# Patient Record
Sex: Female | Born: 1955
Health system: Southern US, Community
[De-identification: ages and names within clinical notes are randomized; demographics above are authoritative.]

## PROBLEM LIST (undated history)

## (undated) DIAGNOSIS — M1990 Unspecified osteoarthritis, unspecified site: Secondary | ICD-10-CM

## (undated) DIAGNOSIS — Z720 Tobacco use: Secondary | ICD-10-CM

## (undated) DIAGNOSIS — I639 Cerebral infarction, unspecified: Secondary | ICD-10-CM

## (undated) DIAGNOSIS — I10 Essential (primary) hypertension: Secondary | ICD-10-CM

## (undated) DIAGNOSIS — E785 Hyperlipidemia, unspecified: Secondary | ICD-10-CM

## (undated) DIAGNOSIS — F329 Major depressive disorder, single episode, unspecified: Secondary | ICD-10-CM

## (undated) DIAGNOSIS — K625 Hemorrhage of anus and rectum: Secondary | ICD-10-CM

## (undated) DIAGNOSIS — I219 Acute myocardial infarction, unspecified: Secondary | ICD-10-CM

## (undated) DIAGNOSIS — R112 Nausea with vomiting, unspecified: Secondary | ICD-10-CM

## (undated) DIAGNOSIS — F32A Depression, unspecified: Secondary | ICD-10-CM

## (undated) DIAGNOSIS — K5900 Constipation, unspecified: Secondary | ICD-10-CM

## (undated) DIAGNOSIS — I2510 Atherosclerotic heart disease of native coronary artery without angina pectoris: Secondary | ICD-10-CM

## (undated) DIAGNOSIS — F1910 Other psychoactive substance abuse, uncomplicated: Secondary | ICD-10-CM

## (undated) HISTORY — PX: FINGER FRACTURE SURGERY: SHX638

## (undated) HISTORY — DX: Hyperlipidemia, unspecified: E78.5

## (undated) HISTORY — PX: OTHER SURGICAL HISTORY: SHX169

---

## 1978-04-13 HISTORY — PX: TONSILLECTOMY: SUR1361

## 1983-04-14 HISTORY — PX: ABDOMINAL HYSTERECTOMY: SHX81

## 1997-08-06 ENCOUNTER — Emergency Department (HOSPITAL_COMMUNITY): Admission: EM | Admit: 1997-08-06 | Discharge: 1997-08-06 | Payer: Self-pay | Admitting: Emergency Medicine

## 1997-08-15 ENCOUNTER — Emergency Department (HOSPITAL_COMMUNITY): Admission: EM | Admit: 1997-08-15 | Discharge: 1997-08-15 | Payer: Self-pay | Admitting: Emergency Medicine

## 1998-04-13 DIAGNOSIS — F32A Depression, unspecified: Secondary | ICD-10-CM | POA: Insufficient documentation

## 1998-04-13 DIAGNOSIS — F329 Major depressive disorder, single episode, unspecified: Secondary | ICD-10-CM | POA: Insufficient documentation

## 2005-04-13 HISTORY — PX: OTHER SURGICAL HISTORY: SHX169

## 2006-05-23 ENCOUNTER — Emergency Department (HOSPITAL_COMMUNITY): Admission: EM | Admit: 2006-05-23 | Discharge: 2006-05-23 | Payer: Self-pay | Admitting: Emergency Medicine

## 2007-04-14 DIAGNOSIS — I10 Essential (primary) hypertension: Secondary | ICD-10-CM | POA: Insufficient documentation

## 2007-07-13 ENCOUNTER — Emergency Department (HOSPITAL_COMMUNITY): Admission: EM | Admit: 2007-07-13 | Discharge: 2007-07-13 | Payer: Self-pay | Admitting: Emergency Medicine

## 2007-07-18 ENCOUNTER — Encounter: Admission: RE | Admit: 2007-07-18 | Discharge: 2007-08-18 | Payer: Self-pay | Admitting: Orthopedic Surgery

## 2007-08-23 ENCOUNTER — Encounter: Admission: RE | Admit: 2007-08-23 | Discharge: 2007-10-04 | Payer: Self-pay | Admitting: Orthopedic Surgery

## 2008-01-12 ENCOUNTER — Ambulatory Visit: Payer: Self-pay | Admitting: Nurse Practitioner

## 2008-01-12 DIAGNOSIS — R229 Localized swelling, mass and lump, unspecified: Secondary | ICD-10-CM | POA: Insufficient documentation

## 2008-01-12 DIAGNOSIS — G4700 Insomnia, unspecified: Secondary | ICD-10-CM | POA: Insufficient documentation

## 2008-01-12 DIAGNOSIS — L723 Sebaceous cyst: Secondary | ICD-10-CM | POA: Insufficient documentation

## 2008-01-12 DIAGNOSIS — F17200 Nicotine dependence, unspecified, uncomplicated: Secondary | ICD-10-CM | POA: Insufficient documentation

## 2008-01-17 ENCOUNTER — Encounter (INDEPENDENT_AMBULATORY_CARE_PROVIDER_SITE_OTHER): Payer: Self-pay | Admitting: Nurse Practitioner

## 2008-01-17 ENCOUNTER — Encounter: Admission: RE | Admit: 2008-01-17 | Discharge: 2008-01-17 | Payer: Self-pay | Admitting: Family Medicine

## 2008-01-19 LAB — CONVERTED CEMR LAB
Cholesterol: 176 mg/dL
Glucose, Bld: 69 mg/dL

## 2008-02-01 ENCOUNTER — Ambulatory Visit: Payer: Self-pay | Admitting: Nurse Practitioner

## 2008-03-01 ENCOUNTER — Ambulatory Visit: Payer: Self-pay | Admitting: Nurse Practitioner

## 2008-03-01 ENCOUNTER — Encounter (INDEPENDENT_AMBULATORY_CARE_PROVIDER_SITE_OTHER): Payer: Self-pay | Admitting: Nurse Practitioner

## 2008-03-01 ENCOUNTER — Encounter (INDEPENDENT_AMBULATORY_CARE_PROVIDER_SITE_OTHER): Payer: Self-pay | Admitting: Internal Medicine

## 2008-03-01 DIAGNOSIS — T7411XA Adult physical abuse, confirmed, initial encounter: Secondary | ICD-10-CM | POA: Insufficient documentation

## 2008-03-01 DIAGNOSIS — D179 Benign lipomatous neoplasm, unspecified: Secondary | ICD-10-CM | POA: Insufficient documentation

## 2008-03-01 LAB — CONVERTED CEMR LAB
Bilirubin Urine: NEGATIVE
Blood in Urine, dipstick: NEGATIVE
Glucose, Urine, Semiquant: NEGATIVE
KOH Prep: NEGATIVE
Ketones, urine, test strip: NEGATIVE
Nitrite: NEGATIVE
Protein, U semiquant: NEGATIVE
Specific Gravity, Urine: 1.01
Urobilinogen, UA: 0.2
WBC Urine, dipstick: NEGATIVE
pH: 7

## 2008-03-05 ENCOUNTER — Encounter (INDEPENDENT_AMBULATORY_CARE_PROVIDER_SITE_OTHER): Payer: Self-pay | Admitting: Nurse Practitioner

## 2008-03-05 LAB — CONVERTED CEMR LAB
ALT: 23 units/L (ref 0–35)
AST: 28 units/L (ref 0–37)
Albumin: 4 g/dL (ref 3.5–5.2)
Alkaline Phosphatase: 71 units/L (ref 39–117)
BUN: 17 mg/dL (ref 6–23)
Basophils Absolute: 0 10*3/uL (ref 0.0–0.1)
Basophils Relative: 0 % (ref 0–1)
CO2: 26 meq/L (ref 19–32)
Calcium: 10.2 mg/dL (ref 8.4–10.5)
Chlamydia, DNA Probe: NEGATIVE
Chloride: 103 meq/L (ref 96–112)
Cholesterol: 189 mg/dL (ref 0–200)
Creatinine, Ser: 0.78 mg/dL (ref 0.40–1.20)
Eosinophils Absolute: 0.3 10*3/uL (ref 0.0–0.7)
Eosinophils Relative: 6 % — ABNORMAL HIGH (ref 0–5)
GC Probe Amp, Genital: NEGATIVE
Glucose, Bld: 118 mg/dL — ABNORMAL HIGH (ref 70–99)
HCT: 40.4 % (ref 36.0–46.0)
HDL: 60 mg/dL (ref >39)
Hemoglobin: 13.1 g/dL (ref 12.0–15.0)
LDL Cholesterol: 111 mg/dL — ABNORMAL HIGH (ref 0–99)
Lymphocytes Relative: 38 % (ref 12–46)
Lymphs Abs: 2.1 10*3/uL (ref 0.7–4.0)
MCHC: 32.4 g/dL (ref 30.0–36.0)
MCV: 91.8 fL (ref 78.0–100.0)
Microalb, Ur: 0.2 mg/dL (ref 0.00–1.89)
Monocytes Absolute: 0.7 10*3/uL (ref 0.1–1.0)
Monocytes Relative: 13 % — ABNORMAL HIGH (ref 3–12)
Neutro Abs: 2.4 10*3/uL (ref 1.7–7.7)
Neutrophils Relative %: 44 % (ref 43–77)
Platelets: 272 10*3/uL (ref 150–400)
Potassium: 4.6 meq/L (ref 3.5–5.3)
RBC: 4.4 M/uL (ref 3.87–5.11)
RDW: 14.7 % (ref 11.5–15.5)
Sodium: 140 meq/L (ref 135–145)
TSH: 1.944 microintl units/mL (ref 0.350–4.50)
Total Bilirubin: 0.3 mg/dL (ref 0.3–1.2)
Total CHOL/HDL Ratio: 3.2
Total Protein: 7.3 g/dL (ref 6.0–8.3)
Triglycerides: 91 mg/dL (ref <150)
VLDL: 18 mg/dL (ref 0–40)
WBC: 5.5 10*3/uL (ref 4.0–10.5)

## 2008-03-06 ENCOUNTER — Ambulatory Visit (HOSPITAL_COMMUNITY): Admission: RE | Admit: 2008-03-06 | Discharge: 2008-03-06 | Payer: Self-pay | Admitting: Internal Medicine

## 2008-03-07 ENCOUNTER — Encounter (INDEPENDENT_AMBULATORY_CARE_PROVIDER_SITE_OTHER): Payer: Self-pay | Admitting: Nurse Practitioner

## 2008-03-22 ENCOUNTER — Ambulatory Visit: Payer: Self-pay | Admitting: Internal Medicine

## 2008-03-30 ENCOUNTER — Ambulatory Visit: Payer: Self-pay | Admitting: Internal Medicine

## 2008-05-15 ENCOUNTER — Ambulatory Visit: Payer: Self-pay | Admitting: Nurse Practitioner

## 2008-05-17 ENCOUNTER — Ambulatory Visit: Payer: Self-pay | Admitting: Nurse Practitioner

## 2008-06-20 ENCOUNTER — Encounter (INDEPENDENT_AMBULATORY_CARE_PROVIDER_SITE_OTHER): Payer: Self-pay | Admitting: Internal Medicine

## 2008-09-19 ENCOUNTER — Telehealth (INDEPENDENT_AMBULATORY_CARE_PROVIDER_SITE_OTHER): Payer: Self-pay | Admitting: Nurse Practitioner

## 2008-09-21 ENCOUNTER — Telehealth (INDEPENDENT_AMBULATORY_CARE_PROVIDER_SITE_OTHER): Payer: Self-pay | Admitting: Nurse Practitioner

## 2008-12-13 ENCOUNTER — Ambulatory Visit: Payer: Self-pay | Admitting: Nurse Practitioner

## 2008-12-13 DIAGNOSIS — M719 Bursopathy, unspecified: Secondary | ICD-10-CM | POA: Insufficient documentation

## 2008-12-13 DIAGNOSIS — M67919 Unspecified disorder of synovium and tendon, unspecified shoulder: Secondary | ICD-10-CM | POA: Insufficient documentation

## 2009-01-11 ENCOUNTER — Ambulatory Visit: Payer: Self-pay | Admitting: Nurse Practitioner

## 2009-01-11 DIAGNOSIS — J00 Acute nasopharyngitis [common cold]: Secondary | ICD-10-CM | POA: Insufficient documentation

## 2009-01-18 ENCOUNTER — Encounter: Admission: RE | Admit: 2009-01-18 | Discharge: 2009-01-18 | Payer: Self-pay | Admitting: Internal Medicine

## 2009-01-25 ENCOUNTER — Ambulatory Visit: Payer: Self-pay | Admitting: Nurse Practitioner

## 2010-04-13 HISTORY — PX: CORONARY ANGIOPLASTY WITH STENT PLACEMENT: SHX49

## 2010-04-25 ENCOUNTER — Emergency Department (HOSPITAL_COMMUNITY)
Admission: EM | Admit: 2010-04-25 | Discharge: 2010-04-25 | Payer: Self-pay | Source: Home / Self Care | Admitting: Family Medicine

## 2010-05-13 NOTE — Letter (Signed)
Summary: MAILED REQUESTED RECORDS TO PATIENT  MAILED REQUESTED RECORDS TO PATIENT   Imported By: Arta Bruce 06/20/2008 14:31:47  _____________________________________________________________________  External Attachment:    Type:   Image     Comment:   External Document

## 2010-05-13 NOTE — Assessment & Plan Note (Signed)
Summary: Acute Nasopharyngitis   Vital Signs:  Patient profile:   55 year old female Weight:      160.2 pounds BMI:     29.87 BSA:     1.73 O2 Sat:      97 % on Room air Temp:     97.9 degrees F oral Pulse rate:   760 / minute Pulse rhythm:   regular BP sitting:   134 / 86  (left arm) Cuff size:   regular  Vitals Entered By: Levon Hedger (January 11, 2009 3:42 PM)  O2 Flow:  Room air CC: cough x 2 weeks,  wheezing, chest congestion,  alot of coughing Is Patient Diabetic? No Pain Assessment Patient in pain? yes     Location: back  Does patient need assistance? Functional Status Self care Ambulation Normal   CC:  cough x 2 weeks, wheezing, chest congestion, and alot of coughing.  History of Present Illness:  Pt into the office with sob and cough. +cough -fever +scratchy throat initially Symptoms were worse on last week but have improved at this time. Tobacco - continues  Pt presents today with all her medications  Habits & Providers  Alcohol-Tobacco-Diet     Alcohol drinks/day: 0     Tobacco Status: current     Tobacco Counseling: to quit use of tobacco products     Cigarette Packs/Day: 5 cigs per day     Year Started: age 1  Exercise-Depression-Behavior     Does Patient Exercise: yes     Exercise Counseling: to improve exercise regimen     Type of exercise: walking     Exercise (avg: min/session): <30     Times/week: 5  Allergies (verified): 1)  ! Bactrim  Review of Systems General:  Denies fever. ENT:  Complains of sore throat; sratchy throat initially. CV:  Denies chest pain or discomfort and fatigue. Resp:  Complains of cough. GI:  Denies abdominal pain, nausea, and vomiting. MS:  Complains of joint pain; right arm pain - occuring more frequently.  usually starts mid arm and radiates down to the hand.  Physical Exam  General:  alert.   Head:  normocephalic.   Ears:  ear piercing(s) noted.   Mouth:  poor dentation Neck:  supple.     Lungs:  normal breath sounds.   Heart:  normal rate and regular rhythm.   Abdomen:  normal bowel sounds.   Msk:  normal ROM.   Neurologic:  alert & oriented X3.   Psych:  speaks with eyes closed   Impression & Recommendations:  Problem # 1:  ACUTE NASOPHARYNGITIS (ICD-460) lungs sound clear today advised pt to get mucinex  Her updated medication list for this problem includes:    Ibuprofen 800 Mg Tabs (Ibuprofen) ..... One tablet by mouth two times a day as needed for pain  Problem # 2:  TOBACCO ABUSE (ICD-305.1) advised cessation  Problem # 3:  BURSITIS, RIGHT SHOULDER (ICD-726.10)  Pt has repeative job activites that likely trigger symptoms in right arm. F/u in 2 weeks for the flu vaccine  Complete Medication List: 1)  Maxzide-25 37.5-25 Mg Tabs (Triamterene-hctz) .Marland Kitchen.. 1 tablet by mouth daily for blood pressure 2)  Trazodone Hcl 100 Mg Tabs (Trazodone hcl) .Marland Kitchen.. 1 tablet by mouth nightly as needed for sleep 3)  Celexa 40 Mg Tabs (Citalopram hydrobromide) .Marland Kitchen.. 1 tablet by mouth daily for mood 4)  Ibuprofen 800 Mg Tabs (Ibuprofen) .... One tablet by mouth two times a day as  needed for pain  Patient Instructions: 1)  Get Mucinex - One tablet by mouth two times a day until symptoms resolve 2)  Right arm - get a sleeve for your right elbow, this will give you some support. 3)  Follow up as needed

## 2010-05-13 NOTE — Assessment & Plan Note (Signed)
Summary: Right shoulder Bursitis   Vital Signs:  Patient profile:   55 year old female Weight:      155 pounds BMI:     28.90 BSA:     1.71 Temp:     97.8 degrees F oral Pulse rate:   72 / minute Pulse rhythm:   regular Resp:     16 per minute BP sitting:   142 / 89  (left arm) Cuff size:   regular  Vitals Entered By: Levon Hedger (December 13, 2008 3:09 PM) CC: pt has been experiencing pain in her right shoulder since Feb and in the past 2 weeks it is getting worse and it brings her to see, Hypertension Management, Depression Is Patient Diabetic? No Pain Assessment Patient in pain? yes     Location: right arm Intensity: 6  Does patient need assistance? Functional Status Self care Ambulation Normal   CC:  pt has been experiencing pain in her right shoulder since Feb and in the past 2 weeks it is getting worse and it brings her to see, Hypertension Management, and Depression.  History of Present Illness:  Pt into the office with right shoulder/arm pain. Started in Feb 2010. Pain has intensififed.  Avoids sleeping on the right shoulder Pain occurs as the day progresses.  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  Hypertension History:      She denies headache, chest pain, and palpitations.  No medications present with pt today.  Reports she is taking daily.        Positive major cardiovascular risk factors include female age 106 years old or older, hypertension, family history for ischemic heart disease (males less than 18 years old), and current tobacco user.        Further assessment for target organ damage reveals no history of ASHD, cardiac end-organ damage (CHF/LVH), stroke/TIA, peripheral vascular disease, renal insufficiency, or  hypertensive retinopathy.     Habits & Providers  Alcohol-Tobacco-Diet     Alcohol drinks/day: 0     Tobacco Status: current     Tobacco Counseling: to quit use of tobacco products     Cigarette Packs/Day: 5 cigs per day     Year Started: age 84  Exercise-Depression-Behavior     Does Patient Exercise: yes     Exercise Counseling: to improve exercise regimen     Type of exercise: walking     Exercise (avg: min/session): <30     Times/week: 5     Have you felt down or hopeless? no     Have you felt little pleasure in things? no     Drug Use: yes  Allergies (verified): 1)  ! Bactrim  Social History: Packs/Day:  5 cigs per day Does Patient Exercise:  yes  Review of Systems MS:  right shoulder pain.  Physical Exam  General:  short hair Head:  glasses Ears:  ear piercing(s) noted.   Mouth:  poor dentation Lungs:  normal breath sounds.   Heart:  normal rate and regular rhythm.     Shoulder/Elbow Exam  Shoulder Exam:    Right:    Inspection:  Normal       Location:  right AC joint    Stability:  stable    Tenderness:  no    Swelling:  no    Erythema:  no   Impression & Recommendations:  Problem # 1:  BURSITIS, RIGHT SHOULDER (ICD-726.10) handout given prednisone taper  Problem # 2:  HYPERTENSION, BENIGN ESSENTIAL (ICD-401.1) BP is elevated today advised pt to monitor diet - DASH Her updated medication list for this problem includes:    Maxzide-25 37.5-25 Mg Tabs (Triamterene-hctz) .Marland Kitchen... 1 tablet by mouth daily for blood pressure  Problem # 3:  TOBACCO ABUSE (ICD-305.1) advised cessation  Problem # 4:  DEPRESSION (ICD-311) continue f/u at the [facility] Her updated medication list for this problem includes:    Trazodone Hcl 100 Mg Tabs (Trazodone hcl) .Marland Kitchen... 1 tablet by mouth nightly as needed for sleep    Celexa 40 Mg Tabs (Citalopram hydrobromide) .Marland Kitchen... 1 tablet by mouth daily for mood  Complete Medication List: 1)  Maxzide-25 37.5-25 Mg Tabs  (Triamterene-hctz) .Marland Kitchen.. 1 tablet by mouth daily for blood pressure 2)  Trazodone Hcl 100 Mg Tabs (Trazodone hcl) .Marland Kitchen.. 1 tablet by mouth nightly as needed for sleep 3)  Celexa 40 Mg Tabs (Citalopram hydrobromide) .Marland Kitchen.. 1 tablet by mouth daily for mood 4)  Prednisone 10 Mg Tabs (Prednisone) .... 30mg  x 2 days, 20mg  x 2 days, 10mg  x 2 days for inflammation 5)  Ibuprofen 800 Mg Tabs (Ibuprofen) .... One tablet by mouth two times a day as needed for pain  Hypertension Assessment/Plan:      The patient's hypertensive risk group is category B: At least one risk factor (excluding diabetes) with no target organ damage.  Her calculated 10 year risk of coronary heart disease is 11 %.  Today's blood pressure is 142/89.  Her blood pressure goal is < 140/90.   Patient Instructions: 1)  Schedule your complete physical exam AFTER November 19th. 2)  You will need mammogram, rectal, PHQ-9, fasting labs 3)  Do not eat after midnight before this appointment. 4)  If Blood pressure is still elevated on next visit you will need medications changed 5)  Right shoulder - most likely bursitis. 6)  Read handouts 7)  Prednisone taper 8)  Friday (3 tabs) 9)  Saturday (3 tabs) 10)  Sunday (2 tabs) 11)  Monday (2 tabs) 12)  Tuesday (1 tab) 13)  Wednesday (1 tab) 14)  Take ibuprofen 800mg  by mouth two times a day as needed for pain (take with food) 15)  You need to schedule an eligibility appointment. Prescriptions: IBUPROFEN 800 MG TABS (IBUPROFEN) One tablet by mouth two times a day as needed for pain  #50 x 0   Entered and Authorized by:   Lehman Prom FNP   Signed by:   Lehman Prom FNP on 12/13/2008   Method used:   Print then Give to Patient   RxID:   [id number] PREDNISONE 10 MG TABS (PREDNISONE) 30mg  x 2 days, 20mg  x 2 days, 10mg  x 2 days for inflammation  #12 x 0   Entered and Authorized by:   Lehman Prom FNP   Signed by:   Lehman Prom FNP on 12/13/2008   Method used:   Print then Give  to Patient   RxID:   [id number]

## 2010-05-13 NOTE — Assessment & Plan Note (Signed)
Summary: SUTURE REMOVAL//[facility]  Nurse Visit    Prior Medications: MAXZIDE-25 37.5-25 MG TABS (TRIAMTERENE-HCTZ) 1 tablet by mouth daily for blood pressure TRAZODONE HCL 100 MG TABS (TRAZODONE HCL) 1 tablet by mouth nightly as needed for sleep CELEXA 40 MG TABS (CITALOPRAM HYDROBROMIDE) 1 tablet by mouth daily for mood Current Allergies: ! BACTRIM    Orders Added: 1)  Est. Patient Nurse visit [09003]    ]

## 2010-05-13 NOTE — Letter (Signed)
Summary: *HSN Results Follow up  [facility]  [address] [place], [place] [address]   Phone: [phone]  Fax: [phone]      03/05/2008   [name] [place], [place]  [address]   Dear  Ms. Vanessa Proctor,                            ____S.Drinkard,FNP   ____D. Gore,FNP       ____B. McPherson,MD   ____V. Rankins,MD    ____E. Mulberry,MD    __X__N. Daphine Deutscher, FNP  ____D. Reche Dixon, MD    ____K. Philipp Deputy, MD    ____Other     This letter is to inform you that your recent test(s):  ___X____Pap Smear    ___X____Lab Test     _______X-ray    _______ is within acceptable limits  _______ requires a medication change  _______ requires a follow-up lab visit  ___X____ requires a follow-up lab visit   Comments:  Your blood sugar was slightly elevated during your recent office visit.  Schedule a follow up nurse - lab visit to get your blood sugar re-checked. Do not eat after midnight before this appointment. Pap Smear results _________________________________       _________________________________________________________ If you have any questions, please contact our office [phone].                    Sincerely,    Lehman Prom FNP [facility]

## 2010-05-13 NOTE — Letter (Signed)
Summary: REQUESTING RECORDS FROM [facility]  REQUESTING RECORDS FROM [facility]   Imported By: Arta Bruce 02/06/2008 15:41:44  _____________________________________________________________________  External Attachment:    Type:   Image     Comment:   External Document

## 2010-05-13 NOTE — Assessment & Plan Note (Signed)
Summary: Sebaceous Cyst   Vital Signs:  Patient Profile:   55 Years Old Female Height:     61.52 inches (156.25 cm) Weight:      150 pounds BMI:     27.97 BSA:     1.68 Temp:     97.5 degrees F oral Pulse rate:   72 / minute Pulse rhythm:   regular Resp:     16 per minute BP sitting:   140 / 80  (left arm) Cuff size:   regular  Pt. in pain?   yes  Vitals Entered By: Levon Hedger (February 01, 2008 2:13 PM)              Is Patient Diabetic? No  Does patient need assistance? Ambulation Normal     Chief Complaint:  2 week follow-up for cyst.  History of Present Illness:  Pt went to get the mammogram as ordered after last visit. Dx was sebaceous cyst. She was instructed to apply warm compresses to the area.  She did for about 3 times but she was not able to tolerate. Pt has also gone to a community clinic in which her blood pressure, cholesterol, and blood sugar was done.  She also had HIV and RPR tested.  She will get those results back next week.  Hypertension History:      She denies headache, chest pain, and palpitations.  She notes no problems with any antihypertensive medication side effects.        Positive major cardiovascular risk factors include hypertension, family history for ischemic heart disease (males less than 58 years old), and current tobacco user.  Negative major cardiovascular risk factors include female age less than 26 years old.       Prior Medications Reviewed Using: Medication Bottles  Updated Prior Medication List: MAXZIDE-25 37.5-25 MG TABS (TRIAMTERENE-HCTZ) 1 tablet by mouth daily for blood pressure TRAZODONE HCL 100 MG TABS (TRAZODONE HCL) 1 tablet by mouth nightly as needed for sleep CELEXA 40 MG TABS (CITALOPRAM HYDROBROMIDE) 1 tablet by mouth daily for mood  Current Allergies (reviewed today): ! BACTRIM    Risk Factors:  Tobacco use:  current    Year started:  age 24    Cigarettes:  Yes    Counseled to quit/cut down  tobacco use:  yes Drug use:  no Alcohol use:  no  Family History Risk Factors:    Family History of MI in females < 43 years old:  no    Family History of MI in males < 36 years old:  yes  Mammogram History:    Date of Last Mammogram:  01/17/2008  @  [facility]   Review of Systems  Derm      Complains of lesion(s).      right scapula, left axilla   Physical Exam  General:     alert.   Head:     normocephalic.   Eyes:     glasses Lungs:     normal breath sounds.   Heart:     normal rate and regular rhythm.   Neurologic:     alert & oriented X3.   Skin:     1cm x 1.5 circumscribed cyst, indented center - right scapula  left axilla - 1.5x2cm firm, circumscribed cyst    Impression & Recommendations:  Problem # 1:  SEBACEOUS CYST (ICD-706.2) right scapula and left axilla (per mammogram)  will schedule I&D  Problem # 2:  HYPERTENSION, BENIGN ESSENTIAL (ICD-401.1) Will refill meds. Her  updated medication list for this problem includes:    Maxzide-25 37.5-25 Mg Tabs (Triamterene-hctz) .Marland Kitchen... 1 tablet by mouth daily for blood pressure   Complete Medication List: 1)  Maxzide-25 37.5-25 Mg Tabs (Triamterene-hctz) .Marland Kitchen.. 1 tablet by mouth daily for blood pressure 2)  Trazodone Hcl 100 Mg Tabs (Trazodone hcl) .Marland Kitchen.. 1 tablet by mouth nightly as needed for sleep 3)  Celexa 40 Mg Tabs (Citalopram hydrobromide) .Marland Kitchen.. 1 tablet by mouth daily for mood 4)  Keflex 500 Mg Caps (Cephalexin) .Marland Kitchen.. 1 capsule by mouth three times a day for infection  Hypertension Assessment/Plan:      The patient's hypertensive risk group is category B: At least one risk factor (excluding diabetes) with no target organ damage.  Today's blood pressure is 140/80.  Her blood pressure goal is < 140/90.   Patient Instructions: 1)  Schedule I&D of right scapula and left axilla by Dr. Delrae Alfred. 2)  Then you will need to schedule complete physical exam with N.Martin, fnp 3)  Continue medications as  ordered   Prescriptions: KEFLEX 500 MG CAPS (CEPHALEXIN) 1 capsule by mouth three times a day for infection  #30 x 0   Entered and Authorized by:   Lehman Prom FNP   Signed by:   Lehman Prom FNP on 02/01/2008   Method used:   Print then Give to Patient   RxID:   [id number] MAXZIDE-25 37.5-25 MG TABS (TRIAMTERENE-HCTZ) 1 tablet by mouth daily for blood pressure  #30 x 3   Entered and Authorized by:   Lehman Prom FNP   Signed by:   Lehman Prom FNP on 02/01/2008   Method used:   Print then Give to Patient   RxID:   [id number]  ]

## 2010-05-13 NOTE — Letter (Signed)
Summary: Handout Printed  Printed Handout:  - Bursitis 

## 2010-05-13 NOTE — Assessment & Plan Note (Signed)
Summary: Complete Physical Exam   Vital Signs:  Patient Profile:   55 Years Old Female Height:     61.52 inches (156.25 cm) Weight:      152 pounds BMI:     28.34 BSA:     1.69 Temp:     97.1 degrees F oral Pulse rate:   64 / minute Pulse rhythm:   regular Resp:     16 per minute BP sitting:   120 / 80  (left arm) Cuff size:   regular  Pt. in pain?   yes    Location:   back  Vitals Entered By: Levon Hedger (March 01, 2008 8:42 AM)              Is Patient Diabetic? No  Does patient need assistance? Ambulation Normal     Chief Complaint:  Complete physical Exam.  History of Present Illness:  Patient into the office for Complete Physical Exam.  Left upper chest - area of concern that has been present over 1 year.  She notes that area is increasing in size.  No pain.  Area does not bother her but other people notice it.    Optho - Pt wears glasses.  last eye exam was 3 years ago.  She is supposed to wear glasses to drive.  Dental - No recent dental exam. She has several teeth that are broken or shipped.  She has a partial for the bottom but she does not wear them because she has lost weight and it does not fit appropriately.  Previous visit pt noted to have sebaceous cyst which she will return for procedure.  Depression History:      Positive alarm features for depression include insomnia.  However, she denies recurrent thoughts of death or suicide.        Psychosocial stress factors include major life changes.  The patient denies that she feels like life is not worth living, denies that she wishes that she were dead, and denies that she has thought about ending her life.        Comments:  Pt had a recent visit with Dr. Abbe Amsterdam at the [facility].  Her next visit will be in 05/2008. Pt recently lost her apartment and is now living with her daughter.  .  Hypertension History:      She denies headache, chest pain, and palpitations.  She notes no problems with  any antihypertensive medication side effects.        Positive major cardiovascular risk factors include hypertension, family history for ischemic heart disease (males less than 57 years old), and current tobacco user.  Negative major cardiovascular risk factors include female age less than 57 years old.        Further assessment for target organ damage reveals no history of ASHD, cardiac end-organ damage (CHF/LVH), stroke/TIA, peripheral vascular disease, renal insufficiency, or hypertensive retinopathy.        Prior Medications Reviewed Using: Patient Recall  Prior Medication List:  MAXZIDE-25 37.5-25 MG TABS (TRIAMTERENE-HCTZ) 1 tablet by mouth daily for blood pressure TRAZODONE HCL 100 MG TABS (TRAZODONE HCL) 1 tablet by mouth nightly as needed for sleep CELEXA 40 MG TABS (CITALOPRAM HYDROBROMIDE) 1 tablet by mouth daily for mood KEFLEX 500 MG CAPS (CEPHALEXIN) 1 capsule by mouth three times a day for infection   Updated Prior Medication List: MAXZIDE-25 37.5-25 MG TABS (TRIAMTERENE-HCTZ) 1 tablet by mouth daily for blood pressure TRAZODONE HCL 100 MG TABS (TRAZODONE HCL) 1 tablet  by mouth nightly as needed for sleep CELEXA 40 MG TABS (CITALOPRAM HYDROBROMIDE) 1 tablet by mouth daily for mood KEFLEX 500 MG CAPS (CEPHALEXIN) 1 capsule by mouth three times a day for infection  Current Allergies (reviewed today): ! BACTRIM    Risk Factors:  Tobacco use:  current    Year started:  age 2    Cigarettes:  Yes Drug use:  yes    Substance:  marijuana    Comments:  occasional Alcohol use:  no  Family History Risk Factors:    Family History of MI in females < 5 years old:  no    Family History of MI in males < 63 years old:  yes  Mammogram History:    Date of Last Mammogram:  01/17/2008  @  [facility]   Review of Systems  General      Denies fatigue.  Eyes      Complains of blurring.      at times  ENT      Denies earache.  CV      Denies chest pain or discomfort and  fatigue.  Resp      Denies cough.  GI      Denies abdominal pain, nausea, and vomiting.  GU      Denies discharge.  MS      Denies joint pain.  Derm      Complains of lesion(s).      left axilla, right scapula  Neuro      Denies headaches.  Psych      Denies depression.  Endo      Denies excessive urination.  Heme      Denies bleeding.   Physical Exam  General:     alert.   Head:     normocephalic.   Eyes:     vision grossly intact, pupils equal, and pupils round.   Ears:     ear piercing(s) noted.   right ear with cerumen - moderate;  left ear with minimal cerumen Nose:     no nasal discharge.   Mouth:     poor dentation, discoloration and broken teeth upper partial Neck:     supple.   Chest Wall:     no mass.   Breasts:     no masses and no abnormal thickening.   Lungs:     normal breath sounds.   Heart:     normal rate, regular rhythm, no murmur, and no gallop.   Abdomen:     soft, non-tender, and normal bowel sounds.   healed incision from TAH striae Msk:     up to the exam table Pulses:     R radial normal, R dorsalis pedis normal, L radial normal, and L dorsalis pedis normal.   Extremities:     no edema Neurologic:     alert & oriented X3 and gait normal.   Skin:     left clavicle - fatty tissue, nontender nevi to chest  Psych:     speaks at times with her eyes closed    Impression & Recommendations:  Problem # 1:  ROUTINE GYNECOLOGICAL EXAMINATION (ICD-V72.31) labs done vaginal exam done recommend optho and dental exam mammogram up to date.  pt already has self breast placcard tdap given Orders: UA Dipstick w/o Micro (manual) (37628) Pap Smear, Thin Prep ( Collection of) (B1517) Hemoccult Guaiac-1 spec.(in office) (61607) T-General Health Panel (CBCD, CMP, TSH) ([address]) T-Lipid Profile (85462-70350) T-HIV Antibody  (Reflex) (09381-82993) T-Syphilis Test (  RPR) (41324-40102) T-Urine Microalbumin w/creat. ratio  [phone] / [id number]) T- GC Chlamydia (59563)   Problem # 2:  HYPERTENSION, BENIGN ESSENTIAL (ICD-401.1) BP stable. Her updated medication list for this problem includes:    Maxzide-25 37.5-25 Mg Tabs (Triamterene-hctz) .Marland Kitchen... 1 tablet by mouth daily for blood pressure  Orders: T-General Health Panel (CBCD, CMP, TSH) ([address]) T-Lipid Profile (51884-16606)   Problem # 3:  SEBACEOUS CYST (ICD-706.2) pt scheduled for removal by Dr. Delrae Alfred  Problem # 4:  MASS, LEFT AXILLA (ICD-782.2) Dr. Delrae Alfred to assess but he may need surgical referral  Problem # 5:  DEPRESSION (ICD-311) pt to continue f/u at [facility] Her updated medication list for this problem includes:    Trazodone Hcl 100 Mg Tabs (Trazodone hcl) .Marland Kitchen... 1 tablet by mouth nightly as needed for sleep    Celexa 40 Mg Tabs (Citalopram hydrobromide) .Marland Kitchen... 1 tablet by mouth daily for mood   Complete Medication List: 1)  Maxzide-25 37.5-25 Mg Tabs (Triamterene-hctz) .Marland Kitchen.. 1 tablet by mouth daily for blood pressure 2)  Trazodone Hcl 100 Mg Tabs (Trazodone hcl) .Marland Kitchen.. 1 tablet by mouth nightly as needed for sleep 3)  Celexa 40 Mg Tabs (Citalopram hydrobromide) .Marland Kitchen.. 1 tablet by mouth daily for mood  Other Orders: Ultrasound (Ultrasound) Tdap => 16yrs IM (30160) Admin 1st Vaccine (10932)  Hypertension Assessment/Plan:      The patient's hypertensive risk group is category B: At least one risk factor (excluding diabetes) with no target organ damage.  Today's blood pressure is 120/80.  Her blood pressure goal is < 140/90.  Colorectal Screening:  Current Recommendations:    Hemoccult: Given x 3    Colonoscopy recommended: patient defers today but will consider in the future  Colonoscopy Comments:    Pt never had a colonscopy.   No blood noted in stool. BM about every couple of days.  Stools are soft at time  PAP Screening:    Reviewed PAP smear recommendations:  PAP smear done  PAP Smear Results:  PAP Smear Comments:     Partial hysterectomy in 1985.  TAH.  Ovaries remain. she had fibroids.  Mammogram Screening:    Last Mammogram:  01/17/2008    Reviewed Mammogram recommendations:  mammogram not due yet  Mammogram Results:  Mammogram Comments:    pt had mammogram last month.  Osteoporosis Risk Assessment:  Risk Factors for Fracture or Low Bone Density:   Race (White or Asian):     no   Hx of Fractures:       no   FH of Osteoporosis:     no   Hx of falls:       no   Physically inactive:     yes   Smoking status:       current   High alcohol use:     no   High caffeine use:     no   Low calcium/Vit. D intake:   no   Corticosteroid use:     no   Thyroid replacement:     no   Dilantin use:       no   Heparin use:       no   Thyroid disease:     no   Rheumatoid Arthritis:     no   Parathyroid disease:     no  Bone Density (DEXA Scan) Results:  Bone Density Comments:    Pt is postmenopausal - surgical.  Last menses at the age of 63.  Immunization &  Chemoprophylaxis:    Tetanus vaccine: Tdap  (03/01/2008)   Patient Instructions: 1)  You will be notified of any abnormal labs. 2)  Get ultrasound of left upper chest as ordered.  You will be informed of the results. 3)  Follow up in 3 months (February) for blood pressure and regular office visit. 4)  Keep scheduled appointment for removal or right scapula cyst.  She will assess left axilla but you may need surgerical referral for that.   ] Laboratory Results   Urine Tests  Date/Time Received: March 01, 2008 9:04 AM  Date/Time Reported: March 01, 2008 9:04 AM   Routine Urinalysis   Color: lt. yellow Appearance: Clear Glucose: negative   (Normal Range: Negative) Bilirubin: negative   (Normal Range: Negative) Ketone: negative   (Normal Range: Negative) Spec. Gravity: 1.010   (Normal Range: 1.003-1.035) Blood: negative   (Normal Range: Negative) pH: 7.0   (Normal Range: 5.0-8.0) Protein: negative   (Normal Range:  Negative) Urobilinogen: 0.2   (Normal Range: 0-1) Nitrite: negative   (Normal Range: Negative) Leukocyte Esterace: negative   (Normal Range: Negative)    Date/Time Received: March 01, 2008 9:57 AM   [place]/KOH Source: vaginal WBC/hpf: 1-5 Bacteria/hpf: rare Clue cells/hpf: none Yeast/hpf: few Trichomonas/hpf: none  Stool - Occult Blood Hemmoccult #1: negative Date: 03/01/2008    Laboratory Results   Urine Tests    Routine Urinalysis   Color: lt. yellow Appearance: Clear Glucose: negative   (Normal Range: Negative) Bilirubin: negative   (Normal Range: Negative) Ketone: negative   (Normal Range: Negative) Spec. Gravity: 1.010   (Normal Range: 1.003-1.035) Blood: negative   (Normal Range: Negative) pH: 7.0   (Normal Range: 5.0-8.0) Protein: negative   (Normal Range: Negative) Urobilinogen: 0.2   (Normal Range: 0-1) Nitrite: negative   (Normal Range: Negative) Leukocyte Esterace: negative   (Normal Range: Negative)      [place]/KOH KOH Negative    Tetanus/Td Vaccine    Vaccine Type: Tdap    Site: right deltoid    Mfr: Sanofi Pasteur    Dose: 0.5 ml    Route: IM    Given by: Levon Hedger    Exp. Date: 03/15/2010    Lot #: [id number]    VIS given: 03/01/07 version given March 01, 2008.    ndc  [id number]

## 2010-05-13 NOTE — Letter (Signed)
Summary: BEHAVIORAL GUIDELINE  BEHAVIORAL GUIDELINE   Imported By: Arta Bruce 01/12/2008 13:30:07  _____________________________________________________________________  External Attachment:    Type:   Image     Comment:   External Document

## 2010-05-13 NOTE — Letter (Signed)
Summary: *HSN Results Follow up  [facility]  [address] [place], [place] [address]   Phone: [phone]  Fax: [phone]      03/07/2008   MARNIE FAZZINO [address] [place], [place]  [address]   Dear  Ms. Freedom Durocher,                            ____S.Drinkard,FNP   ____D. Gore,FNP       ____B. McPherson,MD   ____V. Rankins,MD    ____E. Mulberry,MD    __X__N. Daphine Deutscher, FNP  ____D. Reche Dixon, MD    ____K. Philipp Deputy, MD    ____Other     This letter is to inform you that your recent test(s):  _______Pap Smear    _______Lab Test     ___X____X-ray    ___X____ is within acceptable limits  _______ requires a medication change  _______ requires a follow-up lab visit  _______ requires a follow-up visit with your provider   Comments: Ultrasound of left clavicle (left upper chest) was normal.       _________________________________________________________ If you have any questions, please contact our office [phone].                    Sincerely,    Lehman Prom FNP [facility]

## 2010-05-13 NOTE — Assessment & Plan Note (Signed)
Summary: NEW - Left Axilla mass   Vital Signs:  Patient Profile:   55 Years Old Female Height:     61.52 inches (156.25 cm) Weight:      146 pounds BMI:     27.22 BSA:     1.66 Temp:     97.1 degrees F oral Pulse rate:   70 / minute Pulse rhythm:   regular Resp:     18 per minute BP sitting:   126 / 78  (left arm) Cuff size:   regular  Pt. in pain?   yes    Location:   back    Intensity:   2    Type:       sharp              Is Patient Diabetic? Yes      Chief Complaint:  pt is here to check her lumps on right shoulder and under left arm.  History of Present Illness:  Pt into the office to establish care. Previously [facility] Hal [facility].  PMH - significant for HTN and depression  PSH - tonsillectomy, hysterecomy  Lump under left axilla - present for almost 1 year. it has grown in size. some occasional discomfort.  she did also have a boil many years ago which was I&D'd with packing. she does shave under her arms every 3 days. Deordarant - she wears lady speed stick  right shoulder with an area of concern - when she wears a bra this area gets irritated.  present also for 1 year.  no drainage  last mammogram - last over 1 year ago. No family history of breast cancer   Hypertension History:      She denies headache, chest pain, and palpitations.  Further comments include: present for several year.  however when she went to PCP in 2/09 she was restarted on meds and bp has improved.        Positive major cardiovascular risk factors include hypertension, family history for ischemic heart disease (males less than 47 years old), and current tobacco user.  Negative major cardiovascular risk factors include female age less than 77 years old.       Updated Prior Medication List: pt didn't bring her medications,but she said taking celexa, maxite, trazadone Current Allergies: ! BACTRIM  Past Surgical History:    Hysterectomy - 1985    Tubal ligation  Tonsillectomy - 1980   Family History:    htn - sister    diabetes - older sister, maternal aunt    pancreatic cancer - older sister (deceased)  Social History:    single    2 daughters    tobacco - 1/2 ppd    Alcohol use-social    Drug use-no   Risk Factors:  Tobacco use:  current    Year started:  age 62    Cigarettes:  Yes    Counseled to quit/cut down tobacco use:  yes Drug use:  no Alcohol use:  no  Family History Risk Factors:    Family History of MI in females < 34 years old:  no    Family History of MI in males < 26 years old:  yes   Review of Systems  Derm      lump under left area right shoulder with area of concern   Physical Exam  General:     alert.   Head:     normocephalic.   Eyes:  pupils round.   Mouth:     poor dentition.  multiple missing teeth Lungs:     normal breath sounds.   Heart:     normal rate and regular rhythm.   Abdomen:     normal bowel sounds.   Neurologic:     alert & oriented X3.   Skin:     1cm x 1.5 circumscribed cyst, indented center - right scapula  left axilla - 1.5x2cm firm, circumscribed mass    Impression & Recommendations:  Problem # 1:  MASS, LEFT AXILLA (ICD-782.2) will order mammogram with ultasound as pt has not had in over 10 years Orders: Mammogram (Diagnostic) (Mammo)   Problem # 2:  SEBACEOUS CYST (ICD-706.2) will monitor and after eval o f#1 will have pt come for removal  Problem # 3:  HYPERTENSION, BENIGN ESSENTIAL (ICD-401.1) stable. continue current meds Her updated medication list for this problem includes:    Maxzide-25 37.5-25 Mg Tabs (Triamterene-hctz) .Marland Kitchen... 1 tablet by mouth daily for blood pressure   Problem # 4:  DEPRESSION (ICD-311) stable. continue current meds Her updated medication list for this problem includes:    Trazodone Hcl 100 Mg Tabs (Trazodone hcl) .Marland Kitchen... 1 tablet by mouth nightly as needed for sleep    Celexa 40 Mg Tabs (Citalopram hydrobromide) .Marland Kitchen... 1  tablet by mouth daily for mood   Complete Medication List: 1)  Maxzide-25 37.5-25 Mg Tabs (Triamterene-hctz) .Marland Kitchen.. 1 tablet by mouth daily for blood pressure 2)  Trazodone Hcl 100 Mg Tabs (Trazodone hcl) .Marland Kitchen.. 1 tablet by mouth nightly as needed for sleep 3)  Celexa 40 Mg Tabs (Citalopram hydrobromide) .Marland Kitchen.. 1 tablet by mouth daily for mood  Hypertension Assessment/Plan:      The patient's hypertensive risk group is category B: At least one risk factor (excluding diabetes) with no target organ damage.  Today's blood pressure is 126/78.  Her blood pressure goal is < 140/90.   Patient Instructions: 1)  Sign release to get records from Dr. Hal Hope at [facility]. 2)  You will be scheduled for mammogram. Please keep this appointment.  3)  Call for follow up 2 weeks after mammgram to review results.  Come fasting - do not eat after midnight for labs.  Rx not given to pt.  She already has Rx on file at [facility]. Prescriptions: CELEXA 40 MG TABS (CITALOPRAM HYDROBROMIDE) 1 tablet by mouth daily for mood  #30 x 1   Entered and Authorized by:   Lehman Prom FNP   Signed by:   Lehman Prom FNP on 01/12/2008   Method used:   Print then Give to Patient   RxID:   [id number] MAXZIDE-25 37.5-25 MG TABS (TRIAMTERENE-HCTZ) 1 tablet by mouth daily for blood pressure  #30 x 1   Entered and Authorized by:   Lehman Prom FNP   Signed by:   Lehman Prom FNP on 01/12/2008   Method used:   Print then Give to Patient   RxID:   [id number]  ]

## 2010-05-13 NOTE — Progress Notes (Signed)
Summary: Medical Refills  Phone Note Call from Patient Call back at Home Phone [phone] Call back at [phone]   Caller: Patient Summary of Call: The pt needs more medical refills from maxide .  [facility] ([address]) [phone]. [facility] FNP   Initial call taken by: Manon Hilding,  September 19, 2008 11:41 AM  Follow-up for Phone Call        forward to N. Martin,FNP Last filled 07/06/2008  Follow-up by: Levon Hedger,  September 20, 2008 5:22 PM  Additional Follow-up for Phone Call Additional follow up Details #1::        Rx sent to the pharmacy electronically. notify pt   Additional Follow-up by: Lehman Prom FNP,  September 20, 2008 5:59 PM    Additional Follow-up for Phone Call Additional follow up Details #2::    pt informed.  Follow-up by: Levon Hedger,  September 21, 2008 1:25 PM    Prescriptions: MAXZIDE-25 37.5-25 MG TABS (TRIAMTERENE-HCTZ) 1 tablet by mouth daily for blood pressure  #30 Each x 11   Entered and Authorized by:   Lehman Prom FNP   Signed by:   Lehman Prom FNP on 09/20/2008   Method used:   Electronically to        [facility] [id number]* (retail)       [address]       [place], [place]  [address]       Ph: [phone]       Fax: [phone]   RxID:   [id number]

## 2010-05-13 NOTE — Progress Notes (Signed)
  Phone Note Call from Patient   Summary of Call: pt states she took medical records to [place] when she left  and she is back now and wants to know if she is still eligible to be seen here as a patient.  Please contact pt and let her know where she stands. Initial call taken by: Levon Hedger,  September 21, 2008 1:26 PM  Follow-up for Phone Call        CANT CALL PATIENT BECAUSE HER PHONE IS NOT IN SERVICE. Follow-up by: Leodis Rains,  September 26, 2008 3:48 PM

## 2010-05-13 NOTE — Assessment & Plan Note (Signed)
Summary: 63 pt/removal of cyst//kt   Vital Signs:  Patient Profile:   55 Years Old Female Height:     61.52 inches (156.25 cm) Weight:      153 pounds BMI:     28.53 Temp:     97.8 degrees F Pulse rate:   72 / minute Pulse rhythm:   regular Resp:     18 per minute BP sitting:   120 / 80  (left arm) Cuff size:   regular  Vitals Entered By: Vesta Mixer CMA (March 22, 2008 8:43 AM)             Is Patient Diabetic? No  Does patient need assistance? Ambulation Normal     Chief Complaint:  removal of growth on back near right shoulder.  History of Present Illness: 1/2 cm epidermoid cyst, right upper back.  Here for removal    Prior Medications Reviewed Using: Medication Bottles  Current Allergies (reviewed today): ! BACTRIM      Physical Exam  Skin:     Right upper back area cleaned and draped in a sterile fashion.  1 % Lidocaine, 3cc, injected about lesion subcutaneously.  Elliptical incision over top of cyst made, with sharp culling of cyst from attached subcutaneous tissue.  Cyst removed in total.  Wound closed with 4-0 ethilon, 3 simple interrupted sutures.  Good hemostasis.  Pt. tolerated the procedure well without complication.    Impression & Recommendations:  Problem # 1:  EPIDERMOID CYST, BACK (ICD-706.2) Removed. Orders: Excision Skin Lesion ([facility]) 0.6-1.0 cm  (11421)   Complete Medication List: 1)  Maxzide-25 37.5-25 Mg Tabs (Triamterene-hctz) .Marland Kitchen.. 1 tablet by mouth daily for blood pressure 2)  Trazodone Hcl 100 Mg Tabs (Trazodone hcl) .Marland Kitchen.. 1 tablet by mouth nightly as needed for sleep 3)  Celexa 40 Mg Tabs (Citalopram hydrobromide) .Marland Kitchen.. 1 tablet by mouth daily for mood   Patient Instructions: 1)  Follow up for suture removal next Friday--nurse visit please 2)  Call if redness, swelling, discharge, fever, or significant pain. 3)  Apply direct pressure if bleeds. 4)  Keep dry for 3 days, then may get wet, but do not scrub or wipe at  area. 5)  Change bandage daily.   ]

## 2010-05-15 ENCOUNTER — Encounter (INDEPENDENT_AMBULATORY_CARE_PROVIDER_SITE_OTHER): Payer: Self-pay | Admitting: Nurse Practitioner

## 2010-05-15 ENCOUNTER — Encounter: Payer: Self-pay | Admitting: Nurse Practitioner

## 2010-05-15 DIAGNOSIS — N3000 Acute cystitis without hematuria: Secondary | ICD-10-CM | POA: Insufficient documentation

## 2010-05-15 DIAGNOSIS — R109 Unspecified abdominal pain: Secondary | ICD-10-CM | POA: Insufficient documentation

## 2010-05-15 LAB — CONVERTED CEMR LAB
ALT: 23 units/L (ref 0–35)
AST: 31 units/L (ref 0–37)
Albumin: 4.1 g/dL (ref 3.5–5.2)
Alkaline Phosphatase: 86 units/L (ref 39–117)
BUN: 12 mg/dL (ref 6–23)
Basophils Absolute: 0 10*3/uL (ref 0.0–0.1)
Basophils Relative: 0 % (ref 0–1)
Bilirubin Urine: NEGATIVE
Blood in Urine, dipstick: NEGATIVE
CO2: 28 meq/L (ref 19–32)
Calcium: 9.6 mg/dL (ref 8.4–10.5)
Chlamydia, DNA Probe: NEGATIVE
Chloride: 102 meq/L (ref 96–112)
Creatinine, Ser: 0.77 mg/dL (ref 0.40–1.20)
Creatinine, Urine: 155.7 mg/dL
Eosinophils Absolute: 0.4 10*3/uL (ref 0.0–0.7)
Eosinophils Relative: 5 % (ref 0–5)
GC Probe Amp, Genital: NEGATIVE
Glucose, Bld: 79 mg/dL (ref 70–99)
Glucose, Urine, Semiquant: NEGATIVE
HCT: 42.1 % (ref 36.0–46.0)
HIV: NONREACTIVE
Hemoglobin: 13.3 g/dL (ref 12.0–15.0)
KOH Prep: NEGATIVE
Ketones, urine, test strip: NEGATIVE
Lymphocytes Relative: 42 % (ref 12–46)
Lymphs Abs: 2.9 10*3/uL (ref 0.7–4.0)
MCHC: 31.6 g/dL (ref 30.0–36.0)
MCV: 87.2 fL (ref 78.0–100.0)
Microalb Creat Ratio: 6.3 mg/g (ref 0.0–30.0)
Microalb, Ur: 0.98 mg/dL (ref 0.00–1.89)
Monocytes Absolute: 0.5 10*3/uL (ref 0.1–1.0)
Monocytes Relative: 8 % (ref 3–12)
Neutro Abs: 3.1 10*3/uL (ref 1.7–7.7)
Neutrophils Relative %: 45 % (ref 43–77)
Nitrite: POSITIVE
Platelets: 294 10*3/uL (ref 150–400)
Potassium: 4.2 meq/L (ref 3.5–5.3)
Protein, U semiquant: NEGATIVE
RBC: 4.83 M/uL (ref 3.87–5.11)
RDW: 14.9 % (ref 11.5–15.5)
Sodium: 141 meq/L (ref 135–145)
Specific Gravity, Urine: 1.015
TSH: 1.126 microintl units/mL (ref 0.350–4.500)
Total Bilirubin: 0.6 mg/dL (ref 0.3–1.2)
Total Protein: 7.3 g/dL (ref 6.0–8.3)
Urobilinogen, UA: 0.2
WBC: 6.8 10*3/uL (ref 4.0–10.5)
pH: 7

## 2010-05-16 ENCOUNTER — Encounter (INDEPENDENT_AMBULATORY_CARE_PROVIDER_SITE_OTHER): Payer: Self-pay | Admitting: Nurse Practitioner

## 2010-05-16 NOTE — Letter (Signed)
Summary: PATIENT INFORMATION & HISTORY SHEET  PATIENT INFORMATION & HISTORY SHEET   Imported ByArta Bruce 01/12/2008 13:23:10  _____________________________________________________________________  External Attachment:    Type:   Image     Comment:   External Document

## 2010-05-21 ENCOUNTER — Encounter (INDEPENDENT_AMBULATORY_CARE_PROVIDER_SITE_OTHER): Payer: Self-pay | Admitting: Nurse Practitioner

## 2010-05-21 NOTE — Assessment & Plan Note (Signed)
Summary: HTN   Vital Signs:  Patient profile:   55 year old female Menstrual status:  postmenopausal Weight:      163.4 pounds BMI:     30.46 Temp:     97.4 degrees F oral Pulse rate:   76 / minute Pulse rhythm:   regular Resp:     16 per minute BP sitting:   130 / 78  (left arm) Cuff size:   regular  Vitals Entered By: Levon Hedger (May 15, 2010 10:07 AM)  Nutrition Counseling: Patient's BMI is greater than 25 and therefore counseled on weight management options. CC: check BP...cyst in groin area, Hypertension Management, Depression Is Patient Diabetic? No Pain Assessment Patient in pain? yes     Location: groin area  Does patient need assistance? Functional Status Self care Ambulation Normal     Menstrual Status postmenopausal Last PAP Result  Specimen Adequacy: Satisfactory for evaluation.   Interpretation/Result:Negative for intraepithelial Lesion or Malignancy.      CC:  check BP...cyst in groin area, Hypertension Management, and Depression.  History of Present Illness:  Pt into the office fo f/u. She has not been seen here since 2010.  Social - pt is now living at the  [employer].  She is also going to school.  Right groin pain - Present x 1 year. Area has increased in size over time. She did get the PA to check it when she went to Urgent Care a few weeks back and it is not a boil. Pt reports that her last sexual interaction was over 1 year ago.   Depression History:      Positive alarm features for depression include impaired concentration (indecisiveness).  However, she denies recurrent thoughts of death or suicide.        Psychosocial stress factors include the recent death of a loved one.  The patient denies that she feels like life is not worth living, denies that she wishes that she were dead, and denies that she has thought about ending her life.        Comments:  Pt has meds from the [facility] center and she has f/u there every 3 months. Pt  reports that she lost her brother since her last visit here and had a increase in her depression.  Hypertension History:      She denies headache, chest pain, and palpitations.  She notes no problems with any antihypertensive medication side effects.  Pt went to [facility] 2 weeks ago because she was without her meds. She was started on HCTZ 12.5mg  by mouth daily but she was previously on 25mg  by mouth daily. She had been without her meds for a long time which was why it was elevated.        Positive major cardiovascular risk factors include female age 57 years old or older, hypertension, family history for ischemic heart disease (males less than 26 years old), and current tobacco user.        Further assessment for target organ damage reveals no history of ASHD, cardiac end-organ damage (CHF/LVH), stroke/TIA, peripheral vascular disease, renal insufficiency, or hypertensive retinopathy.      Habits & Providers  Alcohol-Tobacco-Diet     Alcohol drinks/day: 0     Tobacco Status: current     Tobacco Counseling: to quit use of tobacco products  Exercise-Depression-Behavior     Does Patient Exercise: yes     Exercise Counseling: to improve exercise regimen     Type of  exercise: walking     Exercise (avg: min/session): <30     Times/week: 5     Drug Use: yes  Allergies (verified): 1)  ! Bactrim  Review of Systems CV:  Denies chest pain or discomfort. Resp:  Denies cough. GI:  Denies abdominal pain, nausea, and vomiting. GU:  Denies abnormal vaginal bleeding, discharge, and genital sores; pt is s/p a partial hysterectomy - ovaries remain.  Physical Exam  General:  alert.   Mouth:  poor dentation Lungs:  normal breath sounds.   Heart:  normal rate and no murmur.   Genitalia:  right inguinal - nontender, circumscribed ? LAD Msk:  normal ROM.   Neurologic:  alert & oriented X3.   Psych:  Oriented X3.     Impression & Recommendations:  Problem # 1:  INGUINAL PAIN, RIGHT  (ICD-789.09)  ? an enlarged lymph node will check for infection if no infection will consider u/s  Her updated medication list for this problem includes:    Ibuprofen 800 Mg Tabs (Ibuprofen) ..... One tablet by mouth two times a day as needed for pain  Orders: T- GC Chlamydia (76283) KOH/ WET Mount [phone]) UA Dipstick w/o Micro (manual) (81002) T-Syphilis Test (RPR) (16073-71062) T-HIV Antibody  (Reflex) ([id number])  Problem # 2:  HYPERTENSION, BENIGN ESSENTIAL (ICD-401.1)  Bp is stable Continue DASH diet Her updated medication list for this problem includes:    Hydrochlorothiazide 12.5 Mg Caps (Hydrochlorothiazide) ..... One tablet by mouth daily for blood pressure  Orders: T-Comprehensive Metabolic Panel [id number]) T-CBC w/Diff (99371-69678) T-TSH (93810-17510) T-Urine Microalbumin w/creat. ratio ([phone])  Problem # 3:  NEED PROPHYLACTIC VACCINATION&INOCULATION FLU (ICD-V04.81) given today in office  Complete Medication List: 1)  Hydrochlorothiazide 12.5 Mg Caps (Hydrochlorothiazide) .... One tablet by mouth daily for blood pressure 2)  Trazodone Hcl 100 Mg Tabs (Trazodone hcl) .Marland Kitchen.. 1 tablet by mouth nightly as needed for sleep 3)  Celexa 40 Mg Tabs (Citalopram hydrobromide) .Marland Kitchen.. 1 tablet by mouth daily for mood **rx by [facility]** 4)  Ibuprofen 800 Mg Tabs (Ibuprofen) .... One tablet by mouth two times a day as needed for pain 5)  Hydroxyzine Hcl 25 Mg Tabs (Hydroxyzine hcl) .... One tablet by mouth two times a day as needed  **rx by [facility]**  Other Orders: Flu Vaccine 57yrs + (61443) Admin 1st Vaccine (15400) T-Culture, Urine ([id number])  Hypertension Assessment/Plan:      The patient's hypertensive risk group is category B: At least one risk factor (excluding diabetes) with no target organ damage.  Her calculated 10 year risk of coronary heart disease is 8 %.  Today's blood pressure is 130/78.  Her blood pressure goal is <  140/90.  Patient Instructions: 1)  Your tetanus is up to date.  it was last given in 2009 2)  You have been given the flu vaccine today. 3)  Your labs will be checked today - kidneys, liver, thyroid. 4)  Blood pressure - doing ok.  Will continue on HCTZ 12.5mg  by mouth daily.  Keep a check on your blood pressure.  If you see it starting to go back up then your dose may need to be increased 5)  Call and schedule an appointment for a complete physical exam. 6)  No food after midnight before this visit. Prescriptions: HYDROCHLOROTHIAZIDE 12.5 MG CAPS (HYDROCHLOROTHIAZIDE) One tablet by mouth daily for blood pressure  #30 x 11   Entered and Authorized by:   Lehman Prom FNP   Signed by:  Lehman Prom FNP on 05/15/2010   Method used:   Print then Give to Patient   RxID:   [id number]    Orders Added: 1)  Est. Patient Level III [99213] 2)  T-Comprehensive Metabolic Panel [80053-22900] 3)  T-CBC w/Diff [96295-28413] 4)  T-TSH [24401-02725] 5)  T- GC Chlamydia [36644] 6)  KOH/ WET Mount [03474] 7)  UA Dipstick w/o Micro (manual) [81002] 8)  T-Urine Microalbumin w/creat. ratio [[id number]] 9)  T-Syphilis Test (RPR) [25956-38756] 10)  T-HIV Antibody  (Reflex) [[id number]] 11)  Flu Vaccine 65yrs + [90658] 12)  Admin 1st Vaccine [90471] 13)  T-Culture, Urine [16606-30160]   Immunizations Administered:  Influenza Vaccine # 1:    Vaccine Type: Fluvax 3+    Site: left deltoid    Mfr: GlaxoSmithKline    Dose: 0.5 ml    Route: IM    Given by: Levon Hedger    Exp. Date: 10/11/2010    Lot #: [id number]    VIS given: 11/05/09 version given May 15, 2010.  Flu Vaccine Consent Questions:    Do you have a history of severe allergic reactions to this vaccine? no    Any prior history of allergic reactions to egg and/or gelatin? no    Do you have a sensitivity to the preservative Thimersol? no    Do you have a past history of Guillan-Barre Syndrome? no    Do you  currently have an acute febrile illness? no    Have you ever had a severe reaction to latex? no    Vaccine information given and explained to patient? yes    Are you currently pregnant? no   ndc  [phone]  Immunizations Administered:  Influenza Vaccine # 1:    Vaccine Type: Fluvax 3+    Site: left deltoid    Mfr: GlaxoSmithKline    Dose: 0.5 ml    Route: IM    Given by: Levon Hedger    Exp. Date: 10/11/2010    Lot #: [id number]    VIS given: 11/05/09 version given May 15, 2010.  Prevention & Chronic Care Immunizations   Influenza vaccine: Fluvax 3+  (05/15/2010)    Tetanus booster: 03/01/2008: Tdap    Pneumococcal vaccine: Not documented  Colorectal Screening   Hemoccult: Not documented   Hemoccult action/deferral: Given x 3  (03/01/2008)    Colonoscopy: Not documented   Colonoscopy action/deferral: patient defers today but will consider in the future  (03/01/2008)  Other Screening   Pap smear:  Specimen Adequacy: Satisfactory for evaluation.   Interpretation/Result:Negative for intraepithelial Lesion or Malignancy.     (03/01/2008)   Pap smear action/deferral: PAP smear done  (03/01/2008)    Mammogram: ASSESSMENT: Negative - BI-RADS 1^MM DIGITAL SCREENING  (01/18/2009)   Mammogram action/deferral: mammogram not due yet  (03/01/2008)   Smoking status: current  (05/15/2010)   Smoking cessation counseling: yes  (02/01/2008)  Lipids   Total Cholesterol: 189  (03/01/2008)   LDL: 111  (03/01/2008)   LDL Direct: Not documented   HDL: 60  (03/01/2008)   Triglycerides: 91  (03/01/2008)  Hypertension   Last Blood Pressure: 130 / 78  (05/15/2010)   Serum creatinine: 0.78  (03/01/2008)   Serum potassium 4.6  (03/01/2008) CMP ordered   Self-Management Support :    Hypertension self-management support: Not documented   Nursing Instructions: Give Flu vaccine today     Laboratory Results   Urine Tests  Date/Time Received: May 15, 2010 3:19  PM   Routine  Urinalysis   Color: yellow Appearance: Cloudy Glucose: negative   (Normal Range: Negative) Bilirubin: negative   (Normal Range: Negative) Ketone: negative   (Normal Range: Negative) Spec. Gravity: 1.015   (Normal Range: 1.003-1.035) Blood: negative   (Normal Range: Negative) pH: 7.0   (Normal Range: 5.0-8.0) Protein: negative   (Normal Range: Negative) Urobilinogen: 0.2   (Normal Range: 0-1) Nitrite: positive   (Normal Range: Negative) Leukocyte Esterace: trace   (Normal Range: Negative)    Comments: u/a results reviewed after pt left office as she gave U/A on her way out - will await culture results and call her with the results Date/Time Received: May 15, 2010 2:48 PM   [employer] Mount Source: vaginal WBC/hpf: 1-5 Bacteria/hpf: rare Clue cells/hpf: none Yeast/hpf: none Wet Mount KOH: Negative Trichomonas/hpf: none   Laboratory Results   Urine Tests    Routine Urinalysis   Color: yellow Appearance: Cloudy Glucose: negative   (Normal Range: Negative) Bilirubin: negative   (Normal Range: Negative) Ketone: negative   (Normal Range: Negative) Spec. Gravity: 1.015   (Normal Range: 1.003-1.035) Blood: negative   (Normal Range: Negative) pH: 7.0   (Normal Range: 5.0-8.0) Protein: negative   (Normal Range: Negative) Urobilinogen: 0.2   (Normal Range: 0-1) Nitrite: positive   (Normal Range: Negative) Leukocyte Esterace: trace   (Normal Range: Negative)    Comments: u/a results reviewed after pt left office as she gave U/A on her way out - will await culture results and call her with the results   Wet Mount/KOH

## 2010-05-29 ENCOUNTER — Encounter (INDEPENDENT_AMBULATORY_CARE_PROVIDER_SITE_OTHER): Payer: Self-pay | Admitting: Nurse Practitioner

## 2010-05-29 DIAGNOSIS — K029 Dental caries, unspecified: Secondary | ICD-10-CM | POA: Insufficient documentation

## 2010-05-29 NOTE — Assessment & Plan Note (Signed)
Summary: PPD placement  Nurse Visit   Allergies: 1)  ! Bactrim  Immunizations Administered:  PPD Skin Test:    Vaccine Type: PPD    Site: right forearm    Mfr: [employer]    Dose: 0.1 ml    Route: ID    Given by: Hale Drone CMA    Exp. Date: 09/11/2011    Lot #: [id number]  Orders Added: 1)  Est. Patient Nurse visit [09003] 2)  TB Skin Test [86580] 3)  Admin 1st Vaccine [90471]  Appended Document: neg PPD    Lab Visit  Orders Today: No Charge Patient Arrived (NCPA0) [NCPA0]    PPD Results    Date of reading: 05/23/2010    Results: < 5mm    Interpretation: negative

## 2010-06-04 NOTE — Letter (Signed)
Summary: DENTAL REFERRAL  DENTAL REFERRAL   Imported By: Arta Bruce 05/30/2010 10:21:02  _____________________________________________________________________  External Attachment:    Type:   Image     Comment:   External Document

## 2010-06-04 NOTE — Miscellaneous (Signed)
Summary: Dental Caries  Clinical Lists Changes  Problems: Added new problem of DENTAL CARIES (ICD-521.00) Orders: Added new Referral order of Dental Referral (Dentist) - Signed

## 2010-07-11 ENCOUNTER — Other Ambulatory Visit (HOSPITAL_COMMUNITY): Payer: Self-pay | Admitting: Family Medicine

## 2010-07-11 ENCOUNTER — Other Ambulatory Visit: Payer: Self-pay | Admitting: Nurse Practitioner

## 2010-07-11 ENCOUNTER — Encounter: Payer: Self-pay | Admitting: Nurse Practitioner

## 2010-07-11 ENCOUNTER — Encounter (INDEPENDENT_AMBULATORY_CARE_PROVIDER_SITE_OTHER): Payer: Self-pay | Admitting: Nurse Practitioner

## 2010-07-11 DIAGNOSIS — Z1231 Encounter for screening mammogram for malignant neoplasm of breast: Secondary | ICD-10-CM

## 2010-07-11 LAB — CONVERTED CEMR LAB
Chlamydia, DNA Probe: NEGATIVE
Cholesterol: 185 mg/dL (ref 0–200)
GC Probe Amp, Genital: NEGATIVE
HDL: 56 mg/dL (ref >39)
KOH Prep: NEGATIVE
LDL Cholesterol: 105 mg/dL — ABNORMAL HIGH (ref 0–99)
OCCULT 1: NEGATIVE
Total CHOL/HDL Ratio: 3.3
Triglycerides: 119 mg/dL (ref <150)
VLDL: 24 mg/dL (ref 0–40)

## 2010-07-15 NOTE — Assessment & Plan Note (Signed)
Summary: Complete Physical Exam   Vital Signs:  Patient profile:   54 year old female Menstrual status:  postmenopausal Weight:      169.5 pounds Temp:     98.0 degrees F oral Pulse rate:   73 / minute Pulse rhythm:   regular Resp:     16 per minute BP sitting:   148 / 99  (left arm) Cuff size:   regular  Vitals Entered By: Levon Hedger (July 11, 2010 10:41 AM) CC: CPP...groin pain, Hypertension Management Is Patient Diabetic? No Pain Assessment Patient in pain? yes     Location: groin Intensity: 3  Does patient need assistance? Functional Status Self care Ambulation Normal  Vision Screening:Left eye with correction: 20 / 20 Right eye with correction: 20 / 20-1 Both eyes with correction: 20 / 25-1        Vision Entered By: Levon Hedger (July 11, 2010 11:02 AM)   CC:  CPP...groin pain and Hypertension Management.  History of Present Illness:  Pt into the office for f/u on a complete physical exam  PAP - Last done in 2009 Post menopausal No family history of cervical or ovarian cancer  Mammogram - last done 01/2009 no family history of breast cancer  Optho  - wears prescription glasses.  Last eye exam done by Dr Mitzi Davenport  dental -  last dental appt was several years ago.  Dentures cracked 11/2009  tetanus - up to date  Colonscopy - no history of colonscopy.  No family history of colon cancer. some constipation.  Social - pt was living in a shelter but she is now getting ready to go to transitional housing  Hypertension History:      She denies headache, chest pain, and palpitations.  Pt did not take her bp meds today.        Positive major cardiovascular risk factors include female age 52 years old or older, hypertension, family history for ischemic heart disease (males less than 65 years old), and current tobacco user.        Further assessment for target organ damage reveals no history of ASHD, cardiac end-organ damage (CHF/LVH), stroke/TIA,  peripheral vascular disease, renal insufficiency, or hypertensive retinopathy.     Habits & Providers  Alcohol-Tobacco-Diet     Alcohol drinks/day: 0     Tobacco Status: current     Tobacco Counseling: to quit use of tobacco products     Cigarette Packs/Day: 5 cigs per day     Year Started: age 45  Exercise-Depression-Behavior     Does Patient Exercise: yes     Exercise Counseling: to improve exercise regimen     Type of exercise: walking     Exercise (avg: min/session): <30     Times/week: 5     Have you felt down or hopeless? no     Have you felt little pleasure in things? no     Depression Counseling: not indicated; screening negative for depression     Drug Use: yes  Current Medications (verified): 1)  Hydrochlorothiazide 12.5 Mg Caps (Hydrochlorothiazide) .... One Tablet By Mouth Daily For Blood Pressure 2)  Celexa 40 Mg Tabs (Citalopram Hydrobromide) .Marland Kitchen.. 1 Tablet By Mouth Daily For Mood **rx By [facility]** 3)  Ibuprofen 800 Mg Tabs (Ibuprofen) .... One Tablet By Mouth Two Times A Day As Needed For Pain 4)  Hydroxyzine Hcl 25 Mg Tabs (Hydroxyzine Hcl) .... One Tablet By Mouth Two Times A Day As Needed  **rx By [facility]**  Allergies (verified): 1)  ! Bactrim  Review of Systems General:  Denies fever. Eyes:  Denies blurring. ENT:  Denies earache. CV:  Denies chest pain or discomfort. Resp:  Denies cough. GI:  Denies abdominal pain, nausea, and vomiting. GU:  Denies discharge. MS:  Denies joint pain. Derm:  Denies flushing. Neuro:  Denies headaches. Psych:  Denies depression. Endo:  Denies excessive urination.  Physical Exam  General:  alert.   Head:  normocephalic.   Eyes:  pupils round.  glasses Ears:  right - right cerumen impaction left - TM visible Nose:  no nasal discharge.   Mouth:  poor dentition.  edentulous upper and poor dentation with remaining bottom teeth Neck:  supple.   Chest Wall:  no mass.   Breasts:  skin/areolae  normal, no masses, and no abnormal thickening.   Lungs:  normal breath sounds.   Heart:  normal rate and regular rhythm.   Abdomen:  soft, non-tender, and normal bowel sounds.   Rectal:  external hemorrhoid(s).   Msk:  normal ROM.   Pulses:  R radial normal and L radial normal.   Extremities:  no edema Neurologic:  alert & oriented X3.   Skin:  color normal.   Psych:  Oriented X3.    Pelvic Exam  Vulva:      normal appearance.   Urethra and Bladder:      Urethra--normal.   Vagina:      physiologic discharge.   Cervix:      cervical o's not visualized Uterus:      smooth.   Adnexa:      nontender bilaterally.   Rectum:      heme negative stool.      Impression & Recommendations:  Problem # 1:  ROUTINE GYNECOLOGICAL EXAMINATION (ICD-V72.31) PAP done rec optho and dental exam colonscopy rec given to pt  Orders: Vision Screening (04540) UA Dipstick w/o Micro (manual) (81002) KOH/ WET Mount [phone]) Pap Smear, Thin Prep ( Collection of) (Q0091) T- GC Chlamydia (14782)  Problem # 2:  UNSPECIFIED BREAST SCREENING (ICD-V76.10) self breast exam given mammogram scheduled Orders: Mammogram (Screening) (Mammo)  Problem # 3:  HYPERTENSION, BENIGN ESSENTIAL (ICD-401.1) BP is slightly elevated may need to increase HCTZ back to original dose of 25 Her updated medication list for this problem includes:    Hydrochlorothiazide 12.5 Mg Caps (Hydrochlorothiazide) ..... One tablet by mouth daily for blood pressure  Orders: T-Lipid Profile (95621-30865) EKG w/ Interpretation (93000) Hemoccult Guaiac-1 spec.(in office) (82270)  Problem # 4:  TOBACCO ABUSE (ICD-305.1) encouraged cessation  Complete Medication List: 1)  Hydrochlorothiazide 12.5 Mg Caps (Hydrochlorothiazide) .... One tablet by mouth daily for blood pressure 2)  Celexa 40 Mg Tabs (Citalopram hydrobromide) .Marland Kitchen.. 1 tablet by mouth daily for mood **rx by [facility]** 3)  Ibuprofen 800 Mg Tabs (Ibuprofen)  .... One tablet by mouth two times a day as needed for pain 4)  Hydroxyzine Hcl 25 Mg Tabs (Hydroxyzine hcl) .... One tablet by mouth two times a day as needed  **rx by [facility]**  Hypertension Assessment/Plan:      The patient's hypertensive risk group is category B: At least one risk factor (excluding diabetes) with no target organ damage.  Her calculated 10 year risk of coronary heart disease is 11 %.  Today's blood pressure is 148/99.  Her blood pressure goal is < 140/90.  Patient Instructions: 1)  [facility] is not taking any referrals at this  time. You can go either to [facility] or [facility] (see handout) 2)  Keep your appointment for mammogram 3)  Your cholesterol will be checked today and you will be notified of the results 4)  It has been a pleasure being your provider.  Nothing but the best for you in the future.   Orders Added: 1)  Vision Screening [99173] 2)  UA Dipstick w/o Micro (manual) [81002] 3)  KOH/ WET Mount [87210] 4)  Pap Smear, Thin Prep ( Collection of) [Q0091] 5)  T- GC Chlamydia [95621] 6)  T-Lipid Profile [80061-22930] 7)  EKG w/ Interpretation [93000] 8)  Hemoccult Guaiac-1 spec.(in office) [82270] 9)  Mammogram (Screening) [Mammo]    Laboratory Results  Date/Time Received: July 11, 2010 2:15 PM   [employer] Source: vaginal WBC/hpf: 1-5 Bacteria/hpf: rare Clue cells/hpf: none Yeast/hpf: none Wet Mount KOH: Negative Trichomonas/hpf: none  Stool - Occult Blood Hemmoccult #1: negative Date: 07/11/2010   Appended Document: Complete Physical Exam     Allergies: 1)  ! Bactrim   Complete Medication List: 1)  Hydrochlorothiazide 12.5 Mg Caps (Hydrochlorothiazide) .... One tablet by mouth daily for blood pressure 2)  Celexa 40 Mg Tabs (Citalopram hydrobromide) .Marland Kitchen.. 1 tablet by mouth daily for mood **rx by [facility]** 3)  Ibuprofen 800 Mg Tabs (Ibuprofen) .... One tablet by mouth two times a day as needed  for pain 4)  Hydroxyzine Hcl 25 Mg Tabs (Hydroxyzine hcl) .... One tablet by mouth two times a day as needed  **rx by [facility]**      Laboratory Results   Urine Tests  Date/Time Received: July 11, 2010 5:05 PM   Routine Urinalysis   Color: lt. yellow Appearance: Clear Glucose: negative   (Normal Range: Negative) Bilirubin: negative   (Normal Range: Negative) Ketone: negative   (Normal Range: Negative) Spec. Gravity: 1.020   (Normal Range: 1.003-1.035) Blood: negative   (Normal Range: Negative) pH: 5.0   (Normal Range: 5.0-8.0) Protein: negative   (Normal Range: Negative) Urobilinogen: 0.2   (Normal Range: 0-1) Nitrite: negative   (Normal Range: Negative) Leukocyte Esterace: negative   (Normal Range: Negative)

## 2010-07-18 ENCOUNTER — Ambulatory Visit (HOSPITAL_COMMUNITY): Payer: Self-pay

## 2010-07-22 ENCOUNTER — Ambulatory Visit (HOSPITAL_COMMUNITY)
Admission: RE | Admit: 2010-07-22 | Discharge: 2010-07-22 | Disposition: A | Discharge status: Home / Self Care | Payer: Self-pay | Source: Ambulatory Visit | Attending: Family Medicine | Admitting: Family Medicine

## 2010-07-22 DIAGNOSIS — Z1231 Encounter for screening mammogram for malignant neoplasm of breast: Secondary | ICD-10-CM | POA: Insufficient documentation

## 2011-02-12 ENCOUNTER — Emergency Department (HOSPITAL_COMMUNITY): Payer: Medicaid Other

## 2011-02-12 ENCOUNTER — Inpatient Hospital Stay (HOSPITAL_COMMUNITY)
Admission: EM | Admit: 2011-02-12 | Discharge: 2011-02-14 | DRG: 247 | Disposition: A | Discharge status: Home / Self Care | Payer: Medicaid Other | Source: Other Acute Inpatient Hospital | Attending: Cardiology | Admitting: Cardiology

## 2011-02-12 DIAGNOSIS — Z7902 Long term (current) use of antithrombotics/antiplatelets: Secondary | ICD-10-CM

## 2011-02-12 DIAGNOSIS — F17200 Nicotine dependence, unspecified, uncomplicated: Secondary | ICD-10-CM | POA: Diagnosis present

## 2011-02-12 DIAGNOSIS — R110 Nausea: Secondary | ICD-10-CM | POA: Diagnosis present

## 2011-02-12 DIAGNOSIS — F329 Major depressive disorder, single episode, unspecified: Secondary | ICD-10-CM | POA: Diagnosis present

## 2011-02-12 DIAGNOSIS — F411 Generalized anxiety disorder: Secondary | ICD-10-CM | POA: Diagnosis present

## 2011-02-12 DIAGNOSIS — Z7982 Long term (current) use of aspirin: Secondary | ICD-10-CM

## 2011-02-12 DIAGNOSIS — I214 Non-ST elevation (NSTEMI) myocardial infarction: Principal | ICD-10-CM | POA: Diagnosis present

## 2011-02-12 DIAGNOSIS — R079 Chest pain, unspecified: Secondary | ICD-10-CM

## 2011-02-12 DIAGNOSIS — E669 Obesity, unspecified: Secondary | ICD-10-CM | POA: Diagnosis present

## 2011-02-12 DIAGNOSIS — I10 Essential (primary) hypertension: Secondary | ICD-10-CM | POA: Diagnosis present

## 2011-02-12 DIAGNOSIS — E876 Hypokalemia: Secondary | ICD-10-CM | POA: Diagnosis present

## 2011-02-12 DIAGNOSIS — R42 Dizziness and giddiness: Secondary | ICD-10-CM | POA: Diagnosis present

## 2011-02-12 DIAGNOSIS — F3289 Other specified depressive episodes: Secondary | ICD-10-CM | POA: Diagnosis present

## 2011-02-12 DIAGNOSIS — F1210 Cannabis abuse, uncomplicated: Secondary | ICD-10-CM | POA: Diagnosis present

## 2011-02-12 LAB — CBC
HCT: 38.2 % (ref 36.0–46.0)
Hemoglobin: 12.4 g/dL (ref 12.0–15.0)
MCH: 28.4 pg (ref 26.0–34.0)
MCHC: 32.5 g/dL (ref 30.0–36.0)
MCV: 87.4 fL (ref 78.0–100.0)
Platelets: 234 10*3/uL (ref 150–400)
RBC: 4.37 MIL/uL (ref 3.87–5.11)
RDW: 13.7 % (ref 11.5–15.5)
WBC: 6.8 10*3/uL (ref 4.0–10.5)

## 2011-02-12 LAB — URINALYSIS, ROUTINE W REFLEX MICROSCOPIC
Bilirubin Urine: NEGATIVE
Glucose, UA: NEGATIVE mg/dL
Hgb urine dipstick: NEGATIVE
Ketones, ur: NEGATIVE mg/dL
Leukocytes, UA: NEGATIVE
Nitrite: NEGATIVE
Protein, ur: NEGATIVE mg/dL
Specific Gravity, Urine: 1.013 (ref 1.005–1.030)
Urobilinogen, UA: 1 mg/dL (ref 0.0–1.0)
pH: 7.5 (ref 5.0–8.0)

## 2011-02-12 LAB — COMPREHENSIVE METABOLIC PANEL
ALT: 17 U/L (ref 0–35)
AST: 24 U/L (ref 0–37)
Albumin: 3.6 g/dL (ref 3.5–5.2)
Alkaline Phosphatase: 76 U/L (ref 39–117)
BUN: 10 mg/dL (ref 6–23)
CO2: 23 mEq/L (ref 19–32)
Calcium: 10 mg/dL (ref 8.4–10.5)
Chloride: 101 mEq/L (ref 96–112)
Creatinine, Ser: 0.74 mg/dL (ref 0.50–1.10)
GFR calc Af Amer: >90 mL/min (ref >90)
GFR calc non Af Amer: >90 mL/min (ref >90)
Glucose, Bld: 117 mg/dL — ABNORMAL HIGH (ref 70–99)
Potassium: 3.2 mEq/L — ABNORMAL LOW (ref 3.5–5.1)
Sodium: 137 mEq/L (ref 135–145)
Total Bilirubin: 0.5 mg/dL (ref 0.3–1.2)
Total Protein: 7.2 g/dL (ref 6.0–8.3)

## 2011-02-12 LAB — TROPONIN I: Troponin I: 0.44 ng/mL (ref <0.30)

## 2011-02-12 LAB — POCT I-STAT TROPONIN I: Troponin i, poc: 0.05 ng/mL (ref 0.00–0.08)

## 2011-02-12 LAB — POCT I-STAT, CHEM 8
BUN: 10 mg/dL (ref 6–23)
Calcium, Ion: 1.19 mmol/L (ref 1.12–1.32)
Chloride: 105 mEq/L (ref 96–112)
Creatinine, Ser: 0.9 mg/dL (ref 0.50–1.10)
Glucose, Bld: 112 mg/dL — ABNORMAL HIGH (ref 70–99)
HCT: 40 % (ref 36.0–46.0)
Hemoglobin: 13.6 g/dL (ref 12.0–15.0)
Potassium: 3.1 mEq/L — ABNORMAL LOW (ref 3.5–5.1)
Sodium: 141 mEq/L (ref 135–145)
TCO2: 24 mmol/L (ref 0–100)

## 2011-02-12 LAB — RAPID URINE DRUG SCREEN, HOSP PERFORMED
Amphetamines: NOT DETECTED
Barbiturates: NOT DETECTED
Benzodiazepines: NOT DETECTED
Cocaine: NOT DETECTED
Opiates: POSITIVE — AB
Tetrahydrocannabinol: POSITIVE — AB

## 2011-02-12 LAB — CK TOTAL AND CKMB (NOT AT ARMC)
CK, MB: 3.4 ng/mL (ref 0.3–4.0)
Relative Index: 3.4 — ABNORMAL HIGH (ref 0.0–2.5)
Total CK: 101 U/L (ref 7–177)

## 2011-02-12 LAB — PRO B NATRIURETIC PEPTIDE: Pro B Natriuretic peptide (BNP): 37.7 pg/mL (ref 0–125)

## 2011-02-12 LAB — APTT: aPTT: 32 seconds (ref 24–37)

## 2011-02-12 LAB — PROTIME-INR
INR: 1.03 (ref 0.00–1.49)
Prothrombin Time: 13.7 seconds (ref 11.6–15.2)

## 2011-02-13 DIAGNOSIS — I219 Acute myocardial infarction, unspecified: Secondary | ICD-10-CM

## 2011-02-13 DIAGNOSIS — I2510 Atherosclerotic heart disease of native coronary artery without angina pectoris: Secondary | ICD-10-CM

## 2011-02-13 LAB — CBC
HCT: 37.6 % (ref 36.0–46.0)
HCT: 38.9 % (ref 36.0–46.0)
Hemoglobin: 12.1 g/dL (ref 12.0–15.0)
Hemoglobin: 12.7 g/dL (ref 12.0–15.0)
MCH: 28.2 pg (ref 26.0–34.0)
MCH: 28.3 pg (ref 26.0–34.0)
MCHC: 32.2 g/dL (ref 30.0–36.0)
MCHC: 32.6 g/dL (ref 30.0–36.0)
MCV: 86.8 fL (ref 78.0–100.0)
MCV: 87.6 fL (ref 78.0–100.0)
Platelets: 221 10*3/uL (ref 150–400)
Platelets: 230 10*3/uL (ref 150–400)
RBC: 4.29 MIL/uL (ref 3.87–5.11)
RBC: 4.48 MIL/uL (ref 3.87–5.11)
RDW: 13.7 % (ref 11.5–15.5)
RDW: 13.8 % (ref 11.5–15.5)
WBC: 7.5 10*3/uL (ref 4.0–10.5)
WBC: 7.7 10*3/uL (ref 4.0–10.5)

## 2011-02-13 LAB — DIFFERENTIAL
Basophils Absolute: 0 10*3/uL (ref 0.0–0.1)
Basophils Relative: 0 % (ref 0–1)
Eosinophils Absolute: 0.1 10*3/uL (ref 0.0–0.7)
Eosinophils Relative: 1 % (ref 0–5)
Lymphocytes Relative: 30 % (ref 12–46)
Lymphs Abs: 2.3 10*3/uL (ref 0.7–4.0)
Monocytes Absolute: 0.5 10*3/uL (ref 0.1–1.0)
Monocytes Relative: 7 % (ref 3–12)
Neutro Abs: 4.8 10*3/uL (ref 1.7–7.7)
Neutrophils Relative %: 62 % (ref 43–77)

## 2011-02-13 LAB — LIPID PANEL
Cholesterol: 184 mg/dL (ref 0–200)
HDL: 51 mg/dL (ref >39)
LDL Cholesterol: 121 mg/dL — ABNORMAL HIGH (ref 0–99)
Total CHOL/HDL Ratio: 3.6 RATIO
Triglycerides: 59 mg/dL (ref <150)
VLDL: 12 mg/dL (ref 0–40)

## 2011-02-13 LAB — BASIC METABOLIC PANEL
BUN: 12 mg/dL (ref 6–23)
CO2: 27 mEq/L (ref 19–32)
Calcium: 9.6 mg/dL (ref 8.4–10.5)
Chloride: 109 mEq/L (ref 96–112)
Creatinine, Ser: 0.81 mg/dL (ref 0.50–1.10)
GFR calc Af Amer: >90 mL/min (ref >90)
GFR calc non Af Amer: 79 mL/min — ABNORMAL LOW (ref >90)
Glucose, Bld: 99 mg/dL (ref 70–99)
Potassium: 4.1 mEq/L (ref 3.5–5.1)
Sodium: 142 mEq/L (ref 135–145)

## 2011-02-13 LAB — CARDIAC PANEL(CRET KIN+CKTOT+MB+TROPI)
CK, MB: 10.9 ng/mL (ref 0.3–4.0)
CK, MB: 4.8 ng/mL — ABNORMAL HIGH (ref 0.3–4.0)
CK, MB: 9.2 ng/mL (ref 0.3–4.0)
CK, MB: 9.4 ng/mL (ref 0.3–4.0)
Relative Index: 4.2 — ABNORMAL HIGH (ref 0.0–2.5)
Relative Index: 6.5 — ABNORMAL HIGH (ref 0.0–2.5)
Relative Index: 6.6 — ABNORMAL HIGH (ref 0.0–2.5)
Relative Index: 6.9 — ABNORMAL HIGH (ref 0.0–2.5)
Total CK: 114 U/L (ref 7–177)
Total CK: 141 U/L (ref 7–177)
Total CK: 143 U/L (ref 7–177)
Total CK: 158 U/L (ref 7–177)
Troponin I: 0.85 ng/mL (ref <0.30)
Troponin I: 1.38 ng/mL (ref <0.30)
Troponin I: 1.67 ng/mL (ref <0.30)
Troponin I: 1.76 ng/mL (ref <0.30)

## 2011-02-13 LAB — POCT ACTIVATED CLOTTING TIME: Activated Clotting Time: 546 seconds

## 2011-02-13 LAB — PROTIME-INR
INR: 1.04 (ref 0.00–1.49)
Prothrombin Time: 13.8 seconds (ref 11.6–15.2)

## 2011-02-13 LAB — TSH: TSH: 0.73 u[IU]/mL (ref 0.350–4.500)

## 2011-02-13 LAB — HEMOGLOBIN A1C
Hgb A1c MFr Bld: 6.1 % — ABNORMAL HIGH (ref <5.7)
Mean Plasma Glucose: 128 mg/dL — ABNORMAL HIGH (ref <117)

## 2011-02-13 LAB — PRO B NATRIURETIC PEPTIDE: Pro B Natriuretic peptide (BNP): 44.6 pg/mL (ref 0–125)

## 2011-02-13 LAB — HEPARIN LEVEL (UNFRACTIONATED): Heparin Unfractionated: 0.29 IU/mL — ABNORMAL LOW (ref 0.30–0.70)

## 2011-02-13 LAB — MRSA PCR SCREENING: MRSA by PCR: NEGATIVE

## 2011-02-13 NOTE — Consult Note (Signed)
NAMEMarland Proctor  BREUNA, LOVEALL NO.:  [id number]  MEDICAL RECORD NO.:  [id number]  LOCATION:  2919                         FACILITY:  [facility]  PHYSICIAN:  Lyn Records, M.D.   DATE OF BIRTH:  04-Feb-1956  DATE OF CONSULTATION: DATE OF DISCHARGE:                                CONSULTATION   REASON FOR CONSULTATION:  Code STEMI activation.  SUBJECTIVE:  The patient is 55 years of age and was transported to the [facility] emergency room by [facility] after being called for chest pain.  The patient states that after getting out of class at [facility], she began having a heavy feeling in her chest.  She could not get comfortable.  She was trying to fix a roommate's hair but could not get comfortable because her chest was hurting.  EMS was called, and the first EKG demonstrated approximately 1 mm of inferior ST elevation in 2, 3, aVF with reciprocal lead 1, aVL, ST depression.  One nitroglycerin tablet was given in the ambulance and by the time the patient arrived in the emergency room, the chest discomfort was completely resolved and the EKG was back to normal.  The patient at this time, however, was no longer symptomatic from a chest pain standpoint but complaining of feeling dizzy and having a severe full feeling in her head. Historically, I tried to determine if perhaps the neurological complaint/headache was related to the nitroglycerin that she received. The story was conflicting, however, the patient states that she began feeling dizzy and unusual before EMS even arrived.  This was occurring simultaneously with the chest discomfort that she was having.  At the time of the evaluation at around 8:50 p.m., the patient was completely free of chest pain.  Her risk factors include a positive smoking history and hypertension. She does not know any details about her cholesterol.  She is nondiabetic.  She denies alcohol consumption.  There is no family history of  premature coronary atherosclerosis.  Her medical therapy is for hypertension but she cannot remember the medications.  PHYSICAL EXAMINATION:  GENERAL:  The patient would not make eye contact. Eyes are closed.  She is complaining of feeling dizzy and that her head is very full. VITAL SIGNS:  The blood pressure is 160/90, heart rate is 88. NECK:  Her neck veins are not distended. VITAL SIGNS:  Respirations is 16 and nonlabored.  O2 saturation is 96% on 2 L of oxygen.  LUNGS:  Clear anteriorly. CARDIAC:  No gallop.  Radial pulses and carotid pulses are 2+. Posterior tibial pulse on the right is palpable and the left dorsalis pedis is palpable.  There is no peripheral edema.  ABDOMEN:  Soft.  EKG in the emergency room is normal.  The initial EKG by EMS is as described above.  No other data is available.  ASSESSMENT: 1. Acute inferior ST-elevation spontaneously resolving with complete     resolution of chest pain, representing an aborted inferior     infarction. 2. Headache and neurological symptoms of uncertain etiology.  In this     setting, aortic dissection, embolic stroke, side effect of     nitroglycerin, and cerebrovascular disease should be considered.  PLAN:  I have counseled the code STEMI.  The patient should have a head CT.  Nitroglycerin should be given intravenously.  If there is no intracranial bleed, the patient should be started on IV heparin.  She will need to have coronary angiography.  This can be done within the next 24 hours after we fully understand her neurological complaints.  If ST elevation recurs, we will be available to carry the patient emergently to the cath lab assuming that no intracranial abnormality is present that would preclude anticoagulation.     Lyn Records, M.D.     HWS/MEDQ  D:  02/12/2011  T:  02/13/2011  Job:  [id number]  Electronically Signed by Verdis Prime M.D. on 02/13/2011 10:13:13 AM

## 2011-02-14 LAB — BASIC METABOLIC PANEL
BUN: 15 mg/dL (ref 6–23)
CO2: 25 mEq/L (ref 19–32)
Calcium: 9.6 mg/dL (ref 8.4–10.5)
Chloride: 107 mEq/L (ref 96–112)
Creatinine, Ser: 0.79 mg/dL (ref 0.50–1.10)
GFR calc Af Amer: >90 mL/min (ref >90)
GFR calc non Af Amer: >90 mL/min (ref >90)
Glucose, Bld: 98 mg/dL (ref 70–99)
Potassium: 4.2 mEq/L (ref 3.5–5.1)
Sodium: 139 mEq/L (ref 135–145)

## 2011-02-14 LAB — CBC
HCT: 36.4 % (ref 36.0–46.0)
Hemoglobin: 11.8 g/dL — ABNORMAL LOW (ref 12.0–15.0)
MCH: 28.4 pg (ref 26.0–34.0)
MCHC: 32.4 g/dL (ref 30.0–36.0)
MCV: 87.7 fL (ref 78.0–100.0)
Platelets: 206 10*3/uL (ref 150–400)
RBC: 4.15 MIL/uL (ref 3.87–5.11)
RDW: 14.1 % (ref 11.5–15.5)
WBC: 6.4 10*3/uL (ref 4.0–10.5)

## 2011-02-14 NOTE — Cardiovascular Report (Signed)
  NAMEMarland Kitchen  CHARMION, HAPKE NO.:  [id number]  MEDICAL RECORD NO.:  [id number]  LOCATION:  2506                         FACILITY:  [facility]  PHYSICIAN:  Marca Ancona, MD      DATE OF BIRTH:  1955-06-27  DATE OF PROCEDURE: DATE OF DISCHARGE:                           CARDIAC CATHETERIZATION   PROCEDURES: 1. Left heart cath. 2. Coronary angiography. 3. Left ventriculography.  INDICATION:  This is a 55 year old with history of smoking and hypertension who presented with an aborted inferior MI.  She initially had inferior ST elevation and chest pain, however, the ST elevation resolved spontaneously in the emergency room and chest pain resolved as well.  PROCEDURE NOTE:  After informed consent was obtained, the patient underwent Allen testing on her right wrist.  Right ulnar artery gave good collateral circulation to the radial side of the hand indicating a positive Allen test.  The right radial area was sterilely prepped and draped with 1% lidocaine, used to locally anesthetize the right radial area.  The right radia artery was  entered using modified Seldinger technique and a 5-French arterial sheath was placed.  The patient received 5000 units IV heparin and 3 mg intraarterial verapamil.  The right coronary artery was engaged with a JR4 catheter.  The left coronary artery was engaged with JL3.5 catheter.  Left ventricle was entered using angled pigtail catheter.  No complications.  FINDINGS: 1. Hemodynamics:  LV 140/20, aorta 136/74. 2. Left ventriculography:  EF was estimated to be 55-60% with basal     inferior hypokinesis. 3. Right coronary artery:  The right coronary artery is a dominant     vessel.  There was a 99% mid RCA stenosis with thrombus. 4. Left main:  Left main had luminal irregularities distally. 5. Left circumflex system:  There was a large first obtuse marginal     with a 40% proximal stenosis. 6. LAD system:  There was about a 30% mid LAD  stenosis and luminal     irregularities, otherwise.  IMPRESSION:  Subendocardial myocardial infarction 99% right coronary artery stenosis with thrombus.  PLAN:  Will be percutaneous coronary intervention.  Dr. Clifton James will come to do this part of the procedure.     Marca Ancona, MD     DM/MEDQ  D:  02/13/2011  T:  02/13/2011  Job:  [id number]  Electronically Signed by Marca Ancona MD on 02/14/2011 11:53:50 PM

## 2011-02-14 NOTE — H&P (Signed)
NAMEMarland Kitchen  FLORESTINE, CARMICAL NO.:  [id number]  MEDICAL RECORD NO.:  [id number]  LOCATION:  2919                         FACILITY:  [facility]  PHYSICIAN:  Marca Ancona, MD      DATE OF BIRTH:  [birth year]  DATE OF ADMISSION:  02/12/2011 DATE OF DISCHARGE:                             HISTORY & PHYSICAL   HISTORY OF PRESENT ILLNESS:  This is a 55 year old with history of hypertension and smoking who presented to the emergency room with chest pain, headache and transient ST elevation inferiorly.  At baseline, the patient says that she gets short of breath and has occasional chest pressure when she walks fast uphills or upstairs.  She attributes this to her long-time smoking.  Today, the patient walked up the hill from the bus stop to her boarding house and felt herself getting especially short of breath with less walk.  She went inside and laid down in bed after this.  While laying down in bed, she developed very severe substernal chest heaviness and pressure.  This was nonradiating.  She slept for about an hour, then she woke up, and the pressure was still present in her chest.  She stood up and the pressure became very severe. She developed nausea and vomited once.  Also at this time, she developed a severe headache and vertigo like dizziness.  She had a friend, called EMS for her.  When EMS arrived, they did an EKG that showed inferior ST- elevation.  Code STEMI was activated.  In the emergency room, the patient's chest pain actually resolved completely after 1 nitroglycerin. Her ST-elevation also resolved completely.  Her headache has well resolved completely.  The code STEMI was called off.  Currently, the patient is essentially asymptomatic and feels like she is at her baseline.  PAST MEDICAL HISTORY: 1. Hypertension. 2. Depression. 3. Smoker.  The patient smokes about a pack every 2-3 days.  SOCIAL HISTORY:  The patient lives in boarding house.  She  is unemployed.  She has 2 daughters.  She smokes marijuana and denies any cocaine.  FAMILY HISTORY:  The patient's father had CHF.  Her mother had cancer. She is not sure what type.  MEDICATIONS CURRENTLY: 1. Hydrochlorothiazide 12.5 mg daily. 2. Celexa 40 mg daily.  EKG: 1. Initial EKG showed normal sinus rhythm with 1.5-mm inferior ST     elevation. 2. Normal sinus rhythm with improvement in the inferior ST-segments,     but about a millimeter of ST elevation in V2 and V3. 3. Normal sinus rhythm with no ST elevation in any leads.  LABORATORY DATA:  So far, potassium 3.1, sodium 141, creatinine 0.9, troponin 0.05 and hematocrit 40.  PHYSICAL EXAMINATION:  VITAL SIGNS:  Heart rates in the 60s and regular, oxygen saturation 100% on 2 L nasal cannula, blood pressure 165/72, the patient is afebrile. GENERAL:  This is a 55 obese female, in no apparent distress. HEENT:  Normal exam. NECK:  There is no thyromegaly or thyroid nodule.  There is no JVD. LUNGS:  Clear to auscultation bilaterally with normal respiratory effort. CARDIOVASCULAR.  Heart regular S1 and S2.  No S3, no S4.  There is no murmur.  There is no edema. ABDOMEN:  Soft and nontender.  No hepatosplenomegaly. EXTREMITIES:  No clubbing or cyanosis. SKIN:  Normal exam. MUSCULOSKELETAL:  Normal exam.  REVIEW OF SYSTEMS:  All systems were reviewed and were negative except as noted in the history present illness.  IMPRESSION:  This is a 55 year old with history of hypertension and smoking who presented with chest pain and headache as well as inferior ST elevations on EKG that totally resolved at this point.  I suspect the patient had an aborted inferior myocardial infarction.  She is now chest pain-free. 1. Coronary artery disease.  The patient appears to have had an     aborted inferior myocardial infarction.  She is now chest pain-     free.  Her ST segments are resolved, but normal.  Her initial     troponin is not  elevated.  Given the patient's severe headache     which is now resolved, she will need an initial head CT to rule out     a subarachnoid hemorrhage before we anticoagulate her anymore than     aspirin.  She has received 325 mg aspirin.  She will get a chest x-     ray to assess for any widening of her mediastinum, though I doubt     dissection as her chest pain is completely resolved.  We will cycle     her cardiac enzymes if they become positive and her head CT is     negative, we will start her on low Plavix.  Also, if her head CT is     negative, we will start her on a heparin drip.  We will go ahead     and put her on a low-dose nitroglycerin drip.  Start a high-dose     statin.  She will get a urine drug screen.  If that is negative for     cocaine then we will start a beta blocker.  She will have left     heart catheterization in the morning and she has no further chest     pain. 2. Headache.  This is now resolved.  However, she has gone for a CT of     her head to rule out subarachnoid hemorrhage. 3. Hypokalemia.  This is likely due to her hydrochlorothiazide.  We     will give her some potassium.     Marca Ancona, MD     DM/MEDQ  D:  02/12/2011  T:  02/13/2011  Job:  [id number]  Electronically Signed by Marca Ancona MD on 02/14/2011 11:52:30 PM

## 2011-02-16 ENCOUNTER — Emergency Department (HOSPITAL_COMMUNITY)
Admission: EM | Admit: 2011-02-16 | Discharge: 2011-02-17 | Disposition: A | Discharge status: Home / Self Care | Payer: Self-pay | Attending: Emergency Medicine | Admitting: Emergency Medicine

## 2011-02-16 ENCOUNTER — Encounter: Payer: Self-pay | Admitting: *Deleted

## 2011-02-16 ENCOUNTER — Telehealth: Payer: Self-pay | Admitting: Cardiology

## 2011-02-16 DIAGNOSIS — Z79899 Other long term (current) drug therapy: Secondary | ICD-10-CM | POA: Insufficient documentation

## 2011-02-16 DIAGNOSIS — H7290 Unspecified perforation of tympanic membrane, unspecified ear: Secondary | ICD-10-CM | POA: Insufficient documentation

## 2011-02-16 DIAGNOSIS — H748X9 Other specified disorders of middle ear and mastoid, unspecified ear: Secondary | ICD-10-CM | POA: Insufficient documentation

## 2011-02-16 DIAGNOSIS — H9210 Otorrhea, unspecified ear: Secondary | ICD-10-CM | POA: Insufficient documentation

## 2011-02-16 DIAGNOSIS — H9209 Otalgia, unspecified ear: Secondary | ICD-10-CM | POA: Insufficient documentation

## 2011-02-16 DIAGNOSIS — F329 Major depressive disorder, single episode, unspecified: Secondary | ICD-10-CM | POA: Insufficient documentation

## 2011-02-16 DIAGNOSIS — I10 Essential (primary) hypertension: Secondary | ICD-10-CM | POA: Insufficient documentation

## 2011-02-16 DIAGNOSIS — F3289 Other specified depressive episodes: Secondary | ICD-10-CM | POA: Insufficient documentation

## 2011-02-16 HISTORY — DX: Major depressive disorder, single episode, unspecified: F32.9

## 2011-02-16 HISTORY — DX: Essential (primary) hypertension: I10

## 2011-02-16 HISTORY — DX: Depression, unspecified: F32.A

## 2011-02-16 NOTE — Telephone Encounter (Signed)
I talked with pt's daughter. She states pt has had blood seeping out of her ear canal since about 11:30 this morning. Pt's daughter states earlier today  pt felt some drainage in her ear and used a Qtip in her ear. Since then, pt has had bloody drainage from her ear. She denies an earache.I reviewed with Dr Shirlee Latch. He recommended pt go to Urgent Care or her PCP to have her ear checked.  Pt's daughter is aware of Dr Alford Highland recommendation to go to Urgent Care or her PCP.

## 2011-02-16 NOTE — Telephone Encounter (Signed)
Daughter is concerned that Vanessa Proctor is having a medication reaction due to blood draining from her ear.  She is requesting a call from Dr Noreene Filbert nurse about coming in to see Dr Lucien Mons for this.

## 2011-02-16 NOTE — Telephone Encounter (Signed)
Should continue on Plavix.  She can see me tomorrow if ear continues to bleed.

## 2011-02-16 NOTE — Cardiovascular Report (Signed)
  NAMEMarland Kitchen  KEEANNA, VILLAFRANCA NO.:  [id number]  MEDICAL RECORD NO.:  [id number]  LOCATION:  2506                         FACILITY:  [facility]  PHYSICIAN:  Verne Carrow, MDDATE OF BIRTH:  [birth year]  DATE OF PROCEDURE:  02/13/2011 DATE OF DISCHARGE:                           CARDIAC CATHETERIZATION   PRIMARY CARDIOLOGIST:  Marca Ancona, MD  PROCEDURES PERFORMED:  PTCA with placement of a drug-eluting stent in the mid right coronary artery.  OPERATOR:  Verne Carrow, MD  INDICATION:  This is a 55 year old African American female with a history of hypertension and tobacco abuse who presented to [facility] last night with complaints of chest pain and headache.  Her initial EKG showed ST elevation, however it quickly resolved.  The patient had no complaints of chest pain after her EKG normalized.  Her diagnostic catheterization was performed this morning by Dr. Marca Ancona.  The patient was loaded with 600 mg of Plavix, started on IV nitroglycerin and IV heparin last night.  Her diagnostic catheterizations showed severe stenosis in the mid right coronary artery with haziness consistent with thrombus burden.  Dr. Shirlee Latch had performed the diagnostic procedure and asked me to assist with intervention.  DETAILS OF PROCEDURE:  When I entered the case, the patient had a 6- [place] sheath present in the right radial artery.  She had been given a bolus of Angiomax and drip was started.  ACT was greater than 200.  We then selectively engaged the native right coronary artery with a 6- [place] JR4 guiding catheter with side holes.  A cougar intracoronary wire was passed down the length of the right coronary artery into the distal vessel.  A 2.0 x 12 mm balloon was used to predilate the lesion. A 2.25 x 20 mm Promus Element drug-eluting stent was carefully positioned in the mid vessel and deployed without difficulty.  A 2.5 x 15 mm noncompliant balloon was  inflated twice within the stented segment.  The stenosis was taken from 99% to 0%.  There was excellent flow into the distal vessel.  There were no immediate complications. The sheath was removed from the right radial artery and a Terumo hemostasis band was applied to the arteriotomy site.  IMPRESSION:  Successful percutaneous transluminal coronary angioplasty with placement of drug-eluting stent in the mid right coronary artery.  RECOMMENDATIONS:  The patient should be continued on aspirin and Plavix for at least 1 year.  We will continue her beta-blocker and statin medication.  Tobacco cessation is encouraged.     Verne Carrow, MD     CM/MEDQ  D:  02/13/2011  T:  02/13/2011  Job:  [id number]  cc:   Marca Ancona, MD  Electronically Signed by Verne Carrow MD on 02/16/2011 09:44:14 AM

## 2011-02-16 NOTE — ED Notes (Signed)
Pt presents to department for evaluation of bleeding to R ear. Ongoing since this morning. Pt was recently discharged from hospital after suffering MI. Was started on Plavix. Pt states she noticed bright red bleeding from R ear this morning. 2/10 pain at the time. Pt states muffled hearing from R ear. She is alert and oriented x4. No signs of distress at the time.

## 2011-02-16 NOTE — ED Notes (Signed)
She has had bleeding from her rt ear since this am.  She was just discharged from the hospital Saturday.Marland Kitchen  She was taking heparin while in the hospital

## 2011-02-16 NOTE — Telephone Encounter (Signed)
New message -pt's daughter called about her mom she said her ear is bleeding and wanted to talk to you about it. Pt has seen Dr Myrtis Ser in hospital. Please call

## 2011-02-17 ENCOUNTER — Emergency Department (HOSPITAL_COMMUNITY): Payer: Self-pay

## 2011-02-17 LAB — POCT I-STAT, CHEM 8
BUN: 19 mg/dL (ref 6–23)
Calcium, Ion: 1.28 mmol/L (ref 1.12–1.32)
Chloride: 102 mEq/L (ref 96–112)
Creatinine, Ser: 1.1 mg/dL (ref 0.50–1.10)
Glucose, Bld: 112 mg/dL — ABNORMAL HIGH (ref 70–99)
HCT: 41 % (ref 36.0–46.0)
Hemoglobin: 13.9 g/dL (ref 12.0–15.0)
Potassium: 3.8 mEq/L (ref 3.5–5.1)
Sodium: 141 mEq/L (ref 135–145)
TCO2: 30 mmol/L (ref 0–100)

## 2011-02-17 LAB — CBC
HCT: 38.5 % (ref 36.0–46.0)
Hemoglobin: 12.8 g/dL (ref 12.0–15.0)
MCH: 28.4 pg (ref 26.0–34.0)
MCHC: 33.2 g/dL (ref 30.0–36.0)
MCV: 85.6 fL (ref 78.0–100.0)
Platelets: 215 10*3/uL (ref 150–400)
RBC: 4.5 MIL/uL (ref 3.87–5.11)
RDW: 13.7 % (ref 11.5–15.5)
WBC: 6.8 10*3/uL (ref 4.0–10.5)

## 2011-02-17 LAB — PROTIME-INR
INR: 0.95 (ref 0.00–1.49)
Prothrombin Time: 12.9 seconds (ref 11.6–15.2)

## 2011-02-17 LAB — DIFFERENTIAL
Basophils Absolute: 0 10*3/uL (ref 0.0–0.1)
Basophils Relative: 0 % (ref 0–1)
Eosinophils Absolute: 0.4 10*3/uL (ref 0.0–0.7)
Eosinophils Relative: 6 % — ABNORMAL HIGH (ref 0–5)
Lymphocytes Relative: 38 % (ref 12–46)
Lymphs Abs: 2.6 10*3/uL (ref 0.7–4.0)
Monocytes Absolute: 0.8 10*3/uL (ref 0.1–1.0)
Monocytes Relative: 12 % (ref 3–12)
Neutro Abs: 3 10*3/uL (ref 1.7–7.7)
Neutrophils Relative %: 44 % (ref 43–77)

## 2011-02-17 MED ORDER — AMOXICILLIN 500 MG PO CAPS
1000.0000 mg | ORAL_CAPSULE | Freq: Once | ORAL | Status: AC
  Administered 2011-02-17: 1000 mg via ORAL
  Filled 2011-02-17: qty 2

## 2011-02-17 MED ORDER — HYDROCODONE-ACETAMINOPHEN 5-325 MG PO TABS: 1.0000 | ORAL_TABLET | Freq: Four times a day (QID) | ORAL | Status: AC | PRN

## 2011-02-17 MED ORDER — AMOXICILLIN 500 MG PO CAPS: 1000.0000 mg | ORAL_CAPSULE | Freq: Three times a day (TID) | ORAL | Status: AC

## 2011-02-17 NOTE — ED Provider Notes (Addendum)
Medical screening examination/treatment/procedure(s) were conducted as a shared visit with non-physician practitioner(s) and myself.  I personally evaluated the patient during the encounter  Blood and clear fluid present in the right external auditory canal consistent with ruptured tympanic membrane.  Hanley Seamen, MD 02/17/11 0454  Hanley Seamen, MD 02/17/11 0981

## 2011-02-17 NOTE — ED Provider Notes (Signed)
History     CSN: [id number] Arrival date & time: 02/16/2011  5:28 PM   First MD Initiated Contact with Patient 02/17/11 0047      Chief Complaint  Patient presents with  . Otalgia    (Consider location/radiation/quality/duration/timing/severity/associated sxs/prior treatment) Patient is a 55 y.o. female presenting with ear pain. The history is provided by the patient.  Otalgia This is a new problem. The current episode started 12 to 24 hours ago. There is pain in the right ear. The problem occurs constantly. The problem has not changed since onset.There has been no fever.    Past Medical History  Diagnosis Date  . Hypertension   . Depression     History reviewed. No pertinent past surgical history.  History reviewed. No pertinent family history.  History  Substance Use Topics  . Smoking status: Current Everyday Smoker  . Smokeless tobacco: Not on file  . Alcohol Use: No    OB History    Grav Para Term Preterm Abortions TAB SAB Ect Mult Living                  Review of Systems  Constitutional: Negative for activity change.  HENT: Positive for ear pain.   Eyes: Negative.   Respiratory: Negative.   Cardiovascular: Negative.   Gastrointestinal: Negative.   Genitourinary: Negative.   Musculoskeletal: Negative.   Neurological: Negative.   Hematological: Negative.   Psychiatric/Behavioral: Negative.     Allergies  Review of patient's allergies indicates no known allergies.  Home Medications   Current Outpatient Rx  Name Route Sig Dispense Refill  . CITALOPRAM HYDROBROMIDE 40 MG PO TABS Oral Take 40 mg by mouth every other day.      Marland Kitchen HYDROCHLOROTHIAZIDE 12.5 MG PO CAPS Oral Take 12.5 mg by mouth daily.      Marland Kitchen HYDROXYZINE HCL 25 MG PO TABS Oral Take 25 mg by mouth 2 (two) times daily as needed. For itching       BP 129/69  Pulse 67  Temp(Src) 98.2 F (36.8 C) (Oral)  Resp 16  SpO2 100%  Physical Exam  Constitutional: She is oriented to person,  place, and time. She appears well-developed.  HENT:  Right Ear: There is hemotympanum.  Eyes: EOM are normal.  Neck: Neck supple.  Cardiovascular: Normal rate.   Pulmonary/Chest: Breath sounds normal.  Neurological: She is alert and oriented to person, place, and time.  Skin: Skin is warm.    ED Course  Procedures (including critical care time)   Labs Reviewed  CBC  DIFFERENTIAL  PROTIME-INR   No results found.   No diagnosis found.    MDM  Ruptures TM Blood in canal no tenderness will check CBC and INR as patient was on Heparin until discharge now on Plavix         Arman Filter, NP 02/17/11 0104

## 2011-02-17 NOTE — ED Notes (Signed)
Per pt and daughter - pt recently d/c'd on Saturday from [facility] for MI and cardiac stent - pt placed on plavix at that time - pt began having rt ear bleeding - no mechanism of injury - pt's daughter states bleeding started approx 11:00am 02/16/2011 and noted clots from ear as well. Pt admits to minor pain/discomfort. Pt resting comfortably on bed in no acute distress, family at bedside.

## 2011-02-25 NOTE — Discharge Summary (Signed)
NAMEMarland Kitchen  Vanessa Proctor, Vanessa Proctor NO.:  [id number]  MEDICAL RECORD NO.:  [id number]  LOCATION:  2506                         FACILITY:  [facility]  PHYSICIAN:  Vesta Mixer, M.D. DATE OF BIRTH:  December 17, 1955  DATE OF ADMISSION:  02/12/2011 DATE OF DISCHARGE:  02/14/2011                              DISCHARGE SUMMARY   PRIMARY CARE PHYSICIAN:  None.  PRIMARY CARDIOLOGIST:  Marca Ancona, MD  DISCHARGE DIAGNOSIS:  Inferior ST-elevation myocardial infarction status post percutaneous transluminal coronary angioplasty with drug-eluting stent to the right coronary artery.  SECONDARY DIAGNOSES: 1. Hyperlipidemia. 2. Hypertension. 3. Tobacco abuse. 4. Depression.  ALLERGIES:  No known drug allergies.  PROCEDURES: 1. Left heart catheterization with coronary angiography and left     ventriculography on February 13, 2011.     a.     99% mid RCA stenosis with thrombus.  Large first obtuse      marginal with a 40% proximal stenosis.  There was about 30% mid      LAD stenosis with luminal irregularities.  EF was estimated to be      55% to 60% with basal inferior hypokinesis.      b.    A 2.25 x 20 mm PROMUS Element drug-eluting stent was placed in the mid right     coronary artery. 3. CT head without contrast on February 12, 2011.     a.     No evidence of acute intracranial hemorrhage or mass      lesions.  HISTORY OF PRESENT ILLNESS:  Vanessa Proctor is a 55 year old female with a history of hypertension and tobacco abuse who presented to the emergency department on February 12, 2011, with chest pain, headache, and transient ST elevation inferiorly.  At baseline, she does report getting short of breath and had occasional chest pressure when walking uphill. On the date of admission, she was walking uphill from her bus stop when she became more short of breath than normal, went and laid down where she developed very severe substernal chest heaviness and pressure that was  nonradiating.  She fell asleep for about an hour and woke up and the pressure was still present and became very severe.  She developed nausea and vomited once.  At that time, she also developed severe headache and dizziness.  EMS was called and when they arrived, an EKG showed inferior ST-elevation at which point a code STEMI was activated and she received 1 nitroglycerin and 325mg  ASA.  Upon presentation to the emergency department, her chest pain and the ST elevation had resolved completely.  In the emergency department, her cardiac enzymes showed a troponin-I of 0.44 with a CK of 101 and a CK-MB of 3.4.  Due to her headache, a CT of the head without contrast was performed, which was negative for any intracranial hemorrhage at which point heparin drip and Plavix were administered as well as a nitroglycerin drip.  UA drug screen was negative for cocaine and a beta-blocker was also started.  She was then admitted to the hospital for cardiac catheterization.  HOSPITAL COURSE:  During admission, a left heart catheterization with was performed.  Her EF was estimated to be 55% to 60% with basal  inferior hypokinesis.  There was a 99% mid RCA stenosis with thrombus, as well as a large first obtuse marginal with 40% proximal stenosis and about 30% mid LAD stenosis. PTCA was then performed with placement of a 2.2 x 20 PROMUS Element drug- eluting stent to the mid RCA.  The stenosis was taken from 99% to 0%. An echocardiogram was also performed, which showed an EF of 65% to 70% with normal systolic function.  During hospitalization, she was also initiated on a statin and received a nicotine patch.  On discharge, her EKG showed sinus bradycardia with a rate of 58 and minimal ST-elevation diffusely.  On the day of discharge, the patient was feeling well, without complaints of chest pain, dizziness, or headaches.  The patient was able to ambulate without difficulty.  DISCHARGE LABS:  White blood cell  count 6.4, hemoglobin 11.8, hematocrit 36.4, platelets 206.  INR 1.04.  Sodium 139, potassium 4.2, chloride 107, CO2 of 25, BUN 15, creatinine 0.79, glucose 98, calcium 9.6. Hemoglobin A1c 6.1.  CK 158, CK-MB 10.9.  Troponin 1.67.  Peak troponin 1.76.  Total cholesterol 184, HDL 51, LDL 121.  TSH 0.73.  Drug screen positive for opioids and tetrahydrocannabinol.  MRSA negative.  DISPOSITION:  The patient will be discharged in good condition to home.  FOLLOWUP PLANS AND APPOINTMENTS:  Vanessa Proctor will follow up with Dr. Shirlee Latch at [facility] in about 2 weeks.  She will follow up with [facility] on April 10, 2011, at 11:15 and subsequently with Dr. Philipp Deputy at [facility] on April 21, 2011, at 8:30.  DISCHARGE MEDICATIONS: 1. Aspirin 81 mg by mouth daily. 2. Plavix 75 mg by mouth daily with meals. 3. Atorvastatin 80 mg by mouth daily at bedtime. 4. Metoprolol tartrate 25 mg by mouth twice daily. 5. Nicotine patch 14 mg x2 weeks then 10 mg x4 weeks transdermally. 6. Nitroglycerin 0.4 mg sublingually every 5 minutes x3 as needed. 7. Celexa 40 mg by mouth every other day. 8. Hydrochlorothiazide 12.5 mg by mouth daily. 9. Hydroxyzine by mouth twice daily as needed for itching.  Duration of discharge encounter 40 minutes including MD time.    ______________________________ Berton Mount, PA-C   ______________________________ Vesta Mixer, M.D.    JH/MEDQ  D:  02/14/2011  T:  02/14/2011  Job:  [id number]  cc:   Marca Ancona, MD Pablo Lawrence. Philipp Deputy, M.D.  Electronically Signed by Berton Mount PA on 02/16/2011 01:05:32 PM Electronically Signed by Kristeen Miss M.D. on 02/25/2011 09:47:16 AM

## 2011-03-08 ENCOUNTER — Emergency Department (HOSPITAL_COMMUNITY)
Admission: EM | Admit: 2011-03-08 | Discharge: 2011-03-08 | Discharge status: Against Medical Advice | Payer: Self-pay | Attending: Emergency Medicine | Admitting: Emergency Medicine

## 2011-03-08 ENCOUNTER — Emergency Department (HOSPITAL_COMMUNITY): Payer: Medicaid Other

## 2011-03-08 ENCOUNTER — Inpatient Hospital Stay (HOSPITAL_COMMUNITY)
Admit: 2011-03-08 | Discharge: 2011-03-11 | DRG: 066 | Disposition: A | Discharge status: Home / Self Care | Payer: Medicaid Other | Attending: Internal Medicine | Admitting: Internal Medicine

## 2011-03-08 ENCOUNTER — Other Ambulatory Visit: Payer: Self-pay

## 2011-03-08 ENCOUNTER — Encounter (HOSPITAL_COMMUNITY): Payer: Self-pay | Admitting: *Deleted

## 2011-03-08 DIAGNOSIS — Z7982 Long term (current) use of aspirin: Secondary | ICD-10-CM

## 2011-03-08 DIAGNOSIS — F17200 Nicotine dependence, unspecified, uncomplicated: Secondary | ICD-10-CM | POA: Diagnosis present

## 2011-03-08 DIAGNOSIS — E785 Hyperlipidemia, unspecified: Secondary | ICD-10-CM

## 2011-03-08 DIAGNOSIS — T7411XA Adult physical abuse, confirmed, initial encounter: Secondary | ICD-10-CM | POA: Diagnosis present

## 2011-03-08 DIAGNOSIS — I10 Essential (primary) hypertension: Secondary | ICD-10-CM | POA: Diagnosis present

## 2011-03-08 DIAGNOSIS — R4789 Other speech disturbances: Secondary | ICD-10-CM | POA: Diagnosis present

## 2011-03-08 DIAGNOSIS — E669 Obesity, unspecified: Secondary | ICD-10-CM | POA: Diagnosis present

## 2011-03-08 DIAGNOSIS — I498 Other specified cardiac arrhythmias: Secondary | ICD-10-CM | POA: Diagnosis present

## 2011-03-08 DIAGNOSIS — I639 Cerebral infarction, unspecified: Secondary | ICD-10-CM

## 2011-03-08 DIAGNOSIS — I252 Old myocardial infarction: Secondary | ICD-10-CM

## 2011-03-08 DIAGNOSIS — R29810 Facial weakness: Secondary | ICD-10-CM | POA: Diagnosis present

## 2011-03-08 DIAGNOSIS — F329 Major depressive disorder, single episode, unspecified: Secondary | ICD-10-CM | POA: Diagnosis present

## 2011-03-08 DIAGNOSIS — F3289 Other specified depressive episodes: Secondary | ICD-10-CM | POA: Diagnosis present

## 2011-03-08 DIAGNOSIS — Z7902 Long term (current) use of antithrombotics/antiplatelets: Secondary | ICD-10-CM

## 2011-03-08 DIAGNOSIS — F32A Depression, unspecified: Secondary | ICD-10-CM | POA: Diagnosis present

## 2011-03-08 DIAGNOSIS — R252 Cramp and spasm: Secondary | ICD-10-CM | POA: Diagnosis present

## 2011-03-08 DIAGNOSIS — R51 Headache: Secondary | ICD-10-CM | POA: Diagnosis present

## 2011-03-08 DIAGNOSIS — I6350 Cerebral infarction due to unspecified occlusion or stenosis of unspecified cerebral artery: Principal | ICD-10-CM | POA: Diagnosis present

## 2011-03-08 DIAGNOSIS — R471 Dysarthria and anarthria: Secondary | ICD-10-CM | POA: Diagnosis present

## 2011-03-08 DIAGNOSIS — I2510 Atherosclerotic heart disease of native coronary artery without angina pectoris: Secondary | ICD-10-CM

## 2011-03-08 DIAGNOSIS — Z8673 Personal history of transient ischemic attack (TIA), and cerebral infarction without residual deficits: Secondary | ICD-10-CM | POA: Diagnosis present

## 2011-03-08 HISTORY — DX: Acute myocardial infarction, unspecified: I21.9

## 2011-03-08 LAB — CBC
HCT: 40.2 % (ref 36.0–46.0)
Hemoglobin: 13.3 g/dL (ref 12.0–15.0)
MCH: 28.4 pg (ref 26.0–34.0)
MCHC: 33.1 g/dL (ref 30.0–36.0)
MCV: 85.7 fL (ref 78.0–100.0)
Platelets: 246 10*3/uL (ref 150–400)
RBC: 4.69 MIL/uL (ref 3.87–5.11)
RDW: 13.8 % (ref 11.5–15.5)
WBC: 7.4 10*3/uL (ref 4.0–10.5)

## 2011-03-08 LAB — BASIC METABOLIC PANEL
BUN: 13 mg/dL (ref 6–23)
CO2: 27 mEq/L (ref 19–32)
Calcium: 10 mg/dL (ref 8.4–10.5)
Chloride: 98 mEq/L (ref 96–112)
Creatinine, Ser: 0.77 mg/dL (ref 0.50–1.10)
GFR calc Af Amer: >90 mL/min (ref >90)
GFR calc non Af Amer: >90 mL/min (ref >90)
Glucose, Bld: 115 mg/dL — ABNORMAL HIGH (ref 70–99)
Potassium: 3.5 mEq/L (ref 3.5–5.1)
Sodium: 135 mEq/L (ref 135–145)

## 2011-03-08 NOTE — ED Provider Notes (Signed)
History    55yf with with stroke like symptoms. Onset unclear. Daughter thinks around 1500 today. Noticed that pt's face looked "funny." Pt took nap this afternoon and when woke up face seemed worse and speech not clear. Pt c/o of dysarthria. Mild diffuse HA. No n/v. No numbness or tingling. Has been ambulatory. N confusion per daughter, just speech not clear as usually is. Pt with recent hospitalization for MI. No hx of CVA.   CSN: [id number] Arrival date & time: 03/08/2011  9:54 PM   First MD Initiated Contact with Patient 03/08/11 2211      No chief complaint on file.   (Consider location/radiation/quality/duration/timing/severity/associated sxs/prior treatment) HPI  Past Medical History  Diagnosis Date  . Hypertension   . Depression   . Myocardial infarction     november 1st 2012    History reviewed. No pertinent past surgical history.  History reviewed. No pertinent family history.  History  Substance Use Topics  . Smoking status: Current Everyday Smoker  . Smokeless tobacco: Not on file  . Alcohol Use: No    OB History    Grav Para Term Preterm Abortions TAB SAB Ect Mult Living                  Review of Systems   Review of symptoms negative unless otherwise noted in HPI.   Allergies  Bactrim  Home Medications   Current Outpatient Rx  Name Route Sig Dispense Refill  . CLOPIDOGREL BISULFATE 300 MG PO TABS Oral Take 300 mg by mouth once.      Marland Kitchen HYDROCHLOROTHIAZIDE 12.5 MG PO CAPS Oral Take 12.5 mg by mouth daily.      Marland Kitchen HYDROCODONE-ACETAMINOPHEN 10-325 MG PO TABS Oral Take 1 tablet by mouth every 6 (six) hours as needed.      Marland Kitchen METOPROLOL SUCCINATE 50 MG PO TB24 Oral Take 50 mg by mouth daily.      Marland Kitchen NITROGLYCERIN 0.4 MG SL SUBL Sublingual Place 0.4 mg under the tongue every 5 (five) minutes as needed.      Marland Kitchen CITALOPRAM HYDROBROMIDE 40 MG PO TABS Oral Take 40 mg by mouth every other day.      Marland Kitchen HYDROCHLOROTHIAZIDE 12.5 MG PO CAPS Oral Take 12.5 mg by  mouth daily.      Marland Kitchen HYDROXYZINE HCL 25 MG PO TABS Oral Take 25 mg by mouth 2 (two) times daily as needed. For itching       BP 144/95  Pulse 68  Temp 98.4 F (36.9 C)  Resp 18  Wt 175 lb (79.379 kg)  SpO2 100%  Physical Exam  Nursing note and vitals reviewed. Constitutional: She is oriented to person, place, and time. She appears well-developed and well-nourished. No distress.  HENT:  Head: Normocephalic and atraumatic.  Mouth/Throat: Oropharynx is clear and moist. No oropharyngeal exudate.  Eyes: Conjunctivae are normal. Pupils are equal, round, and reactive to light. Right eye exhibits no discharge. Left eye exhibits no discharge.  Neck: Normal range of motion. Neck supple.  Cardiovascular: Normal rate, regular rhythm and normal heart sounds.  Exam reveals no gallop and no friction rub.   No murmur heard. Pulmonary/Chest: Effort normal and breath sounds normal. No respiratory distress.  Abdominal: Soft. She exhibits no distension. There is no tenderness.  Musculoskeletal: She exhibits no edema and no tenderness.  Neurological: She is alert and oriented to person, place, and time.       L facial droop. CN2-12 otherwise intact. Visual fields intact in  all quadrants. Handling secretions. Dysarthria but understandable. L pronator drift. Strength LLE 4/5. Sensation grossly intact to light touch. Good finger-to-nose bilaterally. AOx3.   Skin: Skin is warm and dry. She is not diaphoretic.  Psychiatric: She has a normal mood and affect. Her behavior is normal. Thought content normal.    ED Course  Procedures (including critical care time)  Labs Reviewed  BASIC METABOLIC PANEL - Abnormal; Notable for the following:    Glucose, Bld 115 (*)    All other components within normal limits  CBC  URINALYSIS, ROUTINE W REFLEX MICROSCOPIC   Ct Head Wo Contrast  03/08/2011  *RADIOLOGY REPORT*  Clinical Data: Left sided facial droop.  CT HEAD WITHOUT CONTRAST  Technique:  Contiguous axial  images were obtained from the base of the skull through the vertex without contrast.  Comparison: 02/12/2011  Findings: Stable small vessel disease and old deep white matter and lacunar infarcts bilaterally as seen on the recent scan.  No evidence of acute infarction or hemorrhage.  Stable basal ganglia calcifications.  Ventricles are normal in size.  No extra-axial fluid collections.  No evidence of mass effect or mass lesion.  The skull is unremarkable.  IMPRESSION: Stable small vessel disease and old deep white matter and lacunar infarcts.  No acute findings.  Original Report Authenticated By: Reola Calkins, M.D.   Dg Chest Portable 1 View  03/08/2011  *RADIOLOGY REPORT*  Clinical Data: Recent myocardial infarction; stroke evaluation. History of smoking.  PORTABLE CHEST - 1 VIEW  Comparison: Chest radiograph performed 02/12/2011  Findings: The lungs are well-aerated and clear.  There is no evidence of focal opacification, pleural effusion or pneumothorax.  The cardiomediastinal silhouette is within normal limits.  No acute osseous abnormalities are seen.  IMPRESSION: No acute cardiopulmonary process seen.  Original Report Authenticated By: Tonia Ghent, M.D.   EKG:  Rhythm: normal sinus with PVCs Rate: 67 Axis: normal Intervals: normal ST segments: NS ST changes. Flipped t wave in III, flattening in aVF which seen on previous   1. CVA (cerebral infarction)       MDM  55yf with CVA. Not TPA candidate given well over 4.5 hour delay in presentation from symptoms onset. CT without acute change. EKG sinus. Pt on plavix. Transfer to [facility] for further eval.        Raeford Razor, MD 03/09/11 [phone]

## 2011-03-08 NOTE — ED Notes (Signed)
Pt presents to [facility] via [facility]OV with stroke like symptoms including slurred speech, left sided facial drooping, and notable left sided weakness.  Per pt daughter, pt last seen normal @ 1500.  Pt taken to [facility].  Dr Juleen China notified of possible stroke, and 2 IV's started immediately.

## 2011-03-08 NOTE — ED Notes (Signed)
Per Pt daughter, pt began having worsening facial drooping around1500 today, Pt noted to also have slurred speech.

## 2011-03-09 ENCOUNTER — Encounter (HOSPITAL_COMMUNITY): Payer: Self-pay | Admitting: Family Medicine

## 2011-03-09 DIAGNOSIS — Z8673 Personal history of transient ischemic attack (TIA), and cerebral infarction without residual deficits: Secondary | ICD-10-CM | POA: Diagnosis present

## 2011-03-09 DIAGNOSIS — I2510 Atherosclerotic heart disease of native coronary artery without angina pectoris: Secondary | ICD-10-CM

## 2011-03-09 DIAGNOSIS — E785 Hyperlipidemia, unspecified: Secondary | ICD-10-CM

## 2011-03-09 LAB — URINALYSIS, ROUTINE W REFLEX MICROSCOPIC
Bilirubin Urine: NEGATIVE
Glucose, UA: NEGATIVE mg/dL
Hgb urine dipstick: NEGATIVE
Ketones, ur: NEGATIVE mg/dL
Leukocytes, UA: NEGATIVE
Nitrite: NEGATIVE
Protein, ur: NEGATIVE mg/dL
Specific Gravity, Urine: 1.007 (ref 1.005–1.030)
Urobilinogen, UA: 1 mg/dL (ref 0.0–1.0)
pH: 7 (ref 5.0–8.0)

## 2011-03-09 LAB — CBC
HCT: 36.2 % (ref 36.0–46.0)
Hemoglobin: 11.9 g/dL — ABNORMAL LOW (ref 12.0–15.0)
MCH: 28.1 pg (ref 26.0–34.0)
MCHC: 32.9 g/dL (ref 30.0–36.0)
MCV: 85.4 fL (ref 78.0–100.0)
Platelets: 208 10*3/uL (ref 150–400)
RBC: 4.24 MIL/uL (ref 3.87–5.11)
RDW: 13.9 % (ref 11.5–15.5)
WBC: 6.2 10*3/uL (ref 4.0–10.5)

## 2011-03-09 LAB — LIPID PANEL
Cholesterol: 114 mg/dL (ref 0–200)
HDL: 48 mg/dL (ref >39)
LDL Cholesterol: 52 mg/dL (ref 0–99)
Total CHOL/HDL Ratio: 2.4 RATIO
Triglycerides: 68 mg/dL (ref <150)
VLDL: 14 mg/dL (ref 0–40)

## 2011-03-09 LAB — BASIC METABOLIC PANEL
BUN: 10 mg/dL (ref 6–23)
CO2: 27 mEq/L (ref 19–32)
Calcium: 8.9 mg/dL (ref 8.4–10.5)
Chloride: 107 mEq/L (ref 96–112)
Creatinine, Ser: 0.77 mg/dL (ref 0.50–1.10)
GFR calc Af Amer: >90 mL/min (ref >90)
GFR calc non Af Amer: >90 mL/min (ref >90)
Glucose, Bld: 98 mg/dL (ref 70–99)
Potassium: 3.9 mEq/L (ref 3.5–5.1)
Sodium: 141 mEq/L (ref 135–145)

## 2011-03-09 LAB — MAGNESIUM: Magnesium: 2 mg/dL (ref 1.5–2.5)

## 2011-03-09 LAB — HEMOGLOBIN A1C
Hgb A1c MFr Bld: 6.1 % — ABNORMAL HIGH (ref <5.7)
Mean Plasma Glucose: 128 mg/dL — ABNORMAL HIGH (ref <117)

## 2011-03-09 LAB — MRSA PCR SCREENING: MRSA by PCR: NEGATIVE

## 2011-03-09 MED ORDER — CLOPIDOGREL BISULFATE 75 MG PO TABS
75.0000 mg | ORAL_TABLET | Freq: Every day | ORAL | Status: DC
  Administered 2011-03-09 – 2011-03-11 (×3): 75 mg via ORAL
  Filled 2011-03-09 (×5): qty 1

## 2011-03-09 MED ORDER — HYDROCODONE-ACETAMINOPHEN 10-325 MG PO TABS
1.0000 | ORAL_TABLET | Freq: Four times a day (QID) | ORAL | Status: DC | PRN
  Administered 2011-03-09 – 2011-03-10 (×3): 1 via ORAL
  Filled 2011-03-09 (×3): qty 1

## 2011-03-09 MED ORDER — ENOXAPARIN SODIUM 40 MG/0.4ML ~~LOC~~ SOLN
40.0000 mg | SUBCUTANEOUS | Status: DC
  Administered 2011-03-09 – 2011-03-11 (×3): 40 mg via SUBCUTANEOUS
  Filled 2011-03-09 (×4): qty 0.4

## 2011-03-09 MED ORDER — SODIUM CHLORIDE 0.9 % IV SOLN
INTRAVENOUS | Status: DC
  Administered 2011-03-09: 02:00:00 via INTRAVENOUS

## 2011-03-09 MED ORDER — NICOTINE 14 MG/24HR TD PT24
14.0000 mg | MEDICATED_PATCH | Freq: Every day | TRANSDERMAL | Status: DC
  Administered 2011-03-09 – 2011-03-11 (×3): 14 mg via TRANSDERMAL
  Filled 2011-03-09 (×3): qty 1

## 2011-03-09 MED ORDER — CITALOPRAM HYDROBROMIDE 40 MG PO TABS
40.0000 mg | ORAL_TABLET | ORAL | Status: DC
  Administered 2011-03-09 – 2011-03-11 (×2): 40 mg via ORAL
  Filled 2011-03-09 (×2): qty 1

## 2011-03-09 MED ORDER — POTASSIUM CHLORIDE IN NACL 20-0.45 MEQ/L-% IV SOLN
INTRAVENOUS | Status: DC
  Administered 2011-03-09: 75 mL/h via INTRAVENOUS
  Administered 2011-03-09 – 2011-03-10 (×2): via INTRAVENOUS
  Filled 2011-03-09 (×6): qty 1000

## 2011-03-09 MED ORDER — ASPIRIN 325 MG PO TABS
325.0000 mg | ORAL_TABLET | Freq: Every day | ORAL | Status: DC
  Administered 2011-03-09 – 2011-03-10 (×2): 325 mg via ORAL
  Filled 2011-03-09 (×2): qty 1

## 2011-03-09 MED ORDER — ONDANSETRON HCL 4 MG/2ML IJ SOLN: 4.0000 mg | Freq: Three times a day (TID) | INTRAMUSCULAR | Status: DC | PRN

## 2011-03-09 MED ORDER — HYDROXYZINE HCL 25 MG PO TABS
25.0000 mg | ORAL_TABLET | Freq: Two times a day (BID) | ORAL | Status: DC | PRN
  Filled 2011-03-09: qty 1

## 2011-03-09 MED ORDER — NITROGLYCERIN 0.4 MG SL SUBL: 0.4000 mg | SUBLINGUAL_TABLET | SUBLINGUAL | Status: DC | PRN

## 2011-03-09 MED ORDER — ENALAPRILAT 1.25 MG/ML IV SOLN
1.2500 mg | Freq: Four times a day (QID) | INTRAVENOUS | Status: DC | PRN
  Filled 2011-03-09: qty 1

## 2011-03-09 MED ORDER — METOPROLOL TARTRATE 50 MG PO TABS
50.0000 mg | ORAL_TABLET | Freq: Two times a day (BID) | ORAL | Status: DC
  Administered 2011-03-09 – 2011-03-11 (×5): 50 mg via ORAL
  Filled 2011-03-09 (×6): qty 1

## 2011-03-09 MED ORDER — SIMVASTATIN 80 MG PO TABS
80.0000 mg | ORAL_TABLET | Freq: Every day | ORAL | Status: DC
  Administered 2011-03-09 – 2011-03-11 (×3): 80 mg via ORAL
  Filled 2011-03-09 (×3): qty 1
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
## 2011-03-09 NOTE — Progress Notes (Signed)
Utilization Review Completed.  Vanessa Proctor  03/09/2011 

## 2011-03-09 NOTE — Progress Notes (Signed)
[facility] hospitalist transfer note for Dr. Gonzella Lex. History of present illness. This elderly female was seen at [facility] for speech changes thought secondary to CVA. She was stable and moved in transfer to [facility]. I saw the patient to ensure that her orders so transferred up properly and that the patient was stable. The patient was seen at the bedside and I note her daughter there as well. The patient was reluctant to speak with me but the daughter states that the speech changes have not yet resolved. No tendo focal weakness. Vitals signs. Temperature 97.9, pulse 61, respirations 20, blood pressure 123/68. O2 sats are 100%. Gen. appearance. Obese female alert and in no distress. Cardiac. Rate and rhythm regular. No jugular venous distention or edema. Lungs. Breath sounds are clear but reduced. No distress and stable O2 sats. Abdomen. Soft obese with positive bowel sounds. No guarding or rebound tenderness. Impression/plan #1. The patient appears quite stable post transfer. She is due to have an MRI and carotid Dopplers this a.m. I did go ahead and at her home medication of Plavix. On no other acute issues at this time.

## 2011-03-09 NOTE — Consult Note (Addendum)
Reason for Consult: "new left facial droop and slurred speech"  HPI: Vanessa Proctor is an 55 y.o. female. Who developed left facial droop and slurred speech yesterday at 11 am that got worse by 3 pm and was associated with confusion, which prompted family to go to ED. I was called this am to see the patient for a stroke consult. Patient also complained of new headache that is now being managed by pain medications. No sensory loss, no dysmetria, no balance problems, no double vision or blurry vision.   Past Medical History  Diagnosis Date  . Hypertension   . Depression   . Myocardial infarction     november 1st 2012   PSH: History reviewed. No pertinent past surgical history.  Family History  Problem Relation Age of Onset  . Coronary artery disease    . Cancer     Social History:  reports that she has been smoking.  She reports that she does not drink alcohol or use illicit drugs. She has been a heavy drinker until quitting 4 to 5 years ago.   Allergies:  Allergies  Allergen Reactions  . Bactrim    Medications: I have reviewed the patient's current medications.  ROS: as above  Blood pressure 123/68, pulse 61, temperature 97.9 F (36.6 C), temperature source Oral, resp. rate 20, height 5\' 1"  (1.549 m), weight 80.6 kg (177 lb 11.1 oz), SpO2 100.00%.  Neurological exam: AAO*3. No aphasia.  Was able to tell me months of the year forwards and backwards correctly, exhibiting good attention span. Recall was 3 of 3 after 5 minutes. Followed complex commands. Cranial nerves: EOMI, PERRL. Visual fields were full. Sensation to V1 through V3 areas of the face was intact and symmetric throughout. There was left facial droop. Hearing to finger rub was equal and symmetrical bilaterally. Shoulder shrug was 5/5 and symmetric bilaterally. Head rotation was 5/5 bilaterally. There was no dysarthria or palatal deviation. Motor: strength was 5/5 and symmetric throughout except left deltoid 4/5 and  right IP 5-/5. Sensory: was intact throughout to light touch, pinprick, vibration and proprioception. Coordination: finger-to-nose and heel-to-shin were intact and symmetric bilaterally. Reflexes: were 2+ in upper extremities and 1+ at the knees and 1+ at the ankles. Plantar response was downgoing bilaterally. Gait: Romberg test was negative. There was no ataxia noted. Patient able to stand on heels and toes, but could not tandem.   Results for orders placed during the hospital encounter of 03/08/11 (from the past 48 hour(s))  CBC     Status: Normal   Collection Time   03/08/11 10:15 PM      Component Value Range Comment   WBC 7.4  4.0 - 10.5 (K/uL)    RBC 4.69  3.87 - 5.11 (MIL/uL)    Hemoglobin 13.3  12.0 - 15.0 (g/dL)    HCT 16.1  09.6 - 04.5 (%)    MCV 85.7  78.0 - 100.0 (fL)    MCH 28.4  26.0 - 34.0 (pg)    MCHC 33.1  30.0 - 36.0 (g/dL)    RDW 40.9  81.1 - 91.4 (%)    Platelets 246  150 - 400 (K/uL)   BASIC METABOLIC PANEL     Status: Abnormal   Collection Time   03/08/11 10:15 PM      Component Value Range Comment   Sodium 135  135 - 145 (mEq/L)    Potassium 3.5  3.5 - 5.1 (mEq/L)    Chloride 98  96 -  112 (mEq/L)    CO2 27  19 - 32 (mEq/L)    Glucose, Bld 115 (*) 70 - 99 (mg/dL)    BUN 13  6 - 23 (mg/dL)    Creatinine, Ser 6.29  0.50 - 1.10 (mg/dL)    Calcium 52.8  8.4 - 10.5 (mg/dL)    GFR calc non Af Amer >90  >90 (mL/min)    GFR calc Af Amer >90  >90 (mL/min)   MAGNESIUM     Status: Normal   Collection Time   03/08/11 10:15 PM      Component Value Range Comment   Magnesium 2.0  1.5 - 2.5 (mg/dL)   URINALYSIS, ROUTINE W REFLEX MICROSCOPIC     Status: Normal   Collection Time   03/09/11  1:06 AM      Component Value Range Comment   Color, Urine YELLOW  YELLOW     Appearance CLEAR  CLEAR     Specific Gravity, Urine 1.007  1.005 - 1.030     pH 7.0  5.0 - 8.0     Glucose, UA NEGATIVE  NEGATIVE (mg/dL)    Hgb urine dipstick NEGATIVE  NEGATIVE     Bilirubin Urine  NEGATIVE  NEGATIVE     Ketones, ur NEGATIVE  NEGATIVE (mg/dL)    Protein, ur NEGATIVE  NEGATIVE (mg/dL)    Urobilinogen, UA 1.0  0.0 - 1.0 (mg/dL)    Nitrite NEGATIVE  NEGATIVE     Leukocytes, UA NEGATIVE  NEGATIVE  MICROSCOPIC NOT DONE ON URINES WITH NEGATIVE PROTEIN, BLOOD, LEUKOCYTES, NITRITE, OR GLUCOSE <1000 mg/dL.  MRSA PCR SCREENING     Status: Normal   Collection Time   03/09/11  5:04 AM      Component Value Range Comment   MRSA by PCR NEGATIVE  NEGATIVE    CBC     Status: Abnormal   Collection Time   03/09/11  8:04 AM      Component Value Range Comment   WBC 6.2  4.0 - 10.5 (K/uL)    RBC 4.24  3.87 - 5.11 (MIL/uL)    Hemoglobin 11.9 (*) 12.0 - 15.0 (g/dL)    HCT 41.3  24.4 - 01.0 (%)    MCV 85.4  78.0 - 100.0 (fL)    MCH 28.1  26.0 - 34.0 (pg)    MCHC 32.9  30.0 - 36.0 (g/dL)    RDW 27.2  53.6 - 64.4 (%)    Platelets 208  150 - 400 (K/uL)    Ct Head Wo Contrast  03/08/2011  *RADIOLOGY REPORT*  Clinical Data: Left sided facial droop.  CT HEAD WITHOUT CONTRAST  Technique:  Contiguous axial images were obtained from the base of the skull through the vertex without contrast.  Comparison: 02/12/2011  Findings: Stable small vessel disease and old deep white matter and lacunar infarcts bilaterally as seen on the recent scan.  No evidence of acute infarction or hemorrhage.  Stable basal ganglia calcifications.  Ventricles are normal in size.  No extra-axial fluid collections.  No evidence of mass effect or mass lesion.  The skull is unremarkable.  IMPRESSION: Stable small vessel disease and old deep white matter and lacunar infarcts.  No acute findings.  Original Report Authenticated By: Reola Calkins, M.D.   Dg Chest Portable 1 View  03/08/2011  *RADIOLOGY REPORT*  Clinical Data: Recent myocardial infarction; stroke evaluation. History of smoking.  PORTABLE CHEST - 1 VIEW  Comparison: Chest radiograph performed 02/12/2011  Findings: The lungs are  well-aerated and clear.  There is  no evidence of focal opacification, pleural effusion or pneumothorax.  The cardiomediastinal silhouette is within normal limits.  No acute osseous abnormalities are seen.  IMPRESSION: No acute cardiopulmonary process seen.  Original Report Authenticated By: Tonia Ghent, M.D.   Assessment/Plan: 55 years old woman with new left facial droop, dysarthria and right arm and right leg proximal weakness - likely secondary to CVA 1) asa 2) mri/mra BRAIN 3) Echo/CD/Telemetry 4) Heart Healthy Diet 5) PT/SS 6) F/U A1C 7) Can give NS at 75 cc/hr for 2 hours - would continue metoprolol for due to recent MI 8) SubQ Heparin until ambulatory 9) Stroke team will follow 10) Neurochecks and vital signs q4h for next 24 hours  Ercell Perlman 03/09/2011, 8:51 AM
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
## 2011-03-09 NOTE — Plan of Care (Signed)
Problem: Discharge Progression Outcomes Goal: Activity appropriate for discharge plan Outcome: Progressing PT eval completed.  Pt requests to speak to CSW about possibility of disability/ Medicaid and what her financial options are.  Recommend single point cane for d/c home and cardiac rehab outpt if possible, if not, HHPT.  PT to follow. Lyanne Co, PT  Acute Rehab Services  [phone]

## 2011-03-09 NOTE — Progress Notes (Addendum)
Subjective: Patient seen and examined this am. informs some weakness over left side.   Objective:  Vital signs in last 24 hours:  Filed Vitals:   03/09/11 0355 03/09/11 0620 03/09/11 1001 03/09/11 1030  BP: 152/78 123/68 130/76 148/66  Pulse: 61 61 76 60  Temp: 98 F (36.7 C) 97.9 F (36.6 C)  97.7 F (36.5 C)  TempSrc: Oral Oral  Oral  Resp: 22 20  20   Height: 5\' 1"  (1.549 m)     Weight: 80.6 kg (177 lb 11.1 oz)     SpO2: 97% 100%  96%    Intake/Output from previous day:   Intake/Output Summary (Last 24 hours) at 03/09/11 1340 Last data filed at 03/09/11 0900  Gross per 24 hour  Intake    240 ml  Output      0 ml  Net    240 ml    Physical Exam:  General: , elderly female lying in bed no acute distress. HEENT: no pallor, no icterus, moist oral mucosa, no JVD, no lymphadenopathy. Left facial droop Heart: Normal  s1 &s2  Regular rate and rhythm, without murmurs, rubs, gallops. Lungs: Clear to auscultation bilaterally. Abdomen: Soft, nontender, nondistended, positive bowel sounds. Extremities: No clubbing cyanosis or edema with positive pedal pulses. Neuro: Alert, awake, oriented x3, CN II-XII intact, power 4+5 in RUE proximally, normal power tone and reflexes in all other  Extremities. Normal sensations. Plantars downgoing b/l   Lab Results:  Basic Metabolic Panel:    Component Value Date/Time   NA 141 03/09/2011 0804   K 3.9 03/09/2011 0804   CL 107 03/09/2011 0804   CO2 27 03/09/2011 0804   BUN 10 03/09/2011 0804   CREATININE 0.77 03/09/2011 0804   GLUCOSE 98 03/09/2011 0804   CALCIUM 8.9 03/09/2011 0804   CBC:    Component Value Date/Time   WBC 6.2 03/09/2011 0804   HGB 11.9* 03/09/2011 0804   HCT 36.2 03/09/2011 0804   PLT 208 03/09/2011 0804   MCV 85.4 03/09/2011 0804   NEUTROABS 3.0 02/17/2011 0111   LYMPHSABS 2.6 02/17/2011 0111   MONOABS 0.8 02/17/2011 0111   EOSABS 0.4 02/17/2011 0111   BASOSABS 0.0 02/17/2011 0111    Recent Results (from  the past 240 hour(s))  MRSA PCR SCREENING     Status: Normal   Collection Time   03/09/11  5:04 AM      Component Value Range Status Comment   MRSA by PCR NEGATIVE  NEGATIVE  Final     Studies/Results: Ct Head Wo Contrast  03/08/2011  *RADIOLOGY REPORT*  Clinical Data: Left sided facial droop.  CT HEAD WITHOUT CONTRAST  Technique:  Contiguous axial images were obtained from the base of the skull through the vertex without contrast.  Comparison: 02/12/2011  Findings: Stable small vessel disease and old deep white matter and lacunar infarcts bilaterally as seen on the recent scan.  No evidence of acute infarction or hemorrhage.  Stable basal ganglia calcifications.  Ventricles are normal in size.  No extra-axial fluid collections.  No evidence of mass effect or mass lesion.  The skull is unremarkable.  IMPRESSION: Stable small vessel disease and old deep white matter and lacunar infarcts.  No acute findings.  Original Report Authenticated By: Reola Calkins, M.D.   Dg Chest Portable 1 View  03/08/2011  *RADIOLOGY REPORT*  Clinical Data: Recent myocardial infarction; stroke evaluation. History of smoking.  PORTABLE CHEST - 1 VIEW  Comparison: Chest radiograph performed 02/12/2011  Findings:  The lungs are well-aerated and clear.  There is no evidence of focal opacification, pleural effusion or pneumothorax.  The cardiomediastinal silhouette is within normal limits.  No acute osseous abnormalities are seen.  IMPRESSION: No acute cardiopulmonary process seen.  Original Report Authenticated By: Tonia Ghent, M.D.    Medications: Scheduled Meds:   . aspirin  325 mg Oral Daily  . citalopram  40 mg Oral QODAY  . clopidogrel  75 mg Oral Q breakfast  . enoxaparin  40 mg Subcutaneous Q24H  . metoprolol tartrate  50 mg Oral BID  . nicotine  14 mg Transdermal Daily  . simvastatin  80 mg Oral Daily  . DISCONTD: sodium chloride   Intravenous STAT   Continuous Infusions:   . 0.45 % NaCl with KCl 20  mEq / L 75 mL/hr at 03/09/11 0511   PRN Meds:.enalaprilat, HYDROcodone-acetaminophen, hydrOXYzine, nitroGLYCERIN, DISCONTD: ondansetron (ZOFRAN) IV  Assessment/Plan: 55 y/o AA female with hx of recent NSTEMI s/p cardiac cath with RCA stenosis and DES placed , HTN , depression presented with acute onset left facial droop and some left UE weakness. patient admitted for [facility] at [facility] and transferred to [facility] for further workup.   PLAN:  acute CVA Patient still ahs left facial droop and mild lt proximal UE  Weakness. Speech intact Head CT unremarkable cotn ASA and plavix  Cont statin Allowing permissive HTN MRI/ MRA brain ordered Follow carotid doppler  patient recently ( 3 wks back) had 2D echo which was normal except for hyperdynamic LV function so would not repeat it. PT eval Cont tele  neurochecks  appreciate neurology eval and recs ( Dr Lyman Speller following)   Depression  cont citalopram  Diet: cardiac  DVT prophylaxis: sq heparin   LOS: 1 day   Alysiana Ethridge 03/09/2011, 1:40 PM
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
## 2011-03-10 ENCOUNTER — Inpatient Hospital Stay (HOSPITAL_COMMUNITY): Payer: Medicaid Other

## 2011-03-10 DIAGNOSIS — I6350 Cerebral infarction due to unspecified occlusion or stenosis of unspecified cerebral artery: Secondary | ICD-10-CM

## 2011-03-10 MED ORDER — ASPIRIN EC 81 MG PO TBEC
81.0000 mg | DELAYED_RELEASE_TABLET | Freq: Every day | ORAL | Status: DC
  Administered 2011-03-11 (×2): 81 mg via ORAL
  Filled 2011-03-10 (×2): qty 1

## 2011-03-10 NOTE — Progress Notes (Signed)
03/10/11 09:22 PT NOTE Pt currently at MRI and vascular lab per RN.  Will follow-up as able.  Thanks.  03/10/2011 Cephus Shelling, PT, DPT [phone]

## 2011-03-10 NOTE — Progress Notes (Signed)
   CARE MANAGEMENT NOTE 03/10/2011  Patient:  Vanessa Proctor, Vanessa Proctor   Account Number:  [id number]  Date Initiated:  03/09/2011  Documentation initiated by:  Junius Creamer  Subjective/Objective Assessment:   adm w cva     Action/Plan:   lives w family, to be seen by dr Philipp Deputy at [facility] for pcp, her pa has recently left, appt w dr Philipp Deputy in january, appt for recert of orange card on 12-28   Anticipated DC Date:  03/11/2011   Anticipated DC Plan:  HOME W HOME HEALTH SERVICES      DC Planning Services  CM consult      [facility] Choice  DURABLE MEDICAL EQUIPMENT   Choice offered to / List presented to:  C-1 Patient   DME arranged  [facility]      DME agency  [facility]        Status of service:   Medicare Important Message given?   (If response is "NO", the following Medicare IM given date fields will be blank) Date Medicare IM given:   Date Additional Medicare IM given:    Discharge Disposition:  HOME/SELF CARE  Per UR Regulation:    Comments:  11/27 spoke w pt and da, pt's orange card has expired, she has appt for recert but agreeable for dorothy timmons w [facility] to see her to see if she can requal sooner, also left vm for [facility] financial co to see pt reg hosp bill and disability, pt's pa ms martin w [facility] has left now pt sched to see dr vollmer in [place], she would like cane but does not want [facility] at this time, left my card w pt, ordered cane from [facility] for del to room, debbie Arneisha Kincannon rn,bsn [phone] 11/26 await results of tests then pt rec for dc planning, debbie Henya Aguallo rn,bsn [phone]

## 2011-03-10 NOTE — Progress Notes (Signed)
Subjective: denies any symptoms today.still has some lt facial droop  Objective:  Vital signs in last 24 hours:  Filed Vitals:   03/09/11 1804 03/09/11 2150 03/10/11 0300 03/10/11 0500  BP: 142/84 150/80 137/85 152/94  Pulse: 54 65 60 60  Temp: 98 F (36.7 C) 98.1 F (36.7 C) 97.6 F (36.4 C) 98.1 F (36.7 C)  TempSrc: Oral Oral Oral Oral  Resp: 20 19 18 18   Height:      Weight:      SpO2:  100% 94% 96%    Intake/Output from previous day:   Intake/Output Summary (Last 24 hours) at 03/10/11 1503 Last data filed at 03/10/11 1100  Gross per 24 hour  Intake   1780 ml  Output      0 ml  Net   1780 ml    Physical Exam:  General: , in no acute distress. HEENT: no pallor, no icterus, moist oral mucosa, no JVD, no lymphadenopathy Heart: Normal  s1 &s2  Regular rate and rhythm, without murmurs, rubs, gallops. Lungs: Clear to auscultation bilaterally. Abdomen: Soft, nontender, nondistended, positive bowel sounds. Extremities: No clubbing cyanosis or edema with positive pedal pulses. Neuro: Alert, awake, oriented x3, nonfocal.   Lab Results:  Basic Metabolic Panel:    Component Value Date/Time   NA 141 03/09/2011 0804   K 3.9 03/09/2011 0804   CL 107 03/09/2011 0804   CO2 27 03/09/2011 0804   BUN 10 03/09/2011 0804   CREATININE 0.77 03/09/2011 0804   GLUCOSE 98 03/09/2011 0804   CALCIUM 8.9 03/09/2011 0804   CBC:    Component Value Date/Time   WBC 6.2 03/09/2011 0804   HGB 11.9* 03/09/2011 0804   HCT 36.2 03/09/2011 0804   PLT 208 03/09/2011 0804   MCV 85.4 03/09/2011 0804   NEUTROABS 3.0 02/17/2011 0111   LYMPHSABS 2.6 02/17/2011 0111   MONOABS 0.8 02/17/2011 0111   EOSABS 0.4 02/17/2011 0111   BASOSABS 0.0 02/17/2011 0111    Recent Results (from the past 240 hour(s))  MRSA PCR SCREENING     Status: Normal   Collection Time   03/09/11  5:04 AM      Component Value Range Status Comment   MRSA by PCR NEGATIVE  NEGATIVE  Final     Studies/Results: Ct  Head Wo Contrast  03/08/2011  *RADIOLOGY REPORT*  Clinical Data: Left sided facial droop.  CT HEAD WITHOUT CONTRAST  Technique:  Contiguous axial images were obtained from the base of the skull through the vertex without contrast.  Comparison: 02/12/2011  Findings: Stable small vessel disease and old deep white matter and lacunar infarcts bilaterally as seen on the recent scan.  No evidence of acute infarction or hemorrhage.  Stable basal ganglia calcifications.  Ventricles are normal in size.  No extra-axial fluid collections.  No evidence of mass effect or mass lesion.  The skull is unremarkable.  IMPRESSION: Stable small vessel disease and old deep white matter and lacunar infarcts.  No acute findings.  Original Report Authenticated By: Reola Calkins, M.D.   Mr Brain Wo Contrast  03/10/2011  *RADIOLOGY REPORT*  Clinical Data:  CVA.  Slurred speech and left facial droop  MRI HEAD WITHOUT CONTRAST MRA HEAD WITHOUT CONTRAST  Technique:  Multiplanar, multiecho pulse sequences of the brain and surrounding structures were obtained without intravenous contrast. Angiographic images of the head were obtained using MRA technique without contrast.  Comparison:  CT 03/08/2011  MRI HEAD  Findings:  Acute infarct right external  capsule.  No other acute infarct is identified.  Chronic microvascular ischemic changes in the white matter bilaterally.  Chronic lacunar infarction in the left paracentral pons.  Chronic infarct left thalamus.  Negative for hemorrhage or mass lesion.  Ventricle size is normal. Paranasal sinuses are clear.  IMPRESSION: Acute infarct right external capsule.  Chronic ischemic changes including the white matter and left paracentral pons.  MRA HEAD  Findings: Both vertebral arteries are patent to the basilar.  PICA, basilar, and superior cerebellar arteries are patent bilaterally. Posterior cerebral arteries are patent bilaterally. Focal areas of decreased signal in the posterior cerebral artery  bilaterally compatible with atherosclerotic stenosis.  Internal carotid artery is patent bilaterally with mild irregularity in the cavernous carotid bilaterally.  Anterior and middle cerebral arteries are patent bilaterally without significant stenosis.  Negative for cerebral aneurysm.  IMPRESSION: Mild intracranial atherosclerotic disease in the cavernous carotid. Moderate stenosis in the posterior cerebral artery bilaterally.  No large vessel occlusion.  Original Report Authenticated By: Camelia Phenes, M.D.   Dg Chest Portable 1 View  03/08/2011  *RADIOLOGY REPORT*  Clinical Data: Recent myocardial infarction; stroke evaluation. History of smoking.  PORTABLE CHEST - 1 VIEW  Comparison: Chest radiograph performed 02/12/2011  Findings: The lungs are well-aerated and clear.  There is no evidence of focal opacification, pleural effusion or pneumothorax.  The cardiomediastinal silhouette is within normal limits.  No acute osseous abnormalities are seen.  IMPRESSION: No acute cardiopulmonary process seen.  Original Report Authenticated By: Tonia Ghent, M.D.   Mr Mra Head/brain Wo Cm  03/10/2011  *RADIOLOGY REPORT*  Clinical Data:  CVA.  Slurred speech and left facial droop  MRI HEAD WITHOUT CONTRAST MRA HEAD WITHOUT CONTRAST  Technique:  Multiplanar, multiecho pulse sequences of the brain and surrounding structures were obtained without intravenous contrast. Angiographic images of the head were obtained using MRA technique without contrast.  Comparison:  CT 03/08/2011  MRI HEAD  Findings:  Acute infarct right external capsule.  No other acute infarct is identified.  Chronic microvascular ischemic changes in the white matter bilaterally.  Chronic lacunar infarction in the left paracentral pons.  Chronic infarct left thalamus.  Negative for hemorrhage or mass lesion.  Ventricle size is normal. Paranasal sinuses are clear.  IMPRESSION: Acute infarct right external capsule.  Chronic ischemic changes including the  white matter and left paracentral pons.  MRA HEAD  Findings: Both vertebral arteries are patent to the basilar.  PICA, basilar, and superior cerebellar arteries are patent bilaterally. Posterior cerebral arteries are patent bilaterally. Focal areas of decreased signal in the posterior cerebral artery bilaterally compatible with atherosclerotic stenosis.  Internal carotid artery is patent bilaterally with mild irregularity in the cavernous carotid bilaterally.  Anterior and middle cerebral arteries are patent bilaterally without significant stenosis.  Negative for cerebral aneurysm.  IMPRESSION: Mild intracranial atherosclerotic disease in the cavernous carotid. Moderate stenosis in the posterior cerebral artery bilaterally.  No large vessel occlusion.  Original Report Authenticated By: Camelia Phenes, M.D.    Medications: Scheduled Meds:   . aspirin EC  81 mg Oral Daily  . citalopram  40 mg Oral QODAY  . clopidogrel  75 mg Oral Q breakfast  . enoxaparin  40 mg Subcutaneous Q24H  . metoprolol tartrate  50 mg Oral BID  . nicotine  14 mg Transdermal Daily  . simvastatin  80 mg Oral Daily  . DISCONTD: aspirin  325 mg Oral Daily   Continuous Infusions:   . 0.45 % NaCl  with KCl 20 mEq / L 75 mL/hr at 03/10/11 0729   PRN Meds:.enalaprilat, HYDROcodone-acetaminophen, hydrOXYzine, nitroGLYCERIN  Assessment  55 y/o AA female with hx of recent NSTEMI s/p cardiac cath with RCA stenosis and DES placed , HTN , depression presented with acute onset left facial droop and some left UE weakness. patient admitted for CVA at [facility] and transferred to [facility] for further workup.   PLAN:  acute CVA with rt external capsule infarct Patient has residual left facial droop and mild lt proximal UE Weakness. Speech intact  Head CT unremarkable  cotn ASA and plavix . Dose of ASA reduced Cont statin  MRI brain showing acute rt external capsule infarct with chronic ischemic changes Follow carotid doppler    patient recently ( 3 wks back) had 2D echo which was normal except for hyperdynamic LV function so would not repeat it.  PT eval  Cont tele  neurochecks  Stroke team following. appreciate neurology eval and recs   Depression  cont citalopram   Diet: cardiac   DVT prophylaxis: sq heparin        LOS: 2 days   Fuller Makin 03/10/2011, 3:03 PM

## 2011-03-10 NOTE — Progress Notes (Signed)
CSW reviewed PT note which stated patient requested CSW consult to discuss Medicaid. CSW met with niece and patient but patient slept during assessment. CSW provided family with referral to Department of Social Services for Medicaid. Family reports no further concerns. CSW is signing off but can be consulted if further needs arise. Unk Lightning [phone]

## 2011-03-10 NOTE — Progress Notes (Signed)
*  PRELIMINARY RESULTS*  Carotid Dopplers  has been performed.  No significant ICA stenosis bilaterally. Vertebral arteries are patent with antegrade flow.  Mila Homer 03/10/2011, 9:44 AM

## 2011-03-10 NOTE — Progress Notes (Signed)
Stroke Team Progress Note  SUBJECTIVE  Vanessa Proctor is a 55 y.o. female whose stroke event occurred 1 day ago. She has residual symptoms of confusion, facial droop and slurred speech. Overall she feels her condition is gradually improving. Daughter at bedside.   OBJECTIVE Most recent Vital Signs: Temp: 98.1 F (36.7 C) (11/27 0500) Temp src: Oral (11/27 0500) BP: 152/94 mmHg (11/27 0500) Pulse Rate: 60  (11/27 0500) Respiratory Rate: 18 O2 Saturdation: 96%  CBG (last 3)  No results found for this basename: GLUCAP:3 in the last 72 hours Intake/Output from previous day: 11/26 0701 - 11/27 0700 In: 1800 [P.O.:900; I.V.:900] Out: -   IV Fluid Intake:      . 0.45 % NaCl with KCl 20 mEq / L 75 mL/hr at 03/10/11 0729   Diet:  Cardiac thin liquids  Activity:  Bedrest  DVT Prophylaxis:  Lovenox 40 mg sq daily   Studies: CBC    Component Value Date/Time   WBC 6.2 03/09/2011 0804   RBC 4.24 03/09/2011 0804   HGB 11.9* 03/09/2011 0804   HCT 36.2 03/09/2011 0804   PLT 208 03/09/2011 0804   MCV 85.4 03/09/2011 0804   MCH 28.1 03/09/2011 0804   MCHC 32.9 03/09/2011 0804   RDW 13.9 03/09/2011 0804   LYMPHSABS 2.6 02/17/2011 0111   MONOABS 0.8 02/17/2011 0111   EOSABS 0.4 02/17/2011 0111   BASOSABS 0.0 02/17/2011 0111   CMP    Component Value Date/Time   NA 141 03/09/2011 0804   K 3.9 03/09/2011 0804   CL 107 03/09/2011 0804   CO2 27 03/09/2011 0804   GLUCOSE 98 03/09/2011 0804   BUN 10 03/09/2011 0804   CREATININE 0.77 03/09/2011 0804   CALCIUM 8.9 03/09/2011 0804   PROT 7.2 02/12/2011 2123   ALBUMIN 3.6 02/12/2011 2123   AST 24 02/12/2011 2123   ALT 17 02/12/2011 2123   ALKPHOS 76 02/12/2011 2123   BILITOT 0.5 02/12/2011 2123   GFRNONAA >90 03/09/2011 0804   GFRAA >90 03/09/2011 0804   COAGS Lab Results  Component Value Date   INR 0.95 02/17/2011   INR 1.04 02/13/2011   INR 1.03 02/12/2011   Lipid Panel    Component Value Date/Time   CHOL 114 03/09/2011  0804   TRIG 68 03/09/2011 0804   HDL 48 03/09/2011 0804   CHOLHDL 2.4 03/09/2011 0804   VLDL 14 03/09/2011 0804   LDLCALC 52 03/09/2011 0804   HgbA1C  Lab Results  Component Value Date   HGBA1C 6.1* 03/09/2011   Urine Drug Screen     Component Value Date/Time   LABOPIA POSITIVE* 02/12/2011 2231   COCAINSCRNUR NONE DETECTED 02/12/2011 2231   LABBENZ NONE DETECTED 02/12/2011 2231   AMPHETMU NONE DETECTED 02/12/2011 2231   THCU POSITIVE* 02/12/2011 2231   LABBARB NONE DETECTED 02/12/2011 2231    Alcohol Level No results found for this basename: eth   CT of the brain:   Stable small vessel disease and old deep white matter and lacunar infarcts.  No acute findings.   MRI of the brain:  Acute infarct right external capsule. Chronic ischemic changes including the white matter and left paracentral pons.  MRA of the brain: Mild intracranial atherosclerotic disease in the cavernous carotid. Moderate stenosis in the posterior cerebral artery bilaterally. No large vessel occlusion.  2D Echocardiogram:  From 02/13/11 EF 65-70% with no source of embolus.   Carotid Doppler:  No right or left internal carotid artery stenosis. Vertebrals with  antegrade flow bilaterally.  CXR:  No acute cardiopulmonary process seen.   EKG:  sinus bradycardia, ischemic changes noted since 02/13/11 in inferior ischemia,  ST elevation in, T wave abnormality   Physical Exam:   Pleasant elderly lady not in distress. Afebrile. Neck is supple without bruit. Hearing is normal. Cardiac exam no murmur or gallop. Lungs are clear to auscultation.  Neurological exam awake alert oriented to time place and person. Speech and language appear normal. Minimal dysarthria. Mild left lower facial weakness. Tongue is midline. Eye movements are full range without nystagmus. Visual fields are full to confrontational testing.  Motor system exam no upper extremity drift. Mild weakness of left grip and intrinsic hand muscles. Orbits the  right left upper extremity. Good lower extremity strength. 4+ out of 5 weakness of the left ankle dorsiflexors. Mild impaired finger-to-nose coordination on the left. No sensory loss. Gait was not tested. ASSESSMENT Ms. Vanessa Proctor is a 55 y.o. female with a right external capsule infarct, secondary to small vessel disease , on aspirin 325 mg orally every day and clopidogrel 75 mg orally every day prior to admission for coronary artery disease status post cardiac stenting. Stroke workup completed. Needs HH PT.  Stroke risk factors:  hypertension and obesity and CAD  Hospital day # 2  TREATMENT/PLAN Continue clopidogrel 75 mg orally every day for stroke prevention. Decrease ASA to 81 mg. Home health PT/OT. Follow up Dr. Pearlean Brownie 2 mo.  Joaquin Music, ANP-BC, GNP-BC [facility] Pager: [phone] 03/10/2011 11:34 AM  Dr. Delia Heady, Stroke Center Medical Director, has personally reviewed chart, pertinent data, examined the patient and developed the plan of care.

## 2011-03-11 LAB — CBC
HCT: 36.9 % (ref 36.0–46.0)
Hemoglobin: 11.9 g/dL — ABNORMAL LOW (ref 12.0–15.0)
MCH: 27.8 pg (ref 26.0–34.0)
MCHC: 32.2 g/dL (ref 30.0–36.0)
MCV: 86.2 fL (ref 78.0–100.0)
Platelets: 196 10*3/uL (ref 150–400)
RBC: 4.28 MIL/uL (ref 3.87–5.11)
RDW: 14 % (ref 11.5–15.5)
WBC: 5.2 10*3/uL (ref 4.0–10.5)

## 2011-03-11 LAB — GLUCOSE, CAPILLARY
Glucose-Capillary: 115 mg/dL — ABNORMAL HIGH (ref 70–99)
Glucose-Capillary: 103 mg/dL — ABNORMAL HIGH (ref 70–99)
Glucose-Capillary: 105 mg/dL — ABNORMAL HIGH (ref 70–99)
Glucose-Capillary: 104 mg/dL — ABNORMAL HIGH (ref 70–99)

## 2011-03-11 MED ORDER — NICOTINE 14 MG/24HR TD PT24: 1.0000 | MEDICATED_PATCH | Freq: Every day | TRANSDERMAL | Status: AC

## 2011-03-11 MED ORDER — CLOPIDOGREL BISULFATE 75 MG PO TABS: 75.0000 mg | ORAL_TABLET | Freq: Every day | ORAL | Status: DC

## 2011-03-11 MED ORDER — SIMVASTATIN 80 MG PO TABS: 80.0000 mg | ORAL_TABLET | Freq: Every day | ORAL | Status: DC

## 2011-03-11 MED ORDER — ASPIRIN 81 MG PO CHEW
CHEWABLE_TABLET | ORAL | Status: AC
  Filled 2011-03-11: qty 1

## 2011-03-11 NOTE — Consult Note (Signed)
Pt smokes 1/3 ppd but plans to quit on the patch. A Rx for a 14 mg patch has been written for the pt. Discussed and explained patch use instructions to the pt. 14 mg x 2 weeks and 7 mg x 2 weeks. Explored behavioral changes to help the pt with quitting. Referred to 1-800 quit now for f/u and support. Discussed oral fixation substitutes, second hand smoke and in home smoking policy. Reviewed and gave pt Written education/contact information.

## 2011-03-11 NOTE — Progress Notes (Signed)
PT. DISCHARGED HOME
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
## 2011-03-11 NOTE — Progress Notes (Signed)
Physical Therapy Treatment Patient Details Name: Vanessa Proctor MRN: [id number] DOB: 1956/02/16 Today's Date: 03/11/2011  PT Assessment/Plan  PT - Assessment/Plan Comments on Treatment Session: Pt tolerated treatment well, is still slightly unsteady with ambulation.  Instructed her to ambulate only when someone is in the house with her.  Follow up HHPT is a definite need.  According to her daughter, pt's mobility worsens in the evening.Marland Kitchen PT Plan: Discharge plan remains appropriate;Frequency remains appropriate PT Frequency: Min 4X/week Follow Up Recommendations: Home health PT;Other (comment) Equipment Recommended: Cane PT Goals  Acute Rehab PT Goals PT Goal Formulation: With patient Time For Goal Achievement: 2 weeks Pt will go Supine/Side to Sit: Independently;with HOB not 0 degrees (comment degree) PT Goal: Supine/Side to Sit - Progress: Met Pt will go Sit to Supine/Side: Independently;with HOB not 0 degrees (comment degree) PT Goal: Sit to Supine/Side - Progress: Met Pt will Transfer Sit to Stand/Stand to Sit: with modified independence;with upper extremity assist PT Transfer Goal: Sit to Stand/Stand to Sit - Progress: Met Pt will Transfer Bed to Chair/Chair to Bed: with modified independence PT Transfer Goal: Bed to Chair/Chair to Bed - Progress: Other (comment) (NT) Pt will Ambulate: >150 feet;with modified independence;with cane;with gait velocity >(comment) ft/second PT Goal: Ambulate - Progress: Progressing toward goal Pt will Go Up / Down Stairs: 3-5 stairs;with rail(s);with modified independence PT Goal: Up/Down Stairs - Progress: Progressing toward goal PT Goal: Perform Home Exercise Program - Progress: Discontinued (comment) (f/u with HHPT)  PT Treatment Precautions/Restrictions  Precautions Required Braces or Orthoses: No Restrictions Weight Bearing Restrictions: No Mobility (including Balance) Bed Mobility Bed Mobility: Yes Supine to Sit: 7: Independent;HOB  flat Sitting - Scoot to Edge of Bed: 7: Independent Sit to Supine - Left: 7: Independent;HOB flat Transfers Transfers: Yes Sit to Stand: 6: Modified independent (Device/Increase time);With upper extremity assist;From bed;From toilet Sit to Stand Details (indicate cue type and reason): vc's to wait until steady to ambulate Stand to Sit: 6: Modified independent (Device/Increase time);With upper extremity assist;To bed;To toilet Stand to Sit Details: sits safely Ambulation/Gait Ambulation/Gait: Yes Ambulation/Gait Assistance: 4: Min assist;Other (comment) (min-guard A) Ambulation/Gait Assistance Details (indicate cue type and reason): pt used quad cane (was set to correct height) on right side at first but left knee was occasionally buckling slightly so switched cane to left hand which freed up her right hand for activity (pt is right hand dominant) and gave increased support for the left LE.  Pt preferred this arrangement and cane was swiveled appropriately. Ambulation Distance (Feet): 400 Feet Assistive device: Large base quad cane Gait Pattern: Decreased weight shift to left (occasional left knee buckling, pt able to correct I'ly) Gait velocity: decreased Stairs: Yes Stairs Assistance: 5: Supervision Stairs Assistance Details (indicate cue type and reason): vc's for simultaneous use of cane and rail.  Pt able to ascend and descend with either LE Stair Management Technique: With cane;One rail Left;Alternating pattern;Forwards Number of Stairs: 5  Height of Stairs: 6  Wheelchair Mobility Wheelchair Mobility: No  Posture/Postural Control Posture/Postural Control: No significant limitations Balance Balance Assessed: Yes Dynamic Standing Balance Dynamic Standing - Level of Assistance: 5: Stand by assistance (decreased stability with left lean) Exercise    End of Session PT - End of Session Equipment Utilized During Treatment: Gait belt Activity Tolerance: Patient tolerated treatment  well Patient left: in bed;with family/visitor present;with call bell in reach Nurse Communication:  (discussed d/c) General Behavior During Session: [facility] for tasks performed Cognition: [facility] for tasks performed  Dorrine Montone, [place]  [phone] 03/11/2011, 4:53 PM

## 2011-03-11 NOTE — Progress Notes (Signed)
HOME HEALTH AGENCIES SERVING [place]   Agencies that are Medicare-Certified and are affiliated with [facility]  Telephone Number Address  [facility]   [facility] has ownership interest in this company; however, you are under no obligation to use this agency. [phone] or  [phone] [address] [place], [place] [address]   Agencies that are Medicare-Certified and are not affiliated with [facility] Telephone Number Address  [facility] [phone] Fax [phone] [address], [address] [place], [place]  [address]  [facility] [phone] or [phone] Fax [phone] [address] [place], [place] [address]  [facility] [phone] Fax [phone] [address] [place], [place] [address]  [facility] [phone] Fax [phone] [address] [place], [place]  [address]  [facility] [phone] Fax [phone] [address], Suite [place], [place] [address]  [facility] [phone] [address] [place], [place] [address]  [facility] [phone]  [address] [place], [place] [address]  [facility] [phone] or [phone] Fax [phone] [address] [place], [place]  [address]  [facility] [phone] Fax [phone] [address] [place], [place]  [address]  [facility]  [phone] Fax [phone] [address] [place], [place] [address]     CARE MANAGEMENT NOTE 03/11/2011  Patient:  Vanessa Proctor, Vanessa Proctor   Account Number:  [id number]  Date Initiated:  03/09/2011  Documentation initiated by:   Junius Creamer  Subjective/Objective Assessment:   adm w cva     Action/Plan:   lives w family, to be seen by dr Philipp Deputy at [facility] for pcp, her pa has recently left, appt w dr Philipp Deputy in january, appt for recert of orange card on 12-28   Anticipated DC Date:  03/11/2011   Anticipated DC Plan:  HOME W HOME HEALTH SERVICES      DC Planning Services  CM consult  Follow-up appt scheduled  GCCN / P4HM (established/new)  [facility]      [facility] Choice  DURABLE MEDICAL EQUIPMENT   Choice offered to / List presented to:  C-4 Adult Children   DME arranged  CANE      DME agency  [facility]     HH arranged  HH-2 PT  HH-1 RN      [facility] agency  [facility]   Status of service:  Completed, signed off Medicare Important Message given?   (If response is "NO", the following Medicare IM given date fields will be blank) Date Medicare IM given:   Date Additional Medicare IM given:    Discharge Disposition:  HOME W HOME HEALTH SERVICES  Comments:  03/11/11 1050 Nadina Fomby RN MSN CCM Discussed HH PT, RN with daughter and pt.  Pt now agrees,  list of agencies provided to dtr, referral made per choice, list of agencies in chart. Per dtr, application for disability and MCD have been completed.  11/27 verified [place] appt w [facility] on 12-28 at 11:15a, appt w dr Philipp Deputy on 1-8 at 8:30am, debbie dowell rn,bsn 11/27 spoke w pt and da, pt's orange card has expired, she has appt for recert but agreeable for dorothy timmons w [facility] to see her to see if she can requal sooner, also left vm for [employer] to see pt reg hosp bill and disability, pt's pa ms martin w [facility] has left now pt sched to see dr vollmer in [place], she would like cane but does not want hhc at this time, left my card w pt, ordered cane from [facility] for del to room, debbie dowell rn,bsn [phone] 11/26 await results of tests then pt rec for dc planning, debbie  dowell rn,bsn [phone]

## 2011-05-19 ENCOUNTER — Encounter (HOSPITAL_COMMUNITY): Payer: Self-pay

## 2011-05-19 ENCOUNTER — Emergency Department (HOSPITAL_COMMUNITY)
Admission: EM | Admit: 2011-05-19 | Discharge: 2011-05-19 | Disposition: A | Discharge status: Home / Self Care | Payer: Self-pay | Source: Home / Self Care | Attending: Family Medicine | Admitting: Family Medicine

## 2011-05-19 DIAGNOSIS — N390 Urinary tract infection, site not specified: Secondary | ICD-10-CM

## 2011-05-19 DIAGNOSIS — M545 Low back pain, unspecified: Secondary | ICD-10-CM

## 2011-05-19 HISTORY — DX: Unspecified osteoarthritis, unspecified site: M19.90

## 2011-05-19 HISTORY — DX: Atherosclerotic heart disease of native coronary artery without angina pectoris: I25.10

## 2011-05-19 HISTORY — DX: Cerebral infarction, unspecified: I63.9

## 2011-05-19 LAB — POCT URINALYSIS DIP (DEVICE)
Bilirubin Urine: NEGATIVE
Glucose, UA: NEGATIVE mg/dL
Hgb urine dipstick: NEGATIVE
Ketones, ur: NEGATIVE mg/dL
Nitrite: POSITIVE — AB
Protein, ur: NEGATIVE mg/dL
Specific Gravity, Urine: 1.015 (ref 1.005–1.030)
Urobilinogen, UA: >=8 mg/dL (ref 0.0–1.0)
pH: 7 (ref 5.0–8.0)

## 2011-05-19 MED ORDER — CEPHALEXIN 500 MG PO CAPS: 500.0000 mg | ORAL_CAPSULE | Freq: Four times a day (QID) | ORAL | Status: AC

## 2011-05-19 MED ORDER — HYDROCODONE-ACETAMINOPHEN 5-325 MG PO TABS: 1.0000 | ORAL_TABLET | Freq: Four times a day (QID) | ORAL | Status: AC | PRN

## 2011-05-19 NOTE — ED Notes (Signed)
Pt c/o low back pain and fever that started last week.  States no fever since Sunday.  Reports she has ongoing problems with rt leg cramps and the last 2 nights she has been unable to sleep due to leg cramps and back pain.  Denies urinary sx.

## 2011-05-19 NOTE — ED Provider Notes (Signed)
History     CSN: [id number]  Arrival date & time 05/19/11  1215   First MD Initiated Contact with Patient 05/19/11 1537      Chief Complaint  Patient presents with  . Back Pain  . Leg Pain    (Consider location/radiation/quality/duration/timing/severity/associated sxs/prior treatment) Patient is a 56 y.o. female presenting with back pain and leg pain. The history is provided by the patient.  Back Pain  This is a recurrent problem. The current episode started more than 1 week ago. The problem occurs constantly. The problem has been gradually worsening. Associated with: known arthritis in back worse after prolonged sitting. The pain is present in the lumbar spine. The quality of the pain is described as shooting. The pain does not radiate. The pain is mild. The symptoms are aggravated by bending. Associated symptoms include numbness and leg pain. Pertinent negatives include no abdominal pain and no bowel incontinence. Associated symptoms comments: Chronic paresthesias from cva.. Risk factors include a sedentary lifestyle.  Leg Pain  Associated symptoms include numbness.    Past Medical History  Diagnosis Date  . Hypertension   . Depression   . Myocardial infarction     november 1st 2012  . Stroke   . Arthritis   . Coronary artery disease     Past Surgical History  Procedure Date  . Abdominal hysterectomy   . Tonsillectomy   . Coronary angioplasty with stent placement     Family History  Problem Relation Age of Onset  . Coronary artery disease    . Cancer      History  Substance Use Topics  . Smoking status: Current Everyday Smoker  . Smokeless tobacco: Not on file  . Alcohol Use: No    OB History    Grav Para Term Preterm Abortions TAB SAB Ect Mult Living                  Review of Systems  Constitutional: Negative.   Gastrointestinal: Negative.  Negative for abdominal pain and bowel incontinence.  Musculoskeletal: Positive for back pain. Negative for gait  problem.  Neurological: Positive for numbness.    Allergies  Bactrim and Sulfa antibiotics  Home Medications   Current Outpatient Rx  Name Route Sig Dispense Refill  . ASPIRIN 81 MG PO CHEW Oral Chew 81 mg by mouth daily.      . CEPHALEXIN 500 MG PO CAPS Oral Take 1 capsule (500 mg total) by mouth 4 (four) times daily. Take all of medicine and drink lots of fluids 20 capsule 0  . CITALOPRAM HYDROBROMIDE 40 MG PO TABS Oral Take 40 mg by mouth every other day.      . CLOPIDOGREL BISULFATE 75 MG PO TABS Oral Take 1 tablet (75 mg total) by mouth daily with breakfast. 30 tablet 0  . HYDROCHLOROTHIAZIDE 12.5 MG PO CAPS Oral Take 12.5 mg by mouth daily.      Marland Kitchen HYDROCODONE-ACETAMINOPHEN 10-325 MG PO TABS Oral Take 1 tablet by mouth every 6 (six) hours as needed. For pain     . HYDROCODONE-ACETAMINOPHEN 5-325 MG PO TABS Oral Take 1 tablet by mouth every 6 (six) hours as needed for pain. 20 tablet 0  . HYDROXYZINE HCL 25 MG PO TABS Oral Take 25 mg by mouth 2 (two) times daily as needed. For itching     . METOPROLOL SUCCINATE ER 50 MG PO TB24 Oral Take 50 mg by mouth daily.      Marland Kitchen NITROGLYCERIN 0.4 MG SL  SUBL Sublingual Place 0.4 mg under the tongue every 5 (five) minutes as needed. For chest pain     . SIMVASTATIN 80 MG PO TABS Oral Take 1 tablet (80 mg total) by mouth daily. 30 tablet 1    BP 143/77  Pulse 74  Temp(Src) 98.5 F (36.9 C) (Oral)  Resp 17  SpO2 100%  Physical Exam  Nursing note and vitals reviewed. Constitutional: She is oriented to person, place, and time. She appears well-developed and well-nourished.  Abdominal: Soft. Bowel sounds are normal. There is no tenderness. There is no rebound and no guarding.  Musculoskeletal: She exhibits tenderness.       Ambulatory without assistance.  Neurological: She is alert and oriented to person, place, and time.  Skin: Skin is warm and dry.  Psychiatric: She has a normal mood and affect.    ED Course  Procedures (including  critical care time)  Labs Reviewed  POCT URINALYSIS DIP (DEVICE) - Abnormal; Notable for the following:    Nitrite POSITIVE (*)    Leukocytes, UA MODERATE (*) Biochemical Testing Only. Please order routine urinalysis from main lab if confirmatory testing is needed.   All other components within normal limits  LAB REPORT - SCANNED   No results found.   1. Low back pain   2. UTI (lower urinary tract infection)       MDM          Barkley Bruns, MD 05/22/11 1045

## 2011-06-15 ENCOUNTER — Other Ambulatory Visit: Payer: Self-pay | Admitting: Internal Medicine

## 2011-06-15 DIAGNOSIS — Z1231 Encounter for screening mammogram for malignant neoplasm of breast: Secondary | ICD-10-CM

## 2011-07-23 ENCOUNTER — Ambulatory Visit (HOSPITAL_COMMUNITY)
Admission: RE | Admit: 2011-07-23 | Discharge: 2011-07-23 | Disposition: A | Discharge status: Home / Self Care | Payer: Self-pay | Source: Ambulatory Visit | Attending: Internal Medicine | Admitting: Internal Medicine

## 2011-07-23 DIAGNOSIS — Z1231 Encounter for screening mammogram for malignant neoplasm of breast: Secondary | ICD-10-CM | POA: Insufficient documentation

## 2011-12-01 ENCOUNTER — Encounter: Payer: Self-pay | Admitting: Cardiovascular Disease

## 2011-12-01 ENCOUNTER — Encounter: Payer: Self-pay | Admitting: *Deleted

## 2011-12-02 ENCOUNTER — Ambulatory Visit: Payer: Medicaid Other | Admitting: Cardiovascular Disease

## 2011-12-03 ENCOUNTER — Encounter: Payer: Medicaid Other | Admitting: Cardiology

## 2011-12-22 ENCOUNTER — Encounter: Payer: Self-pay | Admitting: Cardiology

## 2011-12-22 ENCOUNTER — Ambulatory Visit (INDEPENDENT_AMBULATORY_CARE_PROVIDER_SITE_OTHER): Payer: Medicaid Other | Admitting: Cardiology

## 2011-12-22 VITALS — BP 118/60 | HR 89 | Ht 61.0 in | Wt 189.0 lb

## 2011-12-22 DIAGNOSIS — F17200 Nicotine dependence, unspecified, uncomplicated: Secondary | ICD-10-CM

## 2011-12-22 DIAGNOSIS — I2510 Atherosclerotic heart disease of native coronary artery without angina pectoris: Secondary | ICD-10-CM

## 2011-12-22 DIAGNOSIS — I739 Peripheral vascular disease, unspecified: Secondary | ICD-10-CM

## 2011-12-22 DIAGNOSIS — R0609 Other forms of dyspnea: Secondary | ICD-10-CM

## 2011-12-22 DIAGNOSIS — E7800 Pure hypercholesterolemia, unspecified: Secondary | ICD-10-CM

## 2011-12-22 DIAGNOSIS — R0600 Dyspnea, unspecified: Secondary | ICD-10-CM

## 2011-12-22 DIAGNOSIS — E785 Hyperlipidemia, unspecified: Secondary | ICD-10-CM

## 2011-12-22 MED ORDER — ATORVASTATIN CALCIUM 80 MG PO TABS: 80.0000 mg | ORAL_TABLET | Freq: Every day | ORAL | Status: DC

## 2011-12-22 NOTE — Patient Instructions (Addendum)
Stop Zocor (simvastatin).  Start atorvastatin 80mg  daily.   You have been referred to Cardiac Rehab at [facility].  You have been referred to [facility] at [facility] to establish with a doctor for your primary care.   Your physician has requested that you have a lower  extremity arterial duplex. This test is an ultrasound of the arteries in the legs. It looks at arterial blood flow in the legs . Allow one hour for Lower  Arterial scans. There are no restrictions or special instructions  Your physician recommends that you return for a FASTING lipid profile /liver profile /BMET/BNP. In 2 months.   Your physician recommends that you schedule a follow-up appointment in: December 2013 with Dr Shirlee Latch.

## 2011-12-23 DIAGNOSIS — R0600 Dyspnea, unspecified: Secondary | ICD-10-CM | POA: Insufficient documentation

## 2011-12-23 NOTE — Progress Notes (Signed)
Patient ID: Vanessa Proctor, female   DOB: 07/09/1955, 56 y.o.   MRN: [id number]  56 yo with history of CAD s/p NSTEMI and CVA presents for cardiology followup.  Vanessa Proctor had NSTEMI with PCI to RCA with DES in 11/12.  A few weeks after the NSTEMI, she had a CVA.  EF was preserved on echo.  Since that time, she has been symptomatically stable.  She has occasional episodes of chest tightness associated with emotional stress.   Last episode was about 2 weeks ago and lasted about 20 minutes.  These episodes are infrequent.  She has not noted exertional chest pain.  She can walk on flat ground without dyspnea.  She is short of breath walking up steps.  She says she has gained over 30 lbs since her MI.  She also reports crampy pain in her calves when she walks up steps.  She denies any residual disability from her CVA.  She is still smoking.   ECG: NSR, LAE, slightly prolonged QTc  Labs (11/12): K 3.9, creatinine 0.77  PMH: 1. HTN 2. Depression 3. CAD: NSTEMI in 11/12.  LHC with EF 55-60%, basal inferior HK, 99% mRCA stenosis, 40% OM1.  Patient had Promus DES to RCA.   4. CVA: 11/12, right external capsule.  Thought to be due to small vessel disease.  Carotid dopplers showed no significant stenosis.  5. Echo (11/12): EF 65-70%, no significant valvular disease.   SH: Unemployed, has 2 daughters, lives with one of her daughters.  Enrolled at [facility] [employer] currently.  Active smoker.  FH: Father with CHF.  ROS: All systems reviewed and negative except as per HPI.   Current Outpatient Prescriptions  Medication Sig Dispense Refill  . aspirin 81 MG chewable tablet Chew 81 mg by mouth daily.        . clopidogrel (PLAVIX) 75 MG tablet Take 1 tablet (75 mg total) by mouth daily with breakfast.  30 tablet  0  . hydrochlorothiazide (MICROZIDE) 12.5 MG capsule Take 12.5 mg by mouth daily.        . hydrOXYzine (ATARAX/VISTARIL) 25 MG tablet Take 25 mg by mouth 3 (three) times daily. For itching      .  metoprolol tartrate (LOPRESSOR) 25 MG tablet Take 25 mg by mouth 2 (two) times daily.      . nitroGLYCERIN (NITROSTAT) 0.4 MG SL tablet Place 0.4 mg under the tongue every 5 (five) minutes as needed. For chest pain       . atorvastatin (LIPITOR) 80 MG tablet Take 1 tablet (80 mg total) by mouth daily.  30 tablet  3    BP 118/60  Pulse 89  Ht 5\' 1"  (1.549 m)  Wt 189 lb (85.73 kg)  BMI 35.71 kg/m2 General: NAD Neck: No JVD, no thyromegaly or thyroid nodule.  Lungs: Clear to auscultation bilaterally with normal respiratory effort. CV: Nondisplaced PMI.  Heart regular S1/S2, no S3/S4, no murmur.  Trace ankle edema.  No carotid bruit.  1+ left DP pulse, hard to feel other pedal pulses.  Feet are warm.   Abdomen: Soft, nontender, no hepatosplenomegaly, no distention.  Neurologic: Alert and oriented x 3.  Psych: Normal affect. Extremities: No clubbing or cyanosis.    Assessment/Plan 1. CAD: Occasional atypical chest pain.  I will have her continue Plavix at least until 11/12.  She will continue ASA 81, metoprolol, and statin.  She is not particularly active and never did cardiac rehab.  I am going to refer her to  cardiac rehab as she is interested in the program.  2. Hyperlipidemia: She is on simvastatin 80 mg daily.  I will change her to atorvastatin 80 mg daily.  She will get lipids/LFTs in 2 months.   3. HTN: BP is well-controlled with HCTZ and metoprolol.  4. Claudication: Patient's pedal pulses are difficult to palpate and she has calf cramping when she walks up steps.  I will get lower extremity arterial dopplers to assess for PAD.  5. Dyspnea: Some DOE.  She does not appear volume overloaded on exam.  She has gained a lot of weight since her MI.  I think this may be more related to deconditioning and weight gain.  As above, she will start cardiac rehab.  I will get a BNP.  6. Smoking: I strongly advised her to quit.   Jovonne Wilton [employer]

## 2011-12-29 ENCOUNTER — Encounter (INDEPENDENT_AMBULATORY_CARE_PROVIDER_SITE_OTHER): Payer: Medicaid Other

## 2011-12-29 DIAGNOSIS — I739 Peripheral vascular disease, unspecified: Secondary | ICD-10-CM

## 2011-12-29 DIAGNOSIS — I70219 Atherosclerosis of native arteries of extremities with intermittent claudication, unspecified extremity: Secondary | ICD-10-CM

## 2012-02-05 ENCOUNTER — Other Ambulatory Visit (INDEPENDENT_AMBULATORY_CARE_PROVIDER_SITE_OTHER): Payer: Medicaid Other

## 2012-02-05 DIAGNOSIS — I2510 Atherosclerotic heart disease of native coronary artery without angina pectoris: Secondary | ICD-10-CM

## 2012-02-05 DIAGNOSIS — E7800 Pure hypercholesterolemia, unspecified: Secondary | ICD-10-CM

## 2012-02-05 LAB — LIPID PANEL
Cholesterol: 117 mg/dL (ref 0–200)
HDL: 35.5 mg/dL — ABNORMAL LOW (ref >39.00)
LDL Cholesterol: 62 mg/dL (ref 0–99)
Total CHOL/HDL Ratio: 3
Triglycerides: 99 mg/dL (ref 0.0–149.0)
VLDL: 19.8 mg/dL (ref 0.0–40.0)

## 2012-02-05 LAB — BRAIN NATRIURETIC PEPTIDE: Pro B Natriuretic peptide (BNP): 5 pg/mL (ref 0.0–100.0)

## 2012-02-05 LAB — HEPATIC FUNCTION PANEL
ALT: 43 U/L — ABNORMAL HIGH (ref 0–35)
AST: 47 U/L — ABNORMAL HIGH (ref 0–37)
Albumin: 3.3 g/dL — ABNORMAL LOW (ref 3.5–5.2)
Alkaline Phosphatase: 63 U/L (ref 39–117)
Bilirubin, Direct: 0.1 mg/dL (ref 0.0–0.3)
Total Bilirubin: 0.8 mg/dL (ref 0.3–1.2)
Total Protein: 7.5 g/dL (ref 6.0–8.3)

## 2012-02-05 LAB — BASIC METABOLIC PANEL
BUN: 16 mg/dL (ref 6–23)
CO2: 30 mEq/L (ref 19–32)
Calcium: 10 mg/dL (ref 8.4–10.5)
Chloride: 103 mEq/L (ref 96–112)
Creatinine, Ser: 1 mg/dL (ref 0.4–1.2)
GFR: 77.51 mL/min (ref >60.00)
Glucose, Bld: 97 mg/dL (ref 70–99)
Potassium: 4.4 mEq/L (ref 3.5–5.1)
Sodium: 139 mEq/L (ref 135–145)

## 2012-02-09 ENCOUNTER — Other Ambulatory Visit: Payer: Self-pay | Admitting: *Deleted

## 2012-02-09 DIAGNOSIS — R748 Abnormal levels of other serum enzymes: Secondary | ICD-10-CM

## 2012-02-15 ENCOUNTER — Telehealth: Payer: Self-pay | Admitting: Cardiology

## 2012-02-15 NOTE — Telephone Encounter (Signed)
Spoke with pt about time of lab appt 03/11/12.

## 2012-02-15 NOTE — Telephone Encounter (Signed)
New Problem:    Patient called in claiming that she received a call instructing her to ask for Vanessa Proctor.  Please call back.

## 2012-02-16 ENCOUNTER — Other Ambulatory Visit: Payer: Self-pay | Admitting: Cardiovascular Disease

## 2012-02-22 ENCOUNTER — Other Ambulatory Visit: Payer: Medicaid Other

## 2012-03-11 ENCOUNTER — Other Ambulatory Visit (INDEPENDENT_AMBULATORY_CARE_PROVIDER_SITE_OTHER): Payer: Self-pay

## 2012-03-11 DIAGNOSIS — R748 Abnormal levels of other serum enzymes: Secondary | ICD-10-CM

## 2012-03-11 LAB — HEPATIC FUNCTION PANEL
ALT: 40 U/L — ABNORMAL HIGH (ref 0–35)
AST: 48 U/L — ABNORMAL HIGH (ref 0–37)
Albumin: 3.6 g/dL (ref 3.5–5.2)
Alkaline Phosphatase: 74 U/L (ref 39–117)
Bilirubin, Direct: 0 mg/dL (ref 0.0–0.3)
Total Bilirubin: 0.6 mg/dL (ref 0.3–1.2)
Total Protein: 7.7 g/dL (ref 6.0–8.3)

## 2012-03-28 ENCOUNTER — Ambulatory Visit (INDEPENDENT_AMBULATORY_CARE_PROVIDER_SITE_OTHER): Payer: Self-pay | Admitting: Cardiology

## 2012-03-28 ENCOUNTER — Encounter: Payer: Self-pay | Admitting: Cardiology

## 2012-03-28 VITALS — BP 134/76 | HR 63 | Ht 61.0 in | Wt 185.0 lb

## 2012-03-28 DIAGNOSIS — E785 Hyperlipidemia, unspecified: Secondary | ICD-10-CM

## 2012-03-28 DIAGNOSIS — I6350 Cerebral infarction due to unspecified occlusion or stenosis of unspecified cerebral artery: Secondary | ICD-10-CM

## 2012-03-28 DIAGNOSIS — I639 Cerebral infarction, unspecified: Secondary | ICD-10-CM

## 2012-03-28 DIAGNOSIS — F17200 Nicotine dependence, unspecified, uncomplicated: Secondary | ICD-10-CM

## 2012-03-28 DIAGNOSIS — I2510 Atherosclerotic heart disease of native coronary artery without angina pectoris: Secondary | ICD-10-CM

## 2012-03-28 NOTE — Patient Instructions (Addendum)
Use a nicotine patch 14mg  /24hours to help you stop smoking. You do not need a prescription for this.   Your physician recommends that you return for a FASTING lipid profile /liver profile in April 2014.   Your physician wants you to follow-up in: 6 months with Dr Shirlee Latch. (June 2014).  You will receive a reminder letter in the mail two months in advance. If you don't receive a letter, please call our office to schedule the follow-up appointment.

## 2012-03-28 NOTE — Progress Notes (Signed)
Patient ID: Vanessa Proctor, female   DOB: 02-Aug-1955, 56 y.o.   MRN: [id number]  56 yo with history of CAD s/p NSTEMI and CVA presents for cardiology followup.  Vanessa Proctor had NSTEMI with PCI to RCA with DES in 11/12.  A few weeks after the NSTEMI, she had a CVA.  EF was preserved on echo.  Since that time, she has been symptomatically stable.  No chest pain.  She is chronically short of breath after walking about 1 block or walking up steps.  She has lost 4 lbs (gained considerable weight after MI).  She is still smoking several cigarettes/day. She is now off Plavix. Normal ABIs in 9/13.   Labs (11/12): K 3.9, creatinine 0.77 Labs (10/13): LDL 62, HDL 36, AST 47, ALT 43 Labs (11/13): AST 48, ALT 40  PMH: 1. HTN 2. Depression 3. CAD: NSTEMI in 11/12.  LHC with EF 55-60%, basal inferior HK, 99% mRCA stenosis, 40% OM1.  Patient had Promus DES to RCA.   4. CVA: 11/12, right external capsule.  Thought to be due to small vessel disease.  Carotid dopplers showed no significant stenosis.  5. Echo (11/12): EF 65-70%, no significant valvular disease.  6. Elevated transaminases: Mild, with statin use.  7. ABIs (9/13): Normal.  8. Active smoker.   SH: Unemployed, has 2 daughters, lives with one of her daughters.  Enrolled at [employer] [employer] [facility] [employer] [facility] currently.  Active smoker.   FH: Father with CHF.  ROS: All systems reviewed and negative except as per HPI.   Current Outpatient Prescriptions  Medication Sig Dispense Refill  . aspirin 81 MG chewable tablet Chew 81 mg by mouth daily.        Marland Kitchen atorvastatin (LIPITOR) 80 MG tablet Take 1 tablet (80 mg total) by mouth daily.  30 tablet  3  . hydrochlorothiazide (MICROZIDE) 12.5 MG capsule Take 12.5 mg by mouth daily.        . hydrOXYzine (ATARAX/VISTARIL) 25 MG tablet Take 25 mg by mouth 3 (three) times daily. For anxiety      . metoprolol tartrate (LOPRESSOR) 25 MG tablet TAKE ONE TABLET BY MOUTH TWICE DAILY  60 tablet  3  . nitroGLYCERIN (NITROSTAT) 0.4 MG SL  tablet Place 0.4 mg under the tongue every 5 (five) minutes as needed. For chest pain       . nicotine (NICODERM CQ - DOSED IN MG/24 HOURS) 14 mg/24hr patch Place 1 patch onto the skin daily.        BP 134/76  Pulse 63  Ht 5\' 1"  (1.549 m)  Wt 185 lb (83.915 kg)  BMI 34.96 kg/m2  SpO2 98% General: NAD Neck: No JVD, no thyromegaly or thyroid nodule.  Lungs: Clear to auscultation bilaterally with normal respiratory effort. CV: Nondisplaced PMI.  Heart regular S1/S2, no S3/S4, no murmur.  Trace ankle edema.  No carotid bruit.  1+ left DP pulse, hard to feel other pedal pulses.  Feet are warm.   Abdomen: Soft, nontender, no hepatosplenomegaly, no distention.  Neurologic: Alert and oriented x 3.  Psych: Normal affect. Extremities: No clubbing or cyanosis.    Assessment/Plan 1. CAD: Rare atypical chest pain. She will continue ASA 81, metoprolol, and statin.  She is not particularly active and never did cardiac rehab. She wants to do cardiac rehab now, will refer her again.  2. Hyperlipidemia: Good lipids on atorvastatin 80.  LFTs mildly elevated and stable, will need follow carefully.  As long as elevation is mild, will not change statin  dose. Check lipids/LFTs in 4/13.  3. HTN: BP is well-controlled with HCTZ and metoprolol.  4. Dyspnea: Some DOE.  She does not appear volume overloaded on exam.  She has gained a lot of weight since her MI.  I think this may be more related to deconditioning and weight gain.  As above, she will start cardiac rehab.  5. Smoking: I strongly advised her to quit. She will try nicotine patches.   Vanessa Proctor 03/28/2012 5:07 PM

## 2012-06-10 ENCOUNTER — Other Ambulatory Visit: Payer: Self-pay | Admitting: Cardiovascular Disease

## 2012-06-29 ENCOUNTER — Other Ambulatory Visit: Payer: Self-pay | Admitting: Cardiology

## 2012-06-29 DIAGNOSIS — Z1231 Encounter for screening mammogram for malignant neoplasm of breast: Secondary | ICD-10-CM

## 2012-07-25 ENCOUNTER — Other Ambulatory Visit: Payer: Self-pay

## 2012-08-01 ENCOUNTER — Ambulatory Visit (HOSPITAL_COMMUNITY): Payer: Medicaid Other

## 2012-08-02 ENCOUNTER — Other Ambulatory Visit: Payer: Self-pay

## 2012-08-03 ENCOUNTER — Other Ambulatory Visit (INDEPENDENT_AMBULATORY_CARE_PROVIDER_SITE_OTHER): Payer: Medicaid Other

## 2012-08-03 DIAGNOSIS — I2510 Atherosclerotic heart disease of native coronary artery without angina pectoris: Secondary | ICD-10-CM

## 2012-08-03 LAB — LIPID PANEL
Cholesterol: 140 mg/dL (ref 0–200)
HDL: 38.7 mg/dL — ABNORMAL LOW (ref >39.00)
LDL Cholesterol: 79 mg/dL (ref 0–99)
Total CHOL/HDL Ratio: 4
Triglycerides: 112 mg/dL (ref 0.0–149.0)
VLDL: 22.4 mg/dL (ref 0.0–40.0)

## 2012-08-03 LAB — HEPATIC FUNCTION PANEL
ALT: 49 U/L — ABNORMAL HIGH (ref 0–35)
AST: 61 U/L — ABNORMAL HIGH (ref 0–37)
Albumin: 3.5 g/dL (ref 3.5–5.2)
Alkaline Phosphatase: 75 U/L (ref 39–117)
Bilirubin, Direct: 0.2 mg/dL (ref 0.0–0.3)
Total Bilirubin: 0.8 mg/dL (ref 0.3–1.2)
Total Protein: 7.2 g/dL (ref 6.0–8.3)

## 2012-08-17 ENCOUNTER — Other Ambulatory Visit: Payer: Self-pay | Admitting: *Deleted

## 2012-08-17 MED ORDER — HYDROCHLOROTHIAZIDE 12.5 MG PO CAPS: 12.5000 mg | ORAL_CAPSULE | Freq: Every day | ORAL | Status: DC

## 2012-08-17 MED ORDER — CLOPIDOGREL BISULFATE 75 MG PO TABS: 75.0000 mg | ORAL_TABLET | Freq: Every day | ORAL | Status: DC

## 2012-08-17 NOTE — Telephone Encounter (Signed)
Patient left message on refill line for generc plavix and metoprolol called in to [facility]. Called patient pharmacy to see if her Metoprolol had any refills. She has 5 more refills but actually needed refills for both generic Plavix and HCTZ. Plavix 75mg , once daily by mouth, #30, 5RF HCTZ 12.5 mg, once daily by mouth, #30 5RF   Micki Riley, CMA

## 2012-08-31 ENCOUNTER — Ambulatory Visit (HOSPITAL_COMMUNITY)
Admission: RE | Admit: 2012-08-31 | Discharge: 2012-08-31 | Disposition: A | Discharge status: Home / Self Care | Payer: Medicaid Other | Source: Ambulatory Visit | Attending: Cardiology | Admitting: Cardiology

## 2012-08-31 DIAGNOSIS — Z1231 Encounter for screening mammogram for malignant neoplasm of breast: Secondary | ICD-10-CM

## 2012-09-12 ENCOUNTER — Other Ambulatory Visit: Payer: Self-pay | Admitting: *Deleted

## 2012-09-12 MED ORDER — METOPROLOL TARTRATE 25 MG PO TABS: 25.0000 mg | ORAL_TABLET | Freq: Two times a day (BID) | ORAL | Status: DC

## 2012-09-12 MED ORDER — CLOPIDOGREL BISULFATE 75 MG PO TABS: 75.0000 mg | ORAL_TABLET | Freq: Every day | ORAL | Status: DC

## 2012-09-12 NOTE — Telephone Encounter (Signed)
I spoke with patient about a refill for atorvastatin. Pt states she has not taken it (or simvastatin)  in several months. Pt had lab 08/03/12 and states she was not on any cholesterol medication at that time. 08/03/12 AST 61 (37) ALT 49 (40). I will forward to Dr Shirlee Latch to review before I send in a prescription for atorvastatin. Pt is aware I will call her after review by Dr Shirlee Latch.

## 2012-09-12 NOTE — Telephone Encounter (Signed)
Pt calling to get refills for her Plavix and Metoprolol. Noticed pt Atorvastatin had not been refilled for a while. Asked pt if she need refills for the Atorvastatin or if her PCP was filling it. Pt states she stopped taking any statin since Dec 2013. Looked back in notes and noticed pt was to stop her Simvastatin and start Atorvastatin. Informed pt of this and she stated she will call pharmacy to refill and start taking. Informed Dr. Alford Highland nurse and she states she will f/u with pt to make appointment for labs.

## 2012-09-15 ENCOUNTER — Other Ambulatory Visit: Payer: Self-pay | Admitting: *Deleted

## 2012-09-15 DIAGNOSIS — I2510 Atherosclerotic heart disease of native coronary artery without angina pectoris: Secondary | ICD-10-CM

## 2012-09-15 DIAGNOSIS — E7800 Pure hypercholesterolemia, unspecified: Secondary | ICD-10-CM

## 2012-09-15 NOTE — Telephone Encounter (Signed)
Spoke with patient and reviewed with Dr Shirlee Latch. Pt to take atorvastatin 40mg  daily, liver profile in 1 month, and fasting lipid /liver profile in 2 months. Pt advised, verbalized understanding.

## 2012-10-12 ENCOUNTER — Other Ambulatory Visit (INDEPENDENT_AMBULATORY_CARE_PROVIDER_SITE_OTHER): Payer: Medicaid Other

## 2012-10-12 DIAGNOSIS — E7800 Pure hypercholesterolemia, unspecified: Secondary | ICD-10-CM

## 2012-10-12 DIAGNOSIS — I2510 Atherosclerotic heart disease of native coronary artery without angina pectoris: Secondary | ICD-10-CM

## 2012-10-12 LAB — HEPATIC FUNCTION PANEL
ALT: 39 U/L — ABNORMAL HIGH (ref 0–35)
AST: 41 U/L — ABNORMAL HIGH (ref 0–37)
Albumin: 3.5 g/dL (ref 3.5–5.2)
Alkaline Phosphatase: 69 U/L (ref 39–117)
Bilirubin, Direct: 0.1 mg/dL (ref 0.0–0.3)
Total Bilirubin: 0.5 mg/dL (ref 0.3–1.2)
Total Protein: 7.4 g/dL (ref 6.0–8.3)

## 2012-10-17 ENCOUNTER — Telehealth: Payer: Self-pay | Admitting: Cardiology

## 2012-10-17 NOTE — Telephone Encounter (Signed)
New Problem:    Patient called in returning a call from Anne from 10/13/12.   Please call back.

## 2012-10-17 NOTE — Telephone Encounter (Signed)
Pt was called with results

## 2012-11-03 NOTE — Telephone Encounter (Signed)
New Prob     Pt states she currently has a productive cough and would like to know what she is able to take for it or if something can be called in for her.

## 2012-11-03 NOTE — Telephone Encounter (Signed)
Called patient back. She is complaining of a  Non productive cough for several days without any fever. Advised Robitussin DM cough syrup prn.   If symptoms do not get better then she will go to the [facility].

## 2012-11-17 ENCOUNTER — Telehealth: Payer: Self-pay | Admitting: Cardiology

## 2012-11-17 ENCOUNTER — Other Ambulatory Visit (INDEPENDENT_AMBULATORY_CARE_PROVIDER_SITE_OTHER): Payer: Medicaid Other

## 2012-11-17 DIAGNOSIS — E7800 Pure hypercholesterolemia, unspecified: Secondary | ICD-10-CM

## 2012-11-17 DIAGNOSIS — I2510 Atherosclerotic heart disease of native coronary artery without angina pectoris: Secondary | ICD-10-CM

## 2012-11-17 LAB — HEPATIC FUNCTION PANEL
ALT: 34 U/L (ref 0–35)
AST: 44 U/L — ABNORMAL HIGH (ref 0–37)
Albumin: 3.5 g/dL (ref 3.5–5.2)
Alkaline Phosphatase: 69 U/L (ref 39–117)
Bilirubin, Direct: 0.1 mg/dL (ref 0.0–0.3)
Total Bilirubin: 0.4 mg/dL (ref 0.3–1.2)
Total Protein: 7 g/dL (ref 6.0–8.3)

## 2012-11-17 LAB — LIPID PANEL
Cholesterol: 136 mg/dL (ref 0–200)
HDL: 41.6 mg/dL (ref >39.00)
LDL Cholesterol: 73 mg/dL (ref 0–99)
Total CHOL/HDL Ratio: 3
Triglycerides: 108 mg/dL (ref 0.0–149.0)
VLDL: 21.6 mg/dL (ref 0.0–40.0)

## 2012-11-17 MED ORDER — ATORVASTATIN CALCIUM 80 MG PO TABS: 40.0000 mg | ORAL_TABLET | Freq: Every day | ORAL | Status: DC

## 2012-11-17 NOTE — Telephone Encounter (Signed)
LMTCB

## 2012-11-17 NOTE — Telephone Encounter (Signed)
Follow up  ° ° ° ° °Pt is returning your call  °

## 2012-11-17 NOTE — Telephone Encounter (Signed)
New message:  Pt states that she needs to verify which medications she should be taking and a prescription needs to be sent to [facility] at [place] for Lipitor if she is to be taking same. Please call patient and advise her.  [phone].

## 2012-11-17 NOTE — Telephone Encounter (Signed)
RX sent to [facility] to fill, pt is aware.

## 2012-12-23 ENCOUNTER — Ambulatory Visit (INDEPENDENT_AMBULATORY_CARE_PROVIDER_SITE_OTHER): Payer: Medicaid Other | Admitting: Cardiology

## 2012-12-23 ENCOUNTER — Encounter: Payer: Self-pay | Admitting: Cardiology

## 2012-12-23 VITALS — BP 130/80 | HR 67 | Ht 61.0 in | Wt 185.0 lb

## 2012-12-23 DIAGNOSIS — E785 Hyperlipidemia, unspecified: Secondary | ICD-10-CM

## 2012-12-23 DIAGNOSIS — R079 Chest pain, unspecified: Secondary | ICD-10-CM

## 2012-12-23 DIAGNOSIS — F17200 Nicotine dependence, unspecified, uncomplicated: Secondary | ICD-10-CM

## 2012-12-23 DIAGNOSIS — I10 Essential (primary) hypertension: Secondary | ICD-10-CM

## 2012-12-23 DIAGNOSIS — I2510 Atherosclerotic heart disease of native coronary artery without angina pectoris: Secondary | ICD-10-CM

## 2012-12-23 MED ORDER — LISINOPRIL-HYDROCHLOROTHIAZIDE 10-12.5 MG PO TABS: 1.0000 | ORAL_TABLET | Freq: Every day | ORAL | Status: DC

## 2012-12-23 NOTE — Patient Instructions (Addendum)
Stop HCTZ(hydrochlorothiazide).  Start lisinopril /HCT 10/12.5mg  daily  Your physician recommends that you return for lab work in: 2 weeks--BMET.  Your physician wants you to follow-up in: 6 months with Dr Shirlee Latch. (March 2015).  You will receive a reminder letter in the mail two months in advance. If you don't receive a letter, please call our office to schedule the follow-up appointment.

## 2012-12-25 NOTE — Progress Notes (Signed)
Patient ID: Vanessa Proctor, female   DOB: 1955/11/14, 57 y.o.   MRN: [id number]  57 yo with history of CAD s/p NSTEMI and CVA presents for cardiology followup.  Mrs Kuehnel had NSTEMI with PCI to RCA with DES in 11/12.  A few weeks after the NSTEMI, she had a CVA.  EF was preserved on echo.  Since that time, she has been symptomatically stable.  No chest pain.  She is chronically short of breath after walking about 1 block or walking up steps.  Weight is stable.  She is still smoking several cigarettes/day.  She is doing an exercise class at her church.  BP is ok in the office today but she says it has been running in the 160s/100s at home (checks daily).   Labs (11/12): K 3.9, creatinine 0.77 Labs (10/13): LDL 62, HDL 36, AST 47, ALT 43 Labs (11/13): AST 48, ALT 40 Labs (8/14): LDL 73, HDL 42  PMH: 1. HTN 2. Depression 3. CAD: NSTEMI in 11/12.  LHC with EF 55-60%, basal inferior HK, 99% mRCA stenosis, 40% OM1.  Patient had Promus DES to RCA.   4. CVA: 11/12, right external capsule.  Thought to be due to small vessel disease.  Carotid dopplers showed no significant stenosis.  5. Echo (11/12): EF 65-70%, no significant valvular disease.  6. Elevated transaminases: Mild, with statin use.  7. ABIs (9/13): Normal.  8. Active smoker.   SH: Unemployed, has 2 daughters, lives with one of her daughters.  Enrolled at [facility] currently.  Active smoker.   FH: Father with CHF.  ROS: All systems reviewed and negative except as per HPI.   Current Outpatient Prescriptions  Medication Sig Dispense Refill  . aspirin 81 MG chewable tablet Chew 81 mg by mouth daily.        Marland Kitchen atorvastatin (LIPITOR) 80 MG tablet Take 40 mg by mouth daily.      . clopidogrel (PLAVIX) 75 MG tablet Take 1 tablet (75 mg total) by mouth daily.  30 tablet  5  . hydrOXYzine (ATARAX/VISTARIL) 25 MG tablet Take 25 mg by mouth 3 (three) times daily. For anxiety      . metoprolol tartrate (LOPRESSOR) 25 MG tablet Take 1 tablet (25  mg total) by mouth 2 (two) times daily.  60 tablet  6  . nicotine (NICODERM CQ - DOSED IN MG/24 HOURS) 14 mg/24hr patch Place 1 patch onto the skin as needed.       . nitroGLYCERIN (NITROSTAT) 0.4 MG SL tablet Place 0.4 mg under the tongue every 5 (five) minutes as needed. For chest pain       . lisinopril-hydrochlorothiazide (PRINZIDE) 10-12.5 MG per tablet Take 1 tablet by mouth daily.  30 tablet  6   No current facility-administered medications for this visit.    BP 130/80  Pulse 67  Ht 5\' 1"  (1.549 m)  Wt 83.915 kg (185 lb)  BMI 34.97 kg/m2 General: NAD Neck: No JVD, no thyromegaly or thyroid nodule.  Lungs: Clear to auscultation bilaterally with normal respiratory effort. CV: Nondisplaced PMI.  Heart regular S1/S2, no S3/S4, 1/6 SEM.  Trace ankle edema.  No carotid bruit. Normal pulses.  Feet are warm.   Abdomen: Soft, nontender, no hepatosplenomegaly, no distention.  Neurologic: Alert and oriented x 3.  Psych: Normal affect. Extremities: No clubbing or cyanosis.    Assessment/Plan 1. CAD: No ischemic symptoms. She will continue ASA 81, metoprolol, and statin.   2. Hyperlipidemia: Good lipids on atorvastatin 40.  3. HTN: BP is running high, regularly 160s/100s at home.  I will stop HCTZ and instead use lisinopril/HCTZ 10/12.5 with BMET in 2 wks.  4. Smoking: I strongly advised her to quit. She will try nicotine patches.   Marca Ancona 12/25/2012

## 2013-01-06 ENCOUNTER — Other Ambulatory Visit (INDEPENDENT_AMBULATORY_CARE_PROVIDER_SITE_OTHER): Payer: Medicaid Other

## 2013-01-06 DIAGNOSIS — E785 Hyperlipidemia, unspecified: Secondary | ICD-10-CM

## 2013-01-06 DIAGNOSIS — I2510 Atherosclerotic heart disease of native coronary artery without angina pectoris: Secondary | ICD-10-CM

## 2013-01-06 LAB — BASIC METABOLIC PANEL
BUN: 6 mg/dL (ref 6–23)
CO2: 27 mEq/L (ref 19–32)
Calcium: 10.1 mg/dL (ref 8.4–10.5)
Chloride: 100 mEq/L (ref 96–112)
Creatinine, Ser: 0.8 mg/dL (ref 0.4–1.2)
GFR: 95.59 mL/min (ref >60.00)
Glucose, Bld: 108 mg/dL — ABNORMAL HIGH (ref 70–99)
Potassium: 4 mEq/L (ref 3.5–5.1)
Sodium: 134 mEq/L — ABNORMAL LOW (ref 135–145)

## 2013-01-16 ENCOUNTER — Other Ambulatory Visit: Payer: Self-pay | Admitting: Cardiology

## 2013-01-18 ENCOUNTER — Telehealth: Payer: Self-pay | Admitting: Cardiology

## 2013-01-18 NOTE — Telephone Encounter (Signed)
New Problem:  Pt states she feels like she is having side effects from lisinopril... Nausea, gagging, and spitting up... Pt would like to be advised on what to do.Marland KitchenMarland Kitchen

## 2013-01-18 NOTE — Telephone Encounter (Signed)
Instead of lisinopril/HCTZ, she can try losartan 50 mg daily + HCTZ 12.5 mg daily.

## 2013-01-18 NOTE — Telephone Encounter (Signed)
Pt states she had nausea and vomiting before starting lisinopril but is having more frequent nausea and vomiting since starting it. She also has had extensive oral procedures  since starting lisinopril. Pt did say that she developed a dry cough shortly after starting lisinopril. Pt is asking if should refill lisinopril. I will forward to Dr Shirlee Latch for review.

## 2013-01-19 MED ORDER — HYDROCHLOROTHIAZIDE 25 MG PO TABS: ORAL_TABLET | ORAL | Status: DC

## 2013-01-19 MED ORDER — LOSARTAN POTASSIUM 50 MG PO TABS: 50.0000 mg | ORAL_TABLET | Freq: Every day | ORAL | Status: DC

## 2013-01-19 NOTE — Telephone Encounter (Signed)
Pt advised and agreed with plan. 

## 2013-02-28 ENCOUNTER — Ambulatory Visit: Payer: Self-pay | Admitting: Internal Medicine

## 2013-03-20 ENCOUNTER — Other Ambulatory Visit: Payer: Self-pay | Admitting: Cardiology

## 2013-03-22 ENCOUNTER — Ambulatory Visit: Payer: Medicaid Other | Attending: Internal Medicine | Admitting: Internal Medicine

## 2013-03-22 ENCOUNTER — Encounter: Payer: Self-pay | Admitting: Internal Medicine

## 2013-03-22 VITALS — BP 141/83 | HR 77 | Temp 98.7°F | Resp 14 | Ht 61.0 in | Wt 180.8 lb

## 2013-03-22 DIAGNOSIS — Z7689 Persons encountering health services in other specified circumstances: Secondary | ICD-10-CM

## 2013-03-22 DIAGNOSIS — I10 Essential (primary) hypertension: Secondary | ICD-10-CM

## 2013-03-22 LAB — CBC WITH DIFFERENTIAL/PLATELET
Basophils Absolute: 0 10*3/uL (ref 0.0–0.1)
Basophils Relative: 0 % (ref 0–1)
Eosinophils Absolute: 0.5 10*3/uL (ref 0.0–0.7)
Eosinophils Relative: 8 % — ABNORMAL HIGH (ref 0–5)
HCT: 38.3 % (ref 36.0–46.0)
Hemoglobin: 13.1 g/dL (ref 12.0–15.0)
Lymphocytes Relative: 38 % (ref 12–46)
Lymphs Abs: 2.2 10*3/uL (ref 0.7–4.0)
MCH: 27.8 pg (ref 26.0–34.0)
MCHC: 34.2 g/dL (ref 30.0–36.0)
MCV: 81.1 fL (ref 78.0–100.0)
Monocytes Absolute: 0.7 10*3/uL (ref 0.1–1.0)
Monocytes Relative: 13 % — ABNORMAL HIGH (ref 3–12)
Neutro Abs: 2.4 10*3/uL (ref 1.7–7.7)
Neutrophils Relative %: 41 % — ABNORMAL LOW (ref 43–77)
Platelets: 290 10*3/uL (ref 150–400)
RBC: 4.72 MIL/uL (ref 3.87–5.11)
RDW: 14.3 % (ref 11.5–15.5)
WBC: 5.8 10*3/uL (ref 4.0–10.5)

## 2013-03-22 LAB — COMPLETE METABOLIC PANEL WITH GFR
ALT: 38 U/L — ABNORMAL HIGH (ref 0–35)
AST: 42 U/L — ABNORMAL HIGH (ref 0–37)
Albumin: 4 g/dL (ref 3.5–5.2)
Alkaline Phosphatase: 80 U/L (ref 39–117)
BUN: 7 mg/dL (ref 6–23)
CO2: 29 mEq/L (ref 19–32)
Calcium: 10.2 mg/dL (ref 8.4–10.5)
Chloride: 97 mEq/L (ref 96–112)
Creat: 0.78 mg/dL (ref 0.50–1.10)
GFR, Est African American: >89 mL/min
GFR, Est Non African American: 83 mL/min
Glucose, Bld: 109 mg/dL — ABNORMAL HIGH (ref 70–99)
Potassium: 4.5 mEq/L (ref 3.5–5.3)
Sodium: 133 mEq/L — ABNORMAL LOW (ref 135–145)
Total Bilirubin: 0.5 mg/dL (ref 0.3–1.2)
Total Protein: 7.1 g/dL (ref 6.0–8.3)

## 2013-03-22 LAB — TSH: TSH: 1.107 u[IU]/mL (ref 0.350–4.500)

## 2013-03-22 MED ORDER — TRAMADOL HCL 50 MG PO TABS: 50.0000 mg | ORAL_TABLET | Freq: Three times a day (TID) | ORAL | Status: DC | PRN

## 2013-03-22 MED ORDER — DOCUSATE SODIUM 100 MG PO CAPS: 100.0000 mg | ORAL_CAPSULE | Freq: Two times a day (BID) | ORAL | Status: DC

## 2013-03-22 MED ORDER — POLYETHYLENE GLYCOL 3350 17 GM/SCOOP PO POWD: 17.0000 g | Freq: Two times a day (BID) | ORAL | Status: DC | PRN

## 2013-03-22 NOTE — Progress Notes (Unsigned)
Pt is here to establish care.  Pt has arthritis in lower back and Rt knee pain x8 months. Pain is unbearable after standing too long, no pain today. Pain on upper Rt side of rib cage x3 weeks.
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
## 2013-03-24 ENCOUNTER — Telehealth: Payer: Self-pay | Admitting: Emergency Medicine

## 2013-03-24 NOTE — Telephone Encounter (Signed)
Pt given lab results 

## 2013-03-24 NOTE — Telephone Encounter (Signed)
Message copied by Darlis Loan on Fri Mar 24, 2013 10:54 AM ------      Message from: Susie Cassette MD, Germain Osgood      Created: Fri Mar 24, 2013 10:33 AM       Mild elevated liver function  with statin use.  Otherwise normal labs ------

## 2013-03-27 ENCOUNTER — Other Ambulatory Visit: Payer: Self-pay | Admitting: Gastroenterology

## 2013-03-30 ENCOUNTER — Ambulatory Visit (HOSPITAL_COMMUNITY)
Admission: RE | Admit: 2013-03-30 | Discharge: 2013-03-30 | Disposition: A | Discharge status: Home / Self Care | Payer: Medicaid Other | Source: Ambulatory Visit | Attending: Internal Medicine | Admitting: Internal Medicine

## 2013-03-30 ENCOUNTER — Ambulatory Visit (HOSPITAL_COMMUNITY): Admission: RE | Admit: 2013-03-30 | Payer: Medicaid Other | Source: Ambulatory Visit

## 2013-03-30 DIAGNOSIS — Z7689 Persons encountering health services in other specified circumstances: Secondary | ICD-10-CM

## 2013-03-30 DIAGNOSIS — K5900 Constipation, unspecified: Secondary | ICD-10-CM | POA: Insufficient documentation

## 2013-04-07 ENCOUNTER — Encounter (HOSPITAL_COMMUNITY): Payer: Self-pay | Admitting: Pharmacy Technician

## 2013-04-11 ENCOUNTER — Telehealth: Payer: Self-pay | Admitting: Internal Medicine

## 2013-04-11 ENCOUNTER — Encounter (HOSPITAL_COMMUNITY): Payer: Self-pay | Admitting: *Deleted

## 2013-04-11 ENCOUNTER — Telehealth: Payer: Self-pay | Admitting: Emergency Medicine

## 2013-04-11 NOTE — Telephone Encounter (Signed)
Pt call regarding her X-ray results, please contact pt

## 2013-04-11 NOTE — Telephone Encounter (Signed)
Pt called in today to see if she can get the results of her lab work/x-rays; please call pt on her cell phone

## 2013-04-11 NOTE — Telephone Encounter (Signed)
Spoke with pt. Lab results and imaging given. Pt c/o continued n/v Scheduled for Colonoscopy 1/16

## 2013-04-21 ENCOUNTER — Other Ambulatory Visit: Payer: Self-pay | Admitting: Cardiology

## 2013-04-24 ENCOUNTER — Telehealth: Payer: Self-pay | Admitting: Cardiology

## 2013-04-24 MED ORDER — CLOPIDOGREL BISULFATE 75 MG PO TABS: 75.0000 mg | ORAL_TABLET | Freq: Every day | ORAL | Status: DC

## 2013-04-24 MED ORDER — ATORVASTATIN CALCIUM 80 MG PO TABS: 40.0000 mg | ORAL_TABLET | Freq: Every day | ORAL | Status: DC

## 2013-04-24 MED ORDER — HYDROCHLOROTHIAZIDE 25 MG PO TABS: ORAL_TABLET | ORAL | Status: DC

## 2013-04-24 MED ORDER — METOPROLOL TARTRATE 25 MG PO TABS: 25.0000 mg | ORAL_TABLET | Freq: Two times a day (BID) | ORAL | Status: DC

## 2013-04-24 NOTE — Telephone Encounter (Signed)
New problem   Pt need to know if she need to stop taking her Plavix 4 days before colonoscopy. Please advise pt

## 2013-04-24 NOTE — Telephone Encounter (Signed)
Pt advised.

## 2013-04-24 NOTE — Telephone Encounter (Signed)
Usually the GI MD wants Plavix held prior to colonoscopy.  Would hold x 5 days prior if that is what they want and restart afterwards.  She is 2 yrs out from PCI.

## 2013-04-24 NOTE — Telephone Encounter (Signed)
Will forward to Dr McLean for review and recommendations.  

## 2013-04-28 ENCOUNTER — Encounter (HOSPITAL_COMMUNITY): Payer: Self-pay | Admitting: *Deleted

## 2013-04-28 ENCOUNTER — Encounter (HOSPITAL_COMMUNITY): Payer: Medicaid Other | Admitting: Anesthesiology

## 2013-04-28 ENCOUNTER — Ambulatory Visit (HOSPITAL_COMMUNITY): Payer: Medicaid Other | Admitting: Anesthesiology

## 2013-04-28 ENCOUNTER — Ambulatory Visit (HOSPITAL_COMMUNITY)
Admission: RE | Admit: 2013-04-28 | Discharge: 2013-04-28 | Disposition: A | Discharge status: Home / Self Care | Payer: Medicaid Other | Source: Ambulatory Visit | Attending: Gastroenterology | Admitting: Gastroenterology

## 2013-04-28 ENCOUNTER — Encounter (HOSPITAL_COMMUNITY)
Admission: RE | Disposition: A | Discharge status: Home / Self Care | Payer: Self-pay | Source: Ambulatory Visit | Attending: Gastroenterology

## 2013-04-28 DIAGNOSIS — K921 Melena: Secondary | ICD-10-CM | POA: Insufficient documentation

## 2013-04-28 DIAGNOSIS — D126 Benign neoplasm of colon, unspecified: Secondary | ICD-10-CM | POA: Insufficient documentation

## 2013-04-28 DIAGNOSIS — Z8673 Personal history of transient ischemic attack (TIA), and cerebral infarction without residual deficits: Secondary | ICD-10-CM | POA: Insufficient documentation

## 2013-04-28 DIAGNOSIS — I2510 Atherosclerotic heart disease of native coronary artery without angina pectoris: Secondary | ICD-10-CM | POA: Insufficient documentation

## 2013-04-28 DIAGNOSIS — K5900 Constipation, unspecified: Secondary | ICD-10-CM | POA: Insufficient documentation

## 2013-04-28 DIAGNOSIS — Z800 Family history of malignant neoplasm of digestive organs: Secondary | ICD-10-CM | POA: Insufficient documentation

## 2013-04-28 DIAGNOSIS — I252 Old myocardial infarction: Secondary | ICD-10-CM | POA: Insufficient documentation

## 2013-04-28 DIAGNOSIS — F17200 Nicotine dependence, unspecified, uncomplicated: Secondary | ICD-10-CM | POA: Insufficient documentation

## 2013-04-28 DIAGNOSIS — I10 Essential (primary) hypertension: Secondary | ICD-10-CM | POA: Insufficient documentation

## 2013-04-28 DIAGNOSIS — R079 Chest pain, unspecified: Secondary | ICD-10-CM | POA: Insufficient documentation

## 2013-04-28 DIAGNOSIS — Z9861 Coronary angioplasty status: Secondary | ICD-10-CM | POA: Insufficient documentation

## 2013-04-28 HISTORY — DX: Constipation, unspecified: K59.00

## 2013-04-28 HISTORY — DX: Nausea with vomiting, unspecified: R11.2

## 2013-04-28 HISTORY — PX: COLONOSCOPY: SHX5424

## 2013-04-28 HISTORY — DX: Hemorrhage of anus and rectum: K62.5

## 2013-04-28 SURGERY — COLONOSCOPY
Anesthesia: Monitor Anesthesia Care

## 2013-04-28 MED ORDER — PROPOFOL INFUSION 10 MG/ML OPTIME
INTRAVENOUS | Status: DC | PRN
  Administered 2013-04-28: 300 ug/kg/min via INTRAVENOUS

## 2013-04-28 MED ORDER — SODIUM CHLORIDE 0.9 % IV SOLN: INTRAVENOUS | Status: DC

## 2013-04-28 MED ORDER — LACTATED RINGERS IV SOLN
INTRAVENOUS | Status: DC
  Administered 2013-04-28: 1000 mL via INTRAVENOUS

## 2013-04-28 NOTE — Transfer of Care (Signed)
Immediate Anesthesia Transfer of Care Note  Patient: Vanessa Proctor  Procedure(s) Performed: Procedure(s): COLONOSCOPY (N/A)  Patient Location: PACU and Endoscopy Unit  Anesthesia Type:MAC  Level of Consciousness: patient cooperative  Airway & Oxygen Therapy: Patient Spontanous Breathing and Patient connected to nasal cannula oxygen  Post-op Assessment: Post -op Vital signs reviewed and stable  Post vital signs: Reviewed and stable  Complications: No apparent anesthesia complications

## 2013-04-28 NOTE — Anesthesia Preprocedure Evaluation (Signed)
Anesthesia Evaluation  Patient identified by MRN, date of birth, ID band Patient awake    Reviewed: Allergy & Precautions, H&P , NPO status , Patient's Chart, lab work & pertinent test results  Airway Mallampati: II TM Distance: >3 FB Neck ROM: Full    Dental no notable dental hx. (+) Edentulous Upper and Edentulous Lower   Pulmonary Current Smoker,  breath sounds clear to auscultation  Pulmonary exam normal       Cardiovascular hypertension, + CAD, + Past MI (2012) and + Cardiac Stents Rhythm:Regular Rate:Normal     Neuro/Psych negative neurological ROS  negative psych ROS   GI/Hepatic negative GI ROS, Neg liver ROS,   Endo/Other  negative endocrine ROS  Renal/GU negative Renal ROS  negative genitourinary   Musculoskeletal negative musculoskeletal ROS (+)   Abdominal   Peds negative pediatric ROS (+)  Hematology negative hematology ROS (+)   Anesthesia Other Findings   Reproductive/Obstetrics negative OB ROS                           Anesthesia Physical Anesthesia Plan  ASA: II  Anesthesia Plan: MAC   Post-op Pain Management:    Induction:   Airway Management Planned: Simple Face Mask  Additional Equipment:   Intra-op Plan:   Post-operative Plan:   Informed Consent: I have reviewed the patients History and Physical, chart, labs and discussed the procedure including the risks, benefits and alternatives for the proposed anesthesia with the patient or authorized representative who has indicated his/her understanding and acceptance.   Dental advisory given  Plan Discussed with: CRNA  Anesthesia Plan Comments:         Anesthesia Quick Evaluation

## 2013-04-28 NOTE — Anesthesia Postprocedure Evaluation (Signed)
  Anesthesia Post-op Note  Patient: Vanessa Proctor  Procedure(s) Performed: Procedure(s) (LRB): COLONOSCOPY (N/A)  Patient Location: PACU  Anesthesia Type: MAC  Level of Consciousness: awake and alert   Airway and Oxygen Therapy: Patient Spontanous Breathing  Post-op Pain: mild  Post-op Assessment: Post-op Vital signs reviewed, Patient's Cardiovascular Status Stable, Respiratory Function Stable, Patent Airway and No signs of Nausea or vomiting  Last Vitals:  Filed Vitals:   04/28/13 1115  BP: 166/74  Pulse:   Temp:   Resp: 19    Post-op Vital Signs: stable   Complications: No apparent anesthesia complications

## 2013-04-28 NOTE — Telephone Encounter (Signed)
NO OTHER INFO °

## 2013-04-28 NOTE — Preoperative (Signed)
Beta Blockers   Reason not to administer Beta Blockers:Not Applicable 

## 2013-04-28 NOTE — H&P (Signed)
  Vanessa Proctor HPI: This is a 58 year old female with complaints of hematochezia and constipation.  Her bleeding was consistent over the past month.  In the past it was intermittent and typically occurred with a harder bowel movement.  Constipation is still a problem for her.  There is a family history of colon cancer in her brother who was diagnosed at the age of 41-62.  The patient has issues with chest pain with exertion.  Past Medical History  Diagnosis Date  . Hypertension   . Depression   . Myocardial infarction     november 1st 2012  . Coronary artery disease   . Stroke     november, 2012  . Arthritis     knees  . Nausea & vomiting last month  . Constipation last month  . Rectal bleeding     Past Surgical History  Procedure Laterality Date  . Abdominal hysterectomy  1985    partial  . Coronary angioplasty with stent placement  2012    x 1  . Tonsillectomy  1980  . Knot drained and removed from side of face  2007  . Finger fracture surgery Left     reduction with  splint    Family History  Problem Relation Age of Onset  . Coronary artery disease    . Cancer Mother     mass in abdomin  . Pancreatic cancer Sister   . Stroke Brother     Social History:  reports that she has been smoking Cigarettes.  She has a 10 pack-year smoking history. She has never used smokeless tobacco. She reports that she does not drink alcohol or use illicit drugs.  Allergies:  Allergies  Allergen Reactions  . Bactrim Rash  . Sulfa Antibiotics Rash    Medications:  Scheduled:  Continuous: . sodium chloride      No results found for this or any previous visit (from the past 24 hour(s)).   No results found.  ROS:  As stated above in the HPI otherwise negative.  Pulse 60, temperature 98.3 F (36.8 C), temperature source Oral, resp. rate 14, height 5\' 1"  (1.549 m), weight 185 lb (83.915 kg), SpO2 98.00%.    PE: Gen: NAD, Alert and Oriented HEENT:  [place] Neck/AT, EOMI Neck:  Supple, no LAD Lungs: CTA Bilaterally CV: RRR without M/G/R ABM: Soft, NTND, +BS Ext: No C/C/E  Assessment/Plan: 1) Hematochezia. 2) Constipation.  Plan: 1) Colonoscopy.  Marin Wisner D 04/28/2013, 9:58 AM

## 2013-04-28 NOTE — Op Note (Signed)
[facility] [place], [address]   OPERATIVE PROCEDURE REPORT  PATIENT: Vanessa Proctor, Vanessa Proctor  MR#: [id number] BIRTHDATE: 1955-09-09  GENDER: Female ENDOSCOPIST: Carol Ada, MD ASSISTANT:   Estelle June, RN Corliss Parish, technician PROCEDURE DATE: 04/28/2013 PROCEDURE:   Colonoscopy with snare polypectomy ASA CLASS:   Class III INDICATIONS: Hematochezia MEDICATIONS: MAC sedation, administered by CRNA  DESCRIPTION OF PROCEDURE:   After the risks benefits and alternatives of the procedure were thoroughly explained, informed consent was obtained.  A digital rectal exam revealed no abnormalities of the rectum.    The Pentax Colonoscope [id number] endoscope was introduced through the anus  and advanced to the terminal ileum which was intubated for a short distance , No adverse events experienced.    The quality of the prep was excellent. .  The instrument was then slowly withdrawn as the colon was fully examined.     FINDINGS: In the transverse colon a 3 mm sessile polyp was removed with a cold snare.  No evidence of any masses, inflammation, ulcerations, erosions, or vascular abnormalities.   Retroflexed views revealed internal/external hemorrhoids.     The scope was then withdrawn from the patient and the procedure terminated.  COMPLICATIONS: There were no complications.  IMPRESSION: 1) Transverse colon polyp. 2) Int/Ext hemorrhoids.  RECOMMENDATIONS: 1) Await biopsy results. 2) Repeat the colonoscopy in 5-10 years. 3) Follow up in one month for possible hemorrhoidal banding.  _______________________________ eSigned:  Carol Ada, MD 04/28/2013 10:47 AM

## 2013-04-28 NOTE — Discharge Instructions (Signed)
Conscious Sedation, Adult, Care After °Refer to this sheet in the next few weeks. These instructions provide you with information on caring for yourself after your procedure. Your health care provider may also give you more specific instructions. Your treatment has been planned according to current medical practices, but problems sometimes occur. Call your health care provider if you have any problems or questions after your procedure. °WHAT TO EXPECT AFTER THE PROCEDURE  °After your procedure: °· You may feel sleepy, clumsy, and have poor balance for several hours. °· Vomiting may occur if you eat too soon after the procedure. °HOME CARE INSTRUCTIONS °· Do not participate in any activities where you could become injured for at least 24 hours. Do not: °· Drive. °· Swim. °· Ride a bicycle. °· Operate heavy machinery. °· Cook. °· Use power tools. °· Climb ladders. °· Work from a high place. °· Do not make important decisions or sign legal documents until you are improved. °· If you vomit, drink water, juice, or soup when you can drink without vomiting. Make sure you have little or no nausea before eating solid foods. °· Only take over-the-counter or prescription medicines for pain, discomfort, or fever as directed by your health care provider. °· Make sure you and your family fully understand everything about the medicines given to you, including what side effects may occur. °· You should not drink alcohol, take sleeping pills, or take medicines that cause drowsiness for at least 24 hours. °· If you smoke, do not smoke without supervision. °· If you are feeling better, you may resume normal activities 24 hours after you were sedated. °· Keep all appointments with your health care provider. °SEEK MEDICAL CARE IF: °· Your skin is pale or bluish in color. °· You continue to feel nauseous or vomit. °· Your pain is getting worse and is not helped by medicine. °· You have bleeding or swelling. °· You are still sleepy or  feeling clumsy after 24 hours. °SEEK IMMEDIATE MEDICAL CARE IF: °· You develop a rash. °· You have difficulty breathing. °· You develop any type of allergic problem. °· You have a fever. °MAKE SURE YOU: °· Understand these instructions. °· Will watch your condition. °· Will get help right away if you are not doing well or get worse. °Document Released: 01/18/2013 Document Reviewed: 11/04/2012 °Exit[employer]® Patient Information ©2014 [employer]. °Colonoscopy °Care After °These instructions give you information on caring for yourself after your procedure. Your doctor may also give you more specific instructions. Call your doctor if you have any problems or questions after your procedure. °HOME CARE °· Take it easy for the next 24 hours. °· Rest. °· Walk or use warm packs on your belly (abdomen) if you have belly cramping or gas. °· Do not drive for 24 hours. °· You may shower. °· Do not sign important papers or use machinery for 24 hours. °· Drink enough fluids to keep your pee (urine) clear or pale yellow. °· Resume your normal diet. Avoid heavy or fried foods. °· Avoid alcohol. °· Continue taking your normal medicines. °· Only take medicine as told by your doctor. Do not take aspirin. °If you had growths (polyps) removed: °· Do not take aspirin. °· Do not drink alcohol for 7 days or as told by your doctor. °· Eat a soft diet for 24 hours. °GET HELP RIGHT AWAY IF: °· You have a fever. °· You pass clumps of tissue (blood clots) or fill the toilet with blood. °· You   have belly pain that gets worse and medicine does not help. °· Your belly is puffy (swollen). °· You feel sick to your stomach (nauseous) or throw up (vomit). °MAKE SURE YOU: °· Understand these instructions. °· Will watch your condition. °· Will get help right away if you are not doing well or get worse. °Document Released: 05/02/2010 Document Revised: 06/22/2011 Document Reviewed: 12/05/2012 °ExitCare® Patient Information ©2014 [employer]. ° °

## 2013-05-01 ENCOUNTER — Encounter (HOSPITAL_COMMUNITY): Payer: Self-pay | Admitting: Gastroenterology

## 2013-06-20 ENCOUNTER — Ambulatory Visit: Payer: Self-pay | Admitting: Internal Medicine

## 2013-06-28 ENCOUNTER — Ambulatory Visit: Payer: Self-pay | Admitting: Cardiology

## 2013-07-26 ENCOUNTER — Encounter: Payer: Self-pay | Admitting: Cardiology

## 2013-07-26 ENCOUNTER — Ambulatory Visit (INDEPENDENT_AMBULATORY_CARE_PROVIDER_SITE_OTHER): Payer: Medicare Other | Admitting: Cardiology

## 2013-07-26 VITALS — BP 147/85 | HR 82 | Ht 61.0 in | Wt 172.0 lb

## 2013-07-26 DIAGNOSIS — F17200 Nicotine dependence, unspecified, uncomplicated: Secondary | ICD-10-CM | POA: Diagnosis not present

## 2013-07-26 DIAGNOSIS — I10 Essential (primary) hypertension: Secondary | ICD-10-CM

## 2013-07-26 DIAGNOSIS — I2510 Atherosclerotic heart disease of native coronary artery without angina pectoris: Secondary | ICD-10-CM | POA: Diagnosis not present

## 2013-07-26 MED ORDER — LOSARTAN POTASSIUM 100 MG PO TABS: 100.0000 mg | ORAL_TABLET | Freq: Every day | ORAL | Status: DC

## 2013-07-26 NOTE — Patient Instructions (Signed)
Increase losartan 100mg  daily.  Your physician recommends that you return for lab work in: 2 weeks--BMET.  Your physician wants you to follow-up in: 6 months with Dr Aundra Dubin. (October 2015). You will receive a reminder letter in the mail two months in advance. If you don't receive a letter, please call our office to schedule the follow-up appointment.

## 2013-07-28 NOTE — Progress Notes (Signed)
Patient ID: Vanessa Proctor, female   DOB: 08-14-1955, 58 y.o.   MRN: [id number]  58 yo with history of CAD s/p NSTEMI and CVA presents for cardiology followup.  Vanessa Proctor had NSTEMI with PCI to RCA with DES in 11/12.  A few weeks after the NSTEMI, she had a CVA.  EF was preserved on echo.  Since that time, she has been symptomatically stable.  She had a brief episode of chest pain several weeks ago the night her father died but has had no chest pain otherwise.  She is chronically short of breath after walking about 2-3 blocks or walking up steps.  She is still smoking about 4 cigarettes/day.  BP is better but still running high.  Labs (11/12): K 3.9, creatinine 0.77 Labs (10/13): LDL 62, HDL 36, AST 47, ALT 43 Labs (11/13): AST 48, ALT 40 Labs (8/14): LDL 73, HDL 42 Labs (12/14): TSH normal, K 4.8, creatinine 0.78  PMH: 1. HTN 2. Depression 3. CAD: NSTEMI in 11/12.  LHC with EF 55-60%, basal inferior HK, 99% mRCA stenosis, 40% OM1.  Patient had Promus DES to RCA.   4. CVA: 11/12, right external capsule.  Thought to be due to small vessel disease.  Carotid dopplers showed no significant stenosis.  5. Echo (11/12): EF 65-70%, no significant valvular disease.  6. Elevated transaminases: Mild, with statin use.  7. ABIs (9/13): Normal.  8. Active smoker.   SH: Unemployed, has 2 daughters.  Enrolled at [employer] [employer] currently.  Active smoker.   FH: Father with CHF.  ROS: All systems reviewed and negative except as per HPI.   Current Outpatient Prescriptions  Medication Sig Dispense Refill  . aspirin 81 MG chewable tablet Chew 81 mg by mouth daily.       Marland Kitchen atorvastatin (LIPITOR) 80 MG tablet Take 0.5 tablets (40 mg total) by mouth daily.  30 tablet  6  . clopidogrel (PLAVIX) 75 MG tablet Take 1 tablet (75 mg total) by mouth daily with breakfast.  30 tablet  0  . clopidogrel (PLAVIX) 75 MG tablet Take 1 tablet (75 mg total) by mouth daily with breakfast.  30 tablet  6  . docusate sodium  (COLACE) 100 MG capsule Take 100 mg by mouth 2 (two) times daily.      . hydrochlorothiazide (HYDRODIURIL) 25 MG tablet 1/2 tablet (total 12.5mg ) daily  15 tablet  6  . hydrOXYzine (ATARAX/VISTARIL) 25 MG tablet Take 25 mg by mouth every 8 (eight) hours as needed for anxiety.       Marland Kitchen losartan (COZAAR) 100 MG tablet Take 1 tablet (100 mg total) by mouth daily.  30 tablet  6  . metoprolol tartrate (LOPRESSOR) 25 MG tablet Take 1 tablet (25 mg total) by mouth 2 (two) times daily.  60 tablet  6  . nicotine (NICODERM CQ - DOSED IN MG/24 HOURS) 14 mg/24hr patch Place 1 patch onto the skin as needed.       . nitroGLYCERIN (NITROSTAT) 0.4 MG SL tablet Place 0.4 mg under the tongue every 5 (five) minutes as needed for chest pain.       . polyethylene glycol powder (GLYCOLAX/MIRALAX) powder Take 17 g by mouth 2 (two) times daily as needed.  3350 g  1  . traMADol (ULTRAM) 50 MG tablet Take 50 mg by mouth every 8 (eight) hours as needed for moderate pain or severe pain.       No current facility-administered medications for this visit.    BP  147/85  Pulse 82  Ht 5\' 1"  (1.549 m)  Wt 78.019 kg (172 lb)  BMI 32.52 kg/m2 General: NAD Neck: No JVD, no thyromegaly or thyroid nodule.  Lungs: Clear to auscultation bilaterally with normal respiratory effort. CV: Nondisplaced PMI.  Heart regular S1/S2, no S3/S4, 1/6 SEM.  Trace ankle edema.  No carotid bruit. Normal pulses.  Feet are warm.   Abdomen: Soft, nontender, no hepatosplenomegaly, no distention.  Neurologic: Alert and oriented x 3.  Psych: Normal affect. Extremities: No clubbing or cyanosis.   Assessment/Plan 1. CAD: No ischemic symptoms. She will continue ASA 81, metoprolol, and statin.   2. Hyperlipidemia: Good lipids on atorvastatin 40.   3. HTN: BP is running high still though lower than in the past.  I will have her increase losartan to 100 mg daily.  She will need BMET in 2 wks.   4. Smoking: I again strongly advised her to quit. She is going  to get nicotine patches.   Larey Dresser 07/28/2013

## 2013-08-02 ENCOUNTER — Other Ambulatory Visit: Payer: Self-pay | Admitting: Internal Medicine

## 2013-08-02 DIAGNOSIS — Z1231 Encounter for screening mammogram for malignant neoplasm of breast: Secondary | ICD-10-CM

## 2013-08-09 ENCOUNTER — Other Ambulatory Visit (INDEPENDENT_AMBULATORY_CARE_PROVIDER_SITE_OTHER): Payer: Medicare Other

## 2013-08-09 DIAGNOSIS — I2510 Atherosclerotic heart disease of native coronary artery without angina pectoris: Secondary | ICD-10-CM

## 2013-08-09 DIAGNOSIS — I10 Essential (primary) hypertension: Secondary | ICD-10-CM | POA: Diagnosis not present

## 2013-08-09 LAB — BASIC METABOLIC PANEL
BUN: 15 mg/dL (ref 6–23)
CO2: 28 mEq/L (ref 19–32)
Calcium: 9.9 mg/dL (ref 8.4–10.5)
Chloride: 105 mEq/L (ref 96–112)
Creatinine, Ser: 0.8 mg/dL (ref 0.4–1.2)
GFR: 101.29 mL/min (ref >60.00)
Glucose, Bld: 93 mg/dL (ref 70–99)
Potassium: 4.3 mEq/L (ref 3.5–5.1)
Sodium: 138 mEq/L (ref 135–145)

## 2013-08-23 ENCOUNTER — Telehealth: Payer: Self-pay | Admitting: Cardiology

## 2013-08-23 ENCOUNTER — Other Ambulatory Visit: Payer: Self-pay

## 2013-08-23 DIAGNOSIS — I10 Essential (primary) hypertension: Secondary | ICD-10-CM

## 2013-08-23 DIAGNOSIS — I2510 Atherosclerotic heart disease of native coronary artery without angina pectoris: Secondary | ICD-10-CM

## 2013-08-23 MED ORDER — LOSARTAN POTASSIUM 100 MG PO TABS: 100.0000 mg | ORAL_TABLET | Freq: Every day | ORAL | Status: DC

## 2013-08-23 NOTE — Telephone Encounter (Deleted)
Error

## 2013-09-01 ENCOUNTER — Ambulatory Visit: Payer: Self-pay | Admitting: Cardiology

## 2013-09-05 ENCOUNTER — Ambulatory Visit (HOSPITAL_COMMUNITY)
Admission: RE | Admit: 2013-09-05 | Discharge: 2013-09-05 | Disposition: A | Discharge status: Home / Self Care | Payer: Medicare Other | Source: Ambulatory Visit | Attending: Internal Medicine | Admitting: Internal Medicine

## 2013-09-05 DIAGNOSIS — Z1231 Encounter for screening mammogram for malignant neoplasm of breast: Secondary | ICD-10-CM | POA: Insufficient documentation

## 2013-09-14 ENCOUNTER — Telehealth: Payer: Self-pay | Admitting: Internal Medicine

## 2013-09-14 NOTE — Telephone Encounter (Signed)
Returned pt's call.

## 2013-10-26 ENCOUNTER — Ambulatory Visit: Payer: Medicare Other | Attending: Internal Medicine | Admitting: Internal Medicine

## 2013-10-26 ENCOUNTER — Encounter: Payer: Self-pay | Admitting: Internal Medicine

## 2013-10-26 VITALS — BP 173/102 | HR 77 | Temp 98.6°F | Resp 16 | Ht 61.0 in | Wt 170.0 lb

## 2013-10-26 DIAGNOSIS — L02439 Carbuncle of limb, unspecified: Secondary | ICD-10-CM | POA: Diagnosis not present

## 2013-10-26 DIAGNOSIS — Z8673 Personal history of transient ischemic attack (TIA), and cerebral infarction without residual deficits: Secondary | ICD-10-CM | POA: Insufficient documentation

## 2013-10-26 DIAGNOSIS — I252 Old myocardial infarction: Secondary | ICD-10-CM | POA: Insufficient documentation

## 2013-10-26 DIAGNOSIS — L0292 Furuncle, unspecified: Secondary | ICD-10-CM | POA: Diagnosis not present

## 2013-10-26 DIAGNOSIS — L0293 Carbuncle, unspecified: Secondary | ICD-10-CM | POA: Diagnosis not present

## 2013-10-26 DIAGNOSIS — I2510 Atherosclerotic heart disease of native coronary artery without angina pectoris: Secondary | ICD-10-CM | POA: Diagnosis not present

## 2013-10-26 DIAGNOSIS — Z79899 Other long term (current) drug therapy: Secondary | ICD-10-CM | POA: Insufficient documentation

## 2013-10-26 DIAGNOSIS — F17200 Nicotine dependence, unspecified, uncomplicated: Secondary | ICD-10-CM | POA: Diagnosis not present

## 2013-10-26 DIAGNOSIS — Z7982 Long term (current) use of aspirin: Secondary | ICD-10-CM | POA: Insufficient documentation

## 2013-10-26 DIAGNOSIS — I10 Essential (primary) hypertension: Secondary | ICD-10-CM | POA: Insufficient documentation

## 2013-10-26 DIAGNOSIS — Z882 Allergy status to sulfonamides status: Secondary | ICD-10-CM | POA: Insufficient documentation

## 2013-10-26 DIAGNOSIS — B351 Tinea unguium: Secondary | ICD-10-CM | POA: Insufficient documentation

## 2013-10-26 DIAGNOSIS — F3289 Other specified depressive episodes: Secondary | ICD-10-CM | POA: Diagnosis not present

## 2013-10-26 DIAGNOSIS — L02429 Furuncle of limb, unspecified: Secondary | ICD-10-CM | POA: Insufficient documentation

## 2013-10-26 DIAGNOSIS — F329 Major depressive disorder, single episode, unspecified: Secondary | ICD-10-CM | POA: Diagnosis not present

## 2013-10-26 MED ORDER — DOXYCYCLINE HYCLATE 100 MG PO TABS
100.0000 mg | ORAL_TABLET | Freq: Two times a day (BID) | ORAL | Status: DC
Start: 2013-10-26 — End: 2014-11-19

## 2013-10-26 MED ORDER — TRAMADOL HCL 50 MG PO TABS: 50.0000 mg | ORAL_TABLET | Freq: Three times a day (TID) | ORAL | Status: DC | PRN

## 2013-10-26 NOTE — Progress Notes (Signed)
Patient ID: Vanessa Proctor, female   DOB: 1955-05-30, 58 y.o.   MRN: [id number]   CC: Followup  HPI: Patient is a 58 year old very pleasant female with history of hypertension, stroke in 2012, presenting to clinic for followup. She does have one concern of a possible boil under the left armpit. She noticed that several days ago and he feels very tender to touch. She denies fevers and chills, no chest pain or shortness of breath. She has not taken any medications this morning. She does check blood pressure regularly and says the numbers are typically 140/80.  Allergies  Allergen Reactions  . Bactrim Rash  . Sulfa Antibiotics Rash   Past Medical History  Diagnosis Date  . Hypertension   . Depression   . Myocardial infarction     november 1st 2012  . Coronary artery disease   . Stroke     november, 2012  . Arthritis     knees  . Nausea & vomiting last month  . Constipation last month  . Rectal bleeding    Current Outpatient Prescriptions on File Prior to Visit  Medication Sig Dispense Refill  . aspirin 81 MG chewable tablet Chew 81 mg by mouth daily.       Marland Kitchen atorvastatin (LIPITOR) 80 MG tablet Take 0.5 tablets (40 mg total) by mouth daily.  30 tablet  6  . clopidogrel (PLAVIX) 75 MG tablet Take 1 tablet (75 mg total) by mouth daily with breakfast.  30 tablet  6  . hydrochlorothiazide (HYDRODIURIL) 25 MG tablet 1/2 tablet (total 12.5mg ) daily  15 tablet  6  . hydrOXYzine (ATARAX/VISTARIL) 25 MG tablet Take 25 mg by mouth every 8 (eight) hours as needed for anxiety.       Marland Kitchen losartan (COZAAR) 100 MG tablet Take 1 tablet (100 mg total) by mouth daily.  30 tablet  6  . metoprolol tartrate (LOPRESSOR) 25 MG tablet Take 1 tablet (25 mg total) by mouth 2 (two) times daily.  60 tablet  6  . nicotine (NICODERM CQ - DOSED IN MG/24 HOURS) 14 mg/24hr patch Place 1 patch onto the skin as needed.       . nitroGLYCERIN (NITROSTAT) 0.4 MG SL tablet Place 0.4 mg under the tongue every 5 (five)  minutes as needed for chest pain.       . polyethylene glycol powder (GLYCOLAX/MIRALAX) powder Take 17 g by mouth 2 (two) times daily as needed.  3350 g  1  . clopidogrel (PLAVIX) 75 MG tablet Take 1 tablet (75 mg total) by mouth daily with breakfast.  30 tablet  0  . docusate sodium (COLACE) 100 MG capsule Take 100 mg by mouth 2 (two) times daily.       No current facility-administered medications on file prior to visit.   Family History  Problem Relation Age of Onset  . Coronary artery disease    . Cancer Mother     mass in abdomin  . Pancreatic cancer Sister   . Stroke Brother    History   Social History  . Marital Status: Single    Spouse Name: N/A    Number of Children: N/A  . Years of Education: N/A   Occupational History  . Not on file.   Social History Main Topics  . Smoking status: Current Every Day Smoker -- 0.25 packs/day for 40 years    Types: Cigarettes  . Smokeless tobacco: Never Used  . Alcohol Use: No     Comment: quit  drugs 2011  marjuana  . Drug Use: No  . Sexual Activity: Not on file   Other Topics Concern  . Not on file   Social History Narrative  . No narrative on file    Review of Systems  Constitutional: Negative for fever, chills, diaphoresis, activity change, appetite change and fatigue.  HENT: Negative for ear pain, nosebleeds, congestion, facial swelling, rhinorrhea, neck pain, neck stiffness and ear discharge.   Eyes: Negative for pain, discharge, redness, itching and visual disturbance.  Respiratory: Negative for cough, choking, chest tightness, shortness of breath, wheezing and stridor.   Cardiovascular: Negative for chest pain, palpitations and leg swelling.  Gastrointestinal: Negative for abdominal distention.  Genitourinary: Negative for dysuria, urgency, frequency, hematuria, flank pain, decreased urine volume, difficulty urinating and dyspareunia.  Musculoskeletal: Negative for back pain, joint swelling, arthralgias and gait  problem.  Neurological: Negative for dizziness, tremors, seizures, syncope, facial asymmetry, speech difficulty, weakness, light-headedness, numbness and headaches.  Hematological: Negative for adenopathy. Does not bruise/bleed easily.  Psychiatric/Behavioral: Negative for hallucinations, behavioral problems, confusion, dysphoric mood, decreased concentration and agitation.    Objective:   Filed Vitals:   10/26/13 0956  BP: 173/102  Pulse: 77  Temp: 98.6 F (37 C)  Resp: 16    Physical Exam  Constitutional: Appears well-developed and well-nourished. No distress.  CVS: RRR, S1/S2 +, no murmurs, no gallops, no carotid bruit.  Pulmonary: Effort and breath sounds normal, no stridor, rhonchi, wheezes, rales.  Abdominal: Soft. BS +,  no distension, tenderness, rebound or guarding.  Musculoskeletal: Normal range of motion. Small boil 1 cm in diameter, no erythema, it is tender to palpation    Lab Results  Component Value Date   WBC 5.8 03/22/2013   HGB 13.1 03/22/2013   HCT 38.3 03/22/2013   MCV 81.1 03/22/2013   PLT 290 03/22/2013   Lab Results  Component Value Date   CREATININE 0.8 08/09/2013   BUN 15 08/09/2013   NA 138 08/09/2013   K 4.3 08/09/2013   CL 105 08/09/2013   CO2 28 08/09/2013    Lab Results  Component Value Date   HGBA1C 6.1* 03/09/2011   Lipid Panel     Component Value Date/Time   CHOL 136 11/17/2012 0846   TRIG 108.0 11/17/2012 0846   HDL 41.60 11/17/2012 0846   CHOLHDL 3 11/17/2012 0846   VLDL 21.6 11/17/2012 0846   LDLCALC 73 11/17/2012 0846       Assessment and plan:   Patient Active Problem List   Diagnosis Date Noted   Hypertension - Compliance with medications discussed in detail and patient verbalized understanding - Will provide one dose of clonidine here in clinic - I asked patient if she needs refills and she says she has just refilled medicines yesterday - I also advised her to check blood pressure regularly and to call us back if the numbers  are persistently higher than 140/90 for that week and readjust her regimen Boil under the left armpit - Provide doxycycline for 5 days Fungal toenail - On the left great toe - Referral to podiatry per patient's request

## 2013-10-26 NOTE — Progress Notes (Signed)
Pt is here following up on her HTN and her chronic back pain. Pt states that she has pain in her left great toe possible fungus and ingrown toe nail.

## 2013-10-26 NOTE — Patient Instructions (Signed)
Hypertension Hypertension, commonly called high blood pressure, is when the force of blood pumping through your arteries is too strong. Your arteries are the blood vessels that carry blood from your heart throughout your body. A blood pressure reading consists of a higher number over a lower number, such as 110/72. The higher number (systolic) is the pressure inside your arteries when your heart pumps. The lower number (diastolic) is the pressure inside your arteries when your heart relaxes. Ideally you want your blood pressure below 120/80. Hypertension forces your heart to work harder to pump blood. Your arteries may become narrow or stiff. Having hypertension puts you at risk for heart disease, stroke, and other problems.  RISK FACTORS Some risk factors for high blood pressure are controllable. Others are not.  Risk factors you cannot control include:   Race. You may be at higher risk if you are African American.  Age. Risk increases with age.  Gender. Men are at higher risk than women before age 45 years. After age 65, women are at higher risk than men. Risk factors you can control include:  Not getting enough exercise or physical activity.  Being overweight.  Getting too much fat, sugar, calories, or salt in your diet.  Drinking too much alcohol. SIGNS AND SYMPTOMS Hypertension does not usually cause signs or symptoms. Extremely high blood pressure (hypertensive crisis) may cause headache, anxiety, shortness of breath, and nosebleed. DIAGNOSIS  To check if you have hypertension, your health care provider will measure your blood pressure while you are seated, with your arm held at the level of your heart. It should be measured at least twice using the same arm. Certain conditions can cause a difference in blood pressure between your right and left arms. A blood pressure reading that is higher than normal on one occasion does not mean that you need treatment. If one blood pressure reading  is high, ask your health care provider about having it checked again. TREATMENT  Treating high blood pressure includes making lifestyle changes and possibly taking medication. Living a healthy lifestyle can help lower high blood pressure. You may need to change some of your habits. Lifestyle changes may include:  Following the DASH diet. This diet is high in fruits, vegetables, and whole grains. It is low in salt, red meat, and added sugars.  Getting at least 2 1/2 hours of brisk physical activity every week.  Losing weight if necessary.  Not smoking.  Limiting alcoholic beverages.  Learning ways to reduce stress. If lifestyle changes are not enough to get your blood pressure under control, your health care provider may prescribe medicine. You may need to take more than one. Work closely with your health care provider to understand the risks and benefits. HOME CARE INSTRUCTIONS  Have your blood pressure rechecked as directed by your health care provider.   Only take medicine as directed by your health care provider. Follow the directions carefully. Blood pressure medicines must be taken as prescribed. The medicine does not work as well when you skip doses. Skipping doses also puts you at risk for problems.   Do not smoke.   Monitor your blood pressure at home as directed by your health care provider. SEEK MEDICAL CARE IF:   You think you are having a reaction to medicines taken.  You have recurrent headaches or feel dizzy.  You have swelling in your ankles.  You have trouble with your vision. SEEK IMMEDIATE MEDICAL CARE IF:  You develop a severe headache or   confusion.  You have unusual weakness, numbness, or feel faint.  You have severe chest or abdominal pain.  You vomit repeatedly.  You have trouble breathing. MAKE SURE YOU:   Understand these instructions.  Will watch your condition.  Will get help right away if you are not doing well or get  worse. Document Released: 03/30/2005 Document Revised: 04/04/2013 Document Reviewed: 01/20/2013 [employer] Patient Information 2015 [employer]. This information is not intended to replace advice given to you by your health care provider. Make sure you discuss any questions you have with your health care provider.  

## 2013-11-20 ENCOUNTER — Encounter: Payer: Self-pay | Admitting: Podiatry

## 2013-11-20 ENCOUNTER — Ambulatory Visit: Payer: Medicare Other | Admitting: Podiatry

## 2013-11-20 VITALS — BP 130/70 | HR 78 | Resp 12

## 2013-11-20 DIAGNOSIS — I2510 Atherosclerotic heart disease of native coronary artery without angina pectoris: Secondary | ICD-10-CM

## 2013-11-20 DIAGNOSIS — L600 Ingrowing nail: Secondary | ICD-10-CM | POA: Diagnosis not present

## 2013-11-20 NOTE — Progress Notes (Signed)
Subjective:     Patient ID: Vanessa Proctor, female   DOB: May 15, 1955, 58 y.o.   MRN: [id number]  HPI patient presents with severely painful left hallux nail with damaged the nailplate and inability to wear shoes or sleep with covers over it at nighttime   Review of Systems  All other systems reviewed and are negative.      Objective:   Physical Exam  Nursing note and vitals reviewed. Constitutional: She is oriented to person, place, and time.  Cardiovascular: Intact distal pulses.   Musculoskeletal: Normal range of motion.  Neurological: She is oriented to person, place, and time.  Skin: Skin is warm.   neurovascular status intact with muscle strength adequate and range of motion subtalar midtarsal joint within normal limits. Patient's found to have a severely damaged left hallux nail plate with lifting in the center and incurvation of the medial and lateral side with pain when I pressed from a dorsal direction. Patient's found to have well-perfused digits in adequate arch height and is noted to be well oriented x3     Assessment:     Chronic nail disease left with pain    Plan:     Reviewed condition and discussed options and at this point I recommended permanently removal of the nailbed explaining a nail will not regrow. Patient wants this procedure and today I infiltrated the left hallux 60 mg Xylocaine Marcaine mixture remove the hallux nail exposed matrix and applied phenol 5 applications 30 seconds followed by alcohol lavaged and sterile dressing. Instructed on soaks and reappoint

## 2013-11-20 NOTE — Progress Notes (Signed)
   Subjective:    Patient ID: Vanessa Proctor, female    DOB: [birth year], 58 y.o.   MRN: [id number]  HPI  PT STATED LT FOOT GREAT TOENAIL IS SORE FOR 7 MONTHS. THE TOENAIL IS GETTING BETTER. THE TOENAIL GET AGGRAVATED BY PRESSURE AND CERTAIN TYPE OF SHOES. THE DR. PRESCRIBE DOXYCYCLINE FOR INFECTION AND IT HELPS.   Review of Systems  Constitutional: Positive for fever, appetite change and fatigue.  HENT: Positive for sinus pressure.   Respiratory: Positive for cough and shortness of breath.   Gastrointestinal: Positive for nausea, vomiting, diarrhea and constipation.  Musculoskeletal: Positive for back pain, gait problem and joint swelling.  Neurological: Positive for numbness.       Objective:   Physical Exam        Assessment & Plan:

## 2013-11-20 NOTE — Patient Instructions (Signed)

## 2013-11-22 ENCOUNTER — Encounter (HOSPITAL_COMMUNITY): Payer: Self-pay | Admitting: Emergency Medicine

## 2013-11-22 ENCOUNTER — Emergency Department (INDEPENDENT_AMBULATORY_CARE_PROVIDER_SITE_OTHER)
Admission: EM | Admit: 2013-11-22 | Discharge: 2013-11-22 | Disposition: A | Discharge status: Home / Self Care | Payer: Medicare Other | Source: Home / Self Care

## 2013-11-22 DIAGNOSIS — R1909 Other intra-abdominal and pelvic swelling, mass and lump: Secondary | ICD-10-CM | POA: Diagnosis not present

## 2013-11-22 NOTE — Discharge Instructions (Signed)
Keep appointment for next week. Doubt this is a dangerous type of lump. May need to be biopsied.

## 2013-11-22 NOTE — ED Provider Notes (Signed)
CSN: [id number]     Arrival date & time 11/22/13  0957 History   First MD Initiated Contact with Patient 11/22/13 1050     Chief Complaint  Patient presents with  . Abscess   (Consider location/radiation/quality/duration/timing/severity/associated sxs/prior Treatment) HPI Comments: 58 year old female is concerned about a lump in the right groin that started 5 years ago. He started out as a very small soft lesion at that time. She noticed in the past few days it has increased in size. It is not painful or tender and less manipulated vigorously. It is located in the right inguinal fold adjacent to but not including the labia majora. She has mild discomfort associated with the "lump" when lying supine.   Past Medical History  Diagnosis Date  . Hypertension   . Depression   . Myocardial infarction     november 1st 2012  . Coronary artery disease   . Stroke     november, 2012  . Arthritis     knees  . Nausea & vomiting last month  . Constipation last month  . Rectal bleeding    Past Surgical History  Procedure Laterality Date  . Abdominal hysterectomy  1985    partial  . Coronary angioplasty with stent placement  2012    x 1  . Tonsillectomy  1980  . Knot drained and removed from side of face  2007  . Finger fracture surgery Left     reduction with  splint  . Colonoscopy N/A 04/28/2013    Procedure: COLONOSCOPY;  Surgeon: Beryle Beams, MD;  Location: [facility];  Service: Endoscopy;  Laterality: N/A;   Family History  Problem Relation Age of Onset  . Coronary artery disease    . Cancer Mother     mass in abdomin  . Pancreatic cancer Sister   . Stroke Brother    History  Substance Use Topics  . Smoking status: Current Every Day Smoker -- 0.25 packs/day for 40 years    Types: Cigarettes  . Smokeless tobacco: Never Used  . Alcohol Use: No     Comment: quit drugs 2011  marjuana   OB History   Grav Para Term Preterm Abortions TAB SAB Ect Mult Living                  Review of Systems  Constitutional: Negative.   Genitourinary: Negative.   All other systems reviewed and are negative.   Allergies  Bactrim and Sulfa antibiotics  Home Medications   Prior to Admission medications   Medication Sig Start Date End Date Taking? Authorizing Provider  hydrochlorothiazide (HYDRODIURIL) 25 MG tablet 1/2 tablet (total 12.5mg ) daily 04/24/13  Yes Larey Dresser, MD  metoprolol tartrate (LOPRESSOR) 25 MG tablet Take 1 tablet (25 mg total) by mouth 2 (two) times daily. 04/24/13  Yes Larey Dresser, MD  aspirin 81 MG chewable tablet Chew 81 mg by mouth daily.     Historical Provider, MD  atorvastatin (LIPITOR) 80 MG tablet Take 0.5 tablets (40 mg total) by mouth daily. 04/24/13   Larey Dresser, MD  clopidogrel (PLAVIX) 75 MG tablet Take 1 tablet (75 mg total) by mouth daily with breakfast. 04/24/13   Larey Dresser, MD  docusate sodium (COLACE) 100 MG capsule Take 100 mg by mouth 2 (two) times daily. 03/22/13   Reyne Dumas, MD  doxycycline (VIBRA-TABS) 100 MG tablet Take 1 tablet (100 mg total) by mouth 2 (two) times daily. 10/26/13   Theodis Blaze,  MD  hydrOXYzine (ATARAX/VISTARIL) 25 MG tablet Take 25 mg by mouth every 8 (eight) hours as needed for anxiety.     Historical Provider, MD  hydrOXYzine (VISTARIL) 25 MG capsule  09/21/13   Historical Provider, MD  losartan (COZAAR) 100 MG tablet Take 1 tablet (100 mg total) by mouth daily. 08/23/13   Larey Dresser, MD  nicotine (NICODERM CQ - DOSED IN MG/24 HOURS) 14 mg/24hr patch Place 1 patch onto the skin as needed.  03/28/12   Larey Dresser, MD  nitroGLYCERIN (NITROSTAT) 0.4 MG SL tablet Place 0.4 mg under the tongue every 5 (five) minutes as needed for chest pain.     Historical Provider, MD  polyethylene glycol powder (GLYCOLAX/MIRALAX) powder Take 17 g by mouth 2 (two) times daily as needed. 03/22/13   Reyne Dumas, MD  traMADol (ULTRAM) 50 MG tablet Take 1 tablet (50 mg total) by mouth every 8 (eight) hours  as needed for moderate pain or severe pain. 10/26/13   Theodis Blaze, MD   BP 143/74  Pulse 78  Temp(Src) 99.3 F (37.4 C) (Oral)  Resp 78  SpO2 99% Physical Exam  Nursing note and vitals reviewed. Constitutional: She is oriented to person, place, and time. She appears well-developed and well-nourished. No distress.  Pulmonary/Chest: Effort normal. No respiratory distress.  Neurological: She is alert and oriented to person, place, and time.  Skin: Skin is warm and dry.  The area is a 3-4 cm elongated relatively soft mass located in the right inguinal fold adjacent to the right labia majora. There are no overlying skin changes or discoloration. No erythema, increased warmth, opening or signs of drainage. Minimal tenderness with deep palpation. It is partially mobile  and the underlying tendons are palpable. No pulsations. Does not feel fluctuant. It is not tense.    ED Course  Procedures (including critical care time) Labs Review Labs Reviewed - No data to display  Imaging Review No results found.   MDM   1. Mass of right inguinal region     Mass of unknown origin. Possibly a lymph node. Very slow growing. No associated sx's.  Keep appt with PCP next week.    Janne Napoleon, NP 11/22/13 [phone]

## 2013-11-22 NOTE — ED Notes (Signed)
Pt c/o abscess on groin area since Sunday Increases w/pressure Denies drainage  Alert w/no signs of acute distress; ambulated well to exam room

## 2013-11-22 NOTE — ED Provider Notes (Signed)
Medical screening examination/treatment/procedure(s) were performed by a resident physician or non-physician practitioner and as the supervising physician I was immediately available for consultation/collaboration.  Lynne Leader, MD    Gregor Hams, MD 11/22/13 [phone]

## 2013-11-30 ENCOUNTER — Other Ambulatory Visit (HOSPITAL_COMMUNITY)
Admission: RE | Admit: 2013-11-30 | Discharge: 2013-11-30 | Disposition: A | Discharge status: Home / Self Care | Payer: Medicare Other | Source: Ambulatory Visit | Attending: Internal Medicine | Admitting: Internal Medicine

## 2013-11-30 ENCOUNTER — Encounter: Payer: Self-pay | Admitting: Internal Medicine

## 2013-11-30 ENCOUNTER — Ambulatory Visit: Payer: Medicare Other | Attending: Internal Medicine | Admitting: Internal Medicine

## 2013-11-30 VITALS — BP 150/90 | HR 83 | Temp 97.7°F | Resp 18 | Ht 61.0 in | Wt 167.0 lb

## 2013-11-30 DIAGNOSIS — R87810 Cervical high risk human papillomavirus (HPV) DNA test positive: Secondary | ICD-10-CM | POA: Insufficient documentation

## 2013-11-30 DIAGNOSIS — R7309 Other abnormal glucose: Secondary | ICD-10-CM

## 2013-11-30 DIAGNOSIS — Z8673 Personal history of transient ischemic attack (TIA), and cerebral infarction without residual deficits: Secondary | ICD-10-CM | POA: Diagnosis not present

## 2013-11-30 DIAGNOSIS — F3289 Other specified depressive episodes: Secondary | ICD-10-CM | POA: Diagnosis not present

## 2013-11-30 DIAGNOSIS — I2510 Atherosclerotic heart disease of native coronary artery without angina pectoris: Secondary | ICD-10-CM | POA: Diagnosis not present

## 2013-11-30 DIAGNOSIS — Z113 Encounter for screening for infections with a predominantly sexual mode of transmission: Secondary | ICD-10-CM | POA: Diagnosis not present

## 2013-11-30 DIAGNOSIS — Z7982 Long term (current) use of aspirin: Secondary | ICD-10-CM | POA: Diagnosis not present

## 2013-11-30 DIAGNOSIS — I252 Old myocardial infarction: Secondary | ICD-10-CM | POA: Diagnosis not present

## 2013-11-30 DIAGNOSIS — IMO0002 Reserved for concepts with insufficient information to code with codable children: Secondary | ICD-10-CM

## 2013-11-30 DIAGNOSIS — F329 Major depressive disorder, single episode, unspecified: Secondary | ICD-10-CM | POA: Insufficient documentation

## 2013-11-30 DIAGNOSIS — Z124 Encounter for screening for malignant neoplasm of cervix: Secondary | ICD-10-CM | POA: Diagnosis not present

## 2013-11-30 DIAGNOSIS — M1710 Unilateral primary osteoarthritis, unspecified knee: Secondary | ICD-10-CM | POA: Diagnosis not present

## 2013-11-30 DIAGNOSIS — Z1151 Encounter for screening for human papillomavirus (HPV): Secondary | ICD-10-CM | POA: Diagnosis not present

## 2013-11-30 DIAGNOSIS — N760 Acute vaginitis: Secondary | ICD-10-CM | POA: Insufficient documentation

## 2013-11-30 DIAGNOSIS — I10 Essential (primary) hypertension: Secondary | ICD-10-CM | POA: Diagnosis not present

## 2013-11-30 DIAGNOSIS — Z882 Allergy status to sulfonamides status: Secondary | ICD-10-CM | POA: Insufficient documentation

## 2013-11-30 DIAGNOSIS — R7303 Prediabetes: Secondary | ICD-10-CM

## 2013-11-30 LAB — LIPID PANEL
Cholesterol: 122 mg/dL (ref 0–200)
HDL: 42 mg/dL (ref >39)
LDL Cholesterol: 56 mg/dL (ref 0–99)
Total CHOL/HDL Ratio: 2.9 Ratio
Triglycerides: 122 mg/dL (ref <150)
VLDL: 24 mg/dL (ref 0–40)

## 2013-11-30 LAB — TSH: TSH: 0.852 u[IU]/mL (ref 0.350–4.500)

## 2013-11-30 LAB — POCT GLYCOSYLATED HEMOGLOBIN (HGB A1C): Hemoglobin A1C: 6

## 2013-11-30 NOTE — Patient Instructions (Signed)

## 2013-11-30 NOTE — Progress Notes (Signed)
Patient ID: Vanessa Proctor, female   DOB: 1955/11/22, 59 y.o.   MRN: [id number]   Vanessa Proctor, is a 58 y.o. female  YQI:[id number]  [id number]:[id number]  DOB - February 20, 1956  Chief Complaint  Patient presents with  . Follow-up  . Lipoma        Subjective:   Vanessa Proctor is a 58 y.o. female here today for a follow up visit. Patient has history of hypertension, depression, myocardial infarction in 2012 (she had NSTEMI with PCI to RCA with DES in 11/12).A few weeks after the NSTEMI, she had a CVA. EF was preserved on echo. Since that time she has been relatively stable symptomatically. Pt comes in for f/u lump in right groin area with increase swelling and now discomfort. States lump appeared 4 yrs ago without swelling, States pain worsens with lying down Pt also concerned about weight loss without diet/exercise. Last weight 7/16 170 lbs/ 167 today. She denies any fever or chills, no bleeding per rectal, no blood in stool, no postmenopausal bleeding. Patient has No headache, No chest pain, No abdominal pain - No Nausea, No new weakness tingling or numbness, No Cough - SOB.  Problem  Pap Smear for Cervical Cancer Screening  Prediabetes    ALLERGIES: Allergies  Allergen Reactions  . Bactrim Rash  . Sulfa Antibiotics Rash    PAST MEDICAL HISTORY: Past Medical History  Diagnosis Date  . Hypertension   . Depression   . Myocardial infarction     november 1st 2012  . Coronary artery disease   . Stroke     november, 2012  . Arthritis     knees  . Nausea & vomiting last month  . Constipation last month  . Rectal bleeding     MEDICATIONS AT HOME: Prior to Admission medications   Medication Sig Start Date End Date Taking? Authorizing Provider  aspirin 81 MG chewable tablet Chew 81 mg by mouth daily.    Yes Historical Provider, MD  atorvastatin (LIPITOR) 80 MG tablet Take 0.5 tablets (40 mg total) by mouth daily. 04/24/13  Yes Larey Dresser, MD  clopidogrel (PLAVIX) 75 MG  tablet Take 1 tablet (75 mg total) by mouth daily with breakfast. 04/24/13  Yes Larey Dresser, MD  docusate sodium (COLACE) 100 MG capsule Take 100 mg by mouth 2 (two) times daily. 03/22/13  Yes Reyne Dumas, MD  hydrochlorothiazide (HYDRODIURIL) 25 MG tablet 1/2 tablet (total 12.5mg ) daily 04/24/13  Yes Larey Dresser, MD  losartan (COZAAR) 100 MG tablet Take 1 tablet (100 mg total) by mouth daily. 08/23/13  Yes Larey Dresser, MD  metoprolol tartrate (LOPRESSOR) 25 MG tablet Take 1 tablet (25 mg total) by mouth 2 (two) times daily. 04/24/13  Yes Larey Dresser, MD  nitroGLYCERIN (NITROSTAT) 0.4 MG SL tablet Place 0.4 mg under the tongue every 5 (five) minutes as needed for chest pain.    Yes Historical Provider, MD  traMADol (ULTRAM) 50 MG tablet Take 1 tablet (50 mg total) by mouth every 8 (eight) hours as needed for moderate pain or severe pain. 10/26/13  Yes Theodis Blaze, MD  doxycycline (VIBRA-TABS) 100 MG tablet Take 1 tablet (100 mg total) by mouth 2 (two) times daily. 10/26/13   Theodis Blaze, MD  hydrOXYzine (ATARAX/VISTARIL) 25 MG tablet Take 25 mg by mouth every 8 (eight) hours as needed for anxiety.     Historical Provider, MD  hydrOXYzine (VISTARIL) 25 MG capsule  09/21/13   Historical Provider, MD  nicotine (NICODERM CQ - DOSED IN MG/24 HOURS) 14 mg/24hr patch Place 1 patch onto the skin as needed.  03/28/12   Larey Dresser, MD  polyethylene glycol powder (GLYCOLAX/MIRALAX) powder Take 17 g by mouth 2 (two) times daily as needed. 03/22/13   Reyne Dumas, MD     Objective:   Filed Vitals:   11/30/13 1048  BP: 150/90  Pulse: 83  Temp: 97.7 F (36.5 C)  TempSrc: Oral  Resp: 18  Height: 5\' 1"  (1.549 m)  Weight: 75.751 kg (167 lb)  SpO2: 99%    Exam General appearance : Awake, alert, not in any distress. Speech Clear. Not toxic looking HEENT: Atraumatic and Normocephalic, pupils equally reactive to light and accomodation Neck: supple, no JVD. No cervical lymphadenopathy.    Chest:Good air entry bilaterally, no added sounds  CVS: S1 S2 regular, no murmurs.  Abdomen: Bowel sounds present, Non tender and not distended with no gaurding, rigidity or rebound. Extremities: B/L Lower Ext shows no edema, both legs are warm to touch Neurology: Awake alert, and oriented X 3, CN II-XII intact, Non focal Pelvic examination: Normal female external genitalia, no obvious asymmetry, right  labia majora with a lump that she is slightly thickened cord, minimally tender, no redness. Data Review Lab Results  Component Value Date   HGBA1C 6.1* 03/09/2011   HGBA1C 6.1* 02/13/2011   Lab Results  Component Value Date   HGBA1C 6.0 11/30/2013   HGBA1C 6.1* 03/09/2011   HGBA1C 6.1* 02/13/2011      Assessment & Plan   1. Essential hypertension, benign Continue metoprolol and hydrochlorothiazide Were discussed blood pressure goal, patient will try nutritional control with current medication before changing  - Urinalysis, Complete - Lipid panel - TSH  2. Pap smear for cervical cancer screening  - Cytology - PAP - Cervicovaginal ancillary only - Ambulatory referral to Gynecology for lump around the right labia majora, feels more like a thickened cord  3. Prediabetes  - POCT glycosylated hemoglobin (Hb A1C) is 6.0% today  Patient was counseled extensively about nutrition and exercise  Return in about 3 months (around 03/02/2014), or if symptoms worsen or fail to improve, for Follow up HTN, Follow up Pain and comorbidities.  The patient was given clear instructions to go to ER or return to medical center if symptoms don't improve, worsen or new problems develop. The patient verbalized understanding. The patient was told to call to get lab results if they haven't heard anything in the next week.   This note has been created with Surveyor, quantity. Any transcriptional errors are unintentional.    Vanessa Chessman, MD, [place],  [place], [place] and [facility] [place], [place]   11/30/2013, 11:43 AM

## 2013-11-30 NOTE — Progress Notes (Signed)
Pt comes in for f/u lump in right groin area with increase swelling and now discomfort States lump appeared 4 yrs ago without swelling States pain worsens with lying down Pt also concerned about weight loss without diet/exercise Last weight 7/16 170 lbs/ 167 today

## 2013-12-01 LAB — URINALYSIS, COMPLETE
Bacteria, UA: NONE SEEN
Bilirubin Urine: NEGATIVE
Casts: NONE SEEN
Crystals: NONE SEEN
Glucose, UA: NEGATIVE mg/dL
Hgb urine dipstick: NEGATIVE
Ketones, ur: NEGATIVE mg/dL
Leukocytes, UA: NEGATIVE
Nitrite: NEGATIVE
Protein, ur: NEGATIVE mg/dL
Specific Gravity, Urine: 1.009 (ref 1.005–1.030)
Urobilinogen, UA: 1 mg/dL (ref 0.0–1.0)
pH: 7 (ref 5.0–8.0)

## 2013-12-05 LAB — CYTOLOGY - PAP

## 2013-12-06 ENCOUNTER — Encounter: Payer: Self-pay | Admitting: Obstetrics & Gynecology

## 2013-12-06 ENCOUNTER — Telehealth: Payer: Self-pay | Admitting: Emergency Medicine

## 2013-12-06 NOTE — Telephone Encounter (Signed)
Pt given pap smear results

## 2013-12-06 NOTE — Telephone Encounter (Signed)
Message copied by Ricci Barker on Wed Dec 06, 2013  2:49 PM ------      Message from: Angelica Chessman E      Created: Wed Dec 06, 2013 10:53 AM       Please Inform patient that her Pap smear result is negative for malignancy ------

## 2014-01-01 ENCOUNTER — Encounter: Payer: Self-pay | Admitting: Podiatry

## 2014-01-01 ENCOUNTER — Ambulatory Visit (INDEPENDENT_AMBULATORY_CARE_PROVIDER_SITE_OTHER): Payer: Medicare Other | Admitting: Podiatry

## 2014-01-01 ENCOUNTER — Telehealth: Payer: Self-pay | Admitting: *Deleted

## 2014-01-01 VITALS — BP 79/47 | HR 63 | Resp 18

## 2014-01-01 DIAGNOSIS — L03039 Cellulitis of unspecified toe: Secondary | ICD-10-CM | POA: Diagnosis not present

## 2014-01-01 DIAGNOSIS — I2510 Atherosclerotic heart disease of native coronary artery without angina pectoris: Secondary | ICD-10-CM | POA: Diagnosis not present

## 2014-01-01 NOTE — Telephone Encounter (Signed)
I'm just calling in reference to my great big toe.  I had a toenail removed on the 10th of August.  I'm not sure as to whether or not it's healing properly.  I just want to know if there is any way I can get in just to have the doctor or the nurse to look at it, to see if there's anything else I can put on it.  I must admit it has been stepped on a couple of times.  I just want somebody to look at it if at all possible.  I am now using vinegar twice a day to soak the toe.  If you could call me back, I'd greatly appreciate it.  Thank you.  I returned her call.  She stated,  I just want someone to look at my toe.  It's still hurting.  I'm soaking it in Vinegar, that is what it said on that sheet that I could do."  I told her I was going to transfer her to a scheduler.

## 2014-01-02 NOTE — Progress Notes (Signed)
Subjective:     Patient ID: Vanessa Proctor, female   DOB: 04-Apr-1956, 58 y.o.   MRN: [id number]  HPI patient presents stating that I wanted to get my big toenail that was removed left checked in the bed as it still is sore and draining   Review of Systems     Objective:   Physical Exam Neurovascular status unchanged with a healing nailbed left hallux after removal of the nail approximately 1 month ago    Assessment:     Localized irritation of nail bed is patient has been using creams and not allowing it to air dry    Plan:     Does appear to be healing but I gave instructions if any continued drainage were to occur or proximal erythema edema then we will place her on an antibiotic. Today I changed to dry dressings and allowing it to air dry and I explained it should heal uneventfully in the next several weeks

## 2014-01-15 ENCOUNTER — Encounter: Payer: Self-pay | Admitting: Obstetrics & Gynecology

## 2014-01-15 ENCOUNTER — Ambulatory Visit (INDEPENDENT_AMBULATORY_CARE_PROVIDER_SITE_OTHER): Payer: Medicare Other | Admitting: Obstetrics & Gynecology

## 2014-01-15 VITALS — BP 129/85 | HR 92 | Ht 61.0 in | Wt 169.7 lb

## 2014-01-15 DIAGNOSIS — R599 Enlarged lymph nodes, unspecified: Secondary | ICD-10-CM

## 2014-01-15 DIAGNOSIS — I2510 Atherosclerotic heart disease of native coronary artery without angina pectoris: Secondary | ICD-10-CM

## 2014-01-15 DIAGNOSIS — R590 Localized enlarged lymph nodes: Secondary | ICD-10-CM

## 2014-01-15 NOTE — Patient Instructions (Signed)

## 2014-01-15 NOTE — Progress Notes (Signed)
Pt states that she is here for evaluation of a lump in her groin area. It has been there for several years. She feels like it has gotten larger.

## 2014-01-15 NOTE — Progress Notes (Signed)
Patient ID: Vanessa Proctor, female   DOB: 13-Jan-1956, 58 y.o.   MRN: [id number]  Chief Complaint  Patient presents with  . Lump In Groin    HPI Vanessa Proctor is a 58 y.o. female.  [id number] S/p TAH BSO 1985, with r groin mass for years she says is enlarging, minimal discomfort  HPI  Past Medical History  Diagnosis Date  . Hypertension   . Depression   . Myocardial infarction     november 1st 2012  . Coronary artery disease   . Stroke     november, 2012  . Arthritis     knees  . Nausea & vomiting last month  . Constipation last month  . Rectal bleeding     Past Surgical History  Procedure Laterality Date  . Abdominal hysterectomy  1985    partial  . Coronary angioplasty with stent placement  2012    x 1  . Tonsillectomy  1980  . Knot drained and removed from side of face  2007  . Finger fracture surgery Left     reduction with  splint  . Colonoscopy N/A 04/28/2013    Procedure: COLONOSCOPY;  Surgeon: Beryle Beams, MD;  Location: [facility];  Service: Endoscopy;  Laterality: N/A;    Family History  Problem Relation Age of Onset  . Coronary artery disease    . Cancer Mother     mass in abdomin  . Pancreatic cancer Sister   . Stroke Brother     Social History History  Substance Use Topics  . Smoking status: Current Every Day Smoker -- 0.25 packs/day for 40 years    Types: Cigarettes  . Smokeless tobacco: Never Used  . Alcohol Use: No     Comment: quit drugs 2011  marjuana    Allergies  Allergen Reactions  . Bactrim Rash  . Sulfa Antibiotics Rash    Current Outpatient Prescriptions  Medication Sig Dispense Refill  . aspirin 81 MG chewable tablet Chew 81 mg by mouth daily.       Marland Kitchen atorvastatin (LIPITOR) 80 MG tablet Take 0.5 tablets (40 mg total) by mouth daily.  30 tablet  6  . clopidogrel (PLAVIX) 75 MG tablet Take 1 tablet (75 mg total) by mouth daily with breakfast.  30 tablet  6  . docusate sodium (COLACE) 100 MG capsule Take 100 mg by  mouth 2 (two) times daily.      . hydrochlorothiazide (HYDRODIURIL) 25 MG tablet 1/2 tablet (total 12.5mg ) daily  15 tablet  6  . hydrOXYzine (ATARAX/VISTARIL) 25 MG tablet Take 25 mg by mouth every 8 (eight) hours as needed for anxiety.       . hydrOXYzine (VISTARIL) 25 MG capsule       . losartan (COZAAR) 100 MG tablet Take 1 tablet (100 mg total) by mouth daily.  30 tablet  6  . metoprolol tartrate (LOPRESSOR) 25 MG tablet Take 1 tablet (25 mg total) by mouth 2 (two) times daily.  60 tablet  6  . nicotine (NICODERM CQ - DOSED IN MG/24 HOURS) 14 mg/24hr patch Place 1 patch onto the skin as needed.       . nitroGLYCERIN (NITROSTAT) 0.4 MG SL tablet Place 0.4 mg under the tongue every 5 (five) minutes as needed for chest pain.       . traMADol (ULTRAM) 50 MG tablet Take 1 tablet (50 mg total) by mouth every 8 (eight) hours as needed for moderate pain or severe pain.  90 tablet  3  . doxycycline (VIBRA-TABS) 100 MG tablet Take 1 tablet (100 mg total) by mouth 2 (two) times daily.  10 tablet  0  . polyethylene glycol powder (GLYCOLAX/MIRALAX) powder Take 17 g by mouth 2 (two) times daily as needed.  3350 g  1   No current facility-administered medications for this visit.    Review of Systems Review of Systems  Constitutional: Negative.   Respiratory: Negative.   Cardiovascular: Negative.   Genitourinary: Negative for pelvic pain.       Lump right groin    Blood pressure 129/85, pulse 92, height 5\' 1"  (1.549 m), weight 76.975 kg (169 lb 11.2 oz).  Physical Exam Physical Exam  Vitals reviewed. Constitutional: She is oriented to person, place, and time. She appears well-developed. No distress.  Pulmonary/Chest: Effort normal. No respiratory distress.  Abdominal: Soft. She exhibits no distension. There is no tenderness.  Genitourinary: Vagina normal. No vaginal discharge found.  1.5 x 2cm mobile r groin mass c/w lymph node, no vaginal or vulvar lesions  Neurological: She is alert and  oriented to person, place, and time.    Data Reviewed   Assessment    Right inguinal adenopathy     Plan    Return to PCP, genl surgery if removal to be considered. Has had hysterectomy and pap smears not advised       Hershal Eriksson 01/15/2014, 2:44 PM

## 2014-01-18 ENCOUNTER — Telehealth: Payer: Self-pay | Admitting: Internal Medicine

## 2014-01-18 ENCOUNTER — Telehealth: Payer: Self-pay | Admitting: Emergency Medicine

## 2014-01-18 NOTE — Telephone Encounter (Signed)
Pt has two upcoming scheduled appts, a lab visit on 01/22/14 and a f/u appt on 01/29/14. Pt was calling to ask if the appts can be consolidated or if its necessary to have two separate appts. There is no specification in AVS that pt needs two appts but pt would like to speak to clinician. Please f/u with pt.

## 2014-01-18 NOTE — Telephone Encounter (Signed)
Pt has two upcoming scheduled appts, a lab visit on 01/22/14 and a f/u appt on 01/29/14.  Pt was calling to ask if the appts can be consolidated or if its necessary to have two separate appts. There is no specification in AVS that pt needs two appts but pt would like to speak to clinician.  Please f/u with pt.

## 2014-01-18 NOTE — Telephone Encounter (Signed)
Left message that pt may combine blood work/OV together.

## 2014-01-22 ENCOUNTER — Other Ambulatory Visit: Payer: Self-pay

## 2014-01-23 NOTE — Telephone Encounter (Signed)
error 

## 2014-01-29 ENCOUNTER — Ambulatory Visit: Payer: Self-pay | Admitting: Internal Medicine

## 2014-01-29 ENCOUNTER — Other Ambulatory Visit: Payer: Self-pay

## 2014-01-31 ENCOUNTER — Other Ambulatory Visit: Payer: Self-pay | Admitting: Cardiology

## 2014-02-05 ENCOUNTER — Encounter: Payer: Self-pay | Admitting: *Deleted

## 2014-02-05 ENCOUNTER — Ambulatory Visit (INDEPENDENT_AMBULATORY_CARE_PROVIDER_SITE_OTHER): Payer: Medicare Other | Admitting: Cardiology

## 2014-02-05 ENCOUNTER — Encounter: Payer: Self-pay | Admitting: Cardiology

## 2014-02-05 VITALS — BP 140/82 | HR 63 | Ht 61.0 in | Wt 172.0 lb

## 2014-02-05 DIAGNOSIS — I2510 Atherosclerotic heart disease of native coronary artery without angina pectoris: Secondary | ICD-10-CM | POA: Diagnosis not present

## 2014-02-05 DIAGNOSIS — R0989 Other specified symptoms and signs involving the circulatory and respiratory systems: Secondary | ICD-10-CM

## 2014-02-05 DIAGNOSIS — I10 Essential (primary) hypertension: Secondary | ICD-10-CM

## 2014-02-05 MED ORDER — HYDROCHLOROTHIAZIDE 25 MG PO TABS: 25.0000 mg | ORAL_TABLET | Freq: Every day | ORAL | Status: DC

## 2014-02-05 NOTE — Patient Instructions (Signed)
Increase HCTZ(hydrochlorothizide) to 25mg  daily.   Your physician recommends that you return for lab work in: 2 weeks--BMET.  Your physician has requested that you have a carotid duplex. This test is an ultrasound of the carotid arteries in your neck. It looks at blood flow through these arteries that supply the brain with blood. Allow one hour for this exam. There are no restrictions or special instructions.  Your physician wants you to follow-up in: 9 months with Dr Aundra Dubin. (July 2016). You will receive a reminder letter in the mail two months in advance. If you don't receive a letter, please call our office to schedule the follow-up appointment.

## 2014-02-05 NOTE — Progress Notes (Signed)
Patient ID: Vanessa Proctor, female   DOB: 1955-12-25, 58 y.o.   MRN: [id number] PCP: Dr. Doreene Burke  58 yo with history of CAD s/p NSTEMI and CVA presents for cardiology followup.  Vanessa Proctor had NSTEMI with PCI to RCA with DES in 11/12.  A few weeks after the NSTEMI, she had a CVA.  EF was preserved on echo.  Since that time, she has been symptomatically stable. She has atypical chest pain; it tends to be transient (1-2 seconds) and usually occurs when she is in bed.  She is chronically short of breath after walking about 2 blocks or walking up steps.  She smokes very rare cigarettes now.  She is wearing a nicotine patch.  She has not been doing much walking due to right heel pain.   Labs (11/12): K 3.9, creatinine 0.77 Labs (10/13): LDL 62, HDL 36, AST 47, ALT 43 Labs (11/13): AST 48, ALT 40 Labs (8/14): LDL 73, HDL 42 Labs (12/14): TSH normal, K 4.8, creatinine 0.78 Labs (4/15): K 4.3, creatinine 0.8 Labs (8/15): LDL 56, HDL 42  ECG: NSR, normal  PMH: 1. HTN 2. Depression 3. CAD: NSTEMI in 11/12.  LHC with EF 55-60%, basal inferior HK, 99% mRCA stenosis, 40% OM1.  Patient had Promus DES to RCA.   4. CVA: 11/12, right external capsule.  Thought to be due to small vessel disease.  Carotid dopplers showed no significant stenosis.  5. Echo (11/12): EF 65-70%, no significant valvular disease.  6. Elevated transaminases: Mild, with statin use.  7. ABIs (9/13): Normal.  8. Active smoker.  9. Colon polyps  SH: Unemployed, has 2 daughters.  Enrolled at [employer] currently.  Active smoker but only rare cigarettes at this point.   FH: Father with CHF.  ROS: All systems reviewed and negative except as per HPI.   Current Outpatient Prescriptions  Medication Sig Dispense Refill  . aspirin 81 MG chewable tablet Chew 81 mg by mouth daily.       Marland Kitchen atorvastatin (LIPITOR) 80 MG tablet Take 0.5 tablets (40 mg total) by mouth daily.  30 tablet  6  . clopidogrel (PLAVIX) 75 MG tablet Take 1 tablet (75  mg total) by mouth daily with breakfast.  30 tablet  6  . docusate sodium (COLACE) 100 MG capsule Take 100 mg by mouth 2 (two) times daily.      Marland Kitchen doxycycline (VIBRA-TABS) 100 MG tablet Take 1 tablet (100 mg total) by mouth 2 (two) times daily.  10 tablet  0  . hydrOXYzine (ATARAX/VISTARIL) 25 MG tablet Take 25 mg by mouth every 8 (eight) hours as needed for anxiety.       . hydrOXYzine (VISTARIL) 25 MG capsule       . losartan (COZAAR) 100 MG tablet Take 1 tablet (100 mg total) by mouth daily.  30 tablet  6  . metoprolol tartrate (LOPRESSOR) 25 MG tablet TAKE ONE TABLET BY MOUTH TWICE DAILY  60 tablet  0  . nicotine (NICODERM CQ - DOSED IN MG/24 HOURS) 14 mg/24hr patch Place 1 patch onto the skin as needed.       . nitroGLYCERIN (NITROSTAT) 0.4 MG SL tablet Place 0.4 mg under the tongue every 5 (five) minutes as needed for chest pain.       . polyethylene glycol powder (GLYCOLAX/MIRALAX) powder Take 17 g by mouth 2 (two) times daily as needed.  3350 g  1  . traMADol (ULTRAM) 50 MG tablet Take 1 tablet (50 mg total)  by mouth every 8 (eight) hours as needed for moderate pain or severe pain.  90 tablet  3  . hydrochlorothiazide (HYDRODIURIL) 25 MG tablet Take 1 tablet (25 mg total) by mouth daily.  30 tablet  3   No current facility-administered medications for this visit.    BP 140/82  Pulse 63  Ht 5\' 1"  (1.549 m)  Wt 172 lb (78.019 kg)  BMI 32.52 kg/m2 General: NAD Neck: No JVD, no thyromegaly or thyroid nodule.  Lungs: Clear to auscultation bilaterally with normal respiratory effort. CV: Nondisplaced PMI.  Heart regular S1/S2, no S3/S4, 1/6 SEM.  No edema.  Right carotid bruit. Normal pulses.  Feet are warm.   Abdomen: Soft, nontender, no hepatosplenomegaly, no distention.  Neurologic: Alert and oriented x 3.  Psych: Normal affect. Extremities: No clubbing or cyanosis.   Assessment/Plan 1. CAD: No ischemic symptoms. She will continue ASA 81, Plavix (especially with prior CVA),  metoprolol, and statin.   2. Hyperlipidemia: Good lipids on atorvastatin 40.   3. HTN: BP is borderline today, runs high at times at home.  I will increase HCTZ to 25 mg daily with BMET in 2 wks.  4. Smoking: She is using a nicotine patch and has mostly quit (smokes a cigarette rarely now).  I strongly encouraged her to stop entirely.  5. Carotid bruit: Right carotid bruit.  Will check carotid dopplers.  6. H/o CVA: Patient continues Plavix.    Loralie Champagne 02/05/2014

## 2014-02-07 NOTE — Addendum Note (Signed)
Addended by: Katrine Coho on: 02/07/2014 09:21 AM   Modules accepted: Orders

## 2014-02-09 ENCOUNTER — Emergency Department (HOSPITAL_COMMUNITY): Payer: Medicare Other

## 2014-02-09 ENCOUNTER — Encounter (HOSPITAL_COMMUNITY): Payer: Self-pay | Admitting: Emergency Medicine

## 2014-02-09 ENCOUNTER — Telehealth: Payer: Self-pay | Admitting: Cardiology

## 2014-02-09 ENCOUNTER — Emergency Department (HOSPITAL_COMMUNITY)
Admission: EM | Admit: 2014-02-09 | Discharge: 2014-02-09 | Disposition: A | Discharge status: Home / Self Care | Payer: Medicare Other | Attending: Emergency Medicine | Admitting: Emergency Medicine

## 2014-02-09 DIAGNOSIS — Z79899 Other long term (current) drug therapy: Secondary | ICD-10-CM | POA: Insufficient documentation

## 2014-02-09 DIAGNOSIS — Z7902 Long term (current) use of antithrombotics/antiplatelets: Secondary | ICD-10-CM | POA: Diagnosis not present

## 2014-02-09 DIAGNOSIS — Z8673 Personal history of transient ischemic attack (TIA), and cerebral infarction without residual deficits: Secondary | ICD-10-CM | POA: Insufficient documentation

## 2014-02-09 DIAGNOSIS — M25572 Pain in left ankle and joints of left foot: Secondary | ICD-10-CM | POA: Diagnosis not present

## 2014-02-09 DIAGNOSIS — W108XXA Fall (on) (from) other stairs and steps, initial encounter: Secondary | ICD-10-CM | POA: Insufficient documentation

## 2014-02-09 DIAGNOSIS — M7989 Other specified soft tissue disorders: Secondary | ICD-10-CM | POA: Diagnosis not present

## 2014-02-09 DIAGNOSIS — Y9301 Activity, walking, marching and hiking: Secondary | ICD-10-CM | POA: Diagnosis not present

## 2014-02-09 DIAGNOSIS — S99912A Unspecified injury of left ankle, initial encounter: Secondary | ICD-10-CM | POA: Diagnosis not present

## 2014-02-09 DIAGNOSIS — S93402A Sprain of unspecified ligament of left ankle, initial encounter: Secondary | ICD-10-CM | POA: Diagnosis not present

## 2014-02-09 DIAGNOSIS — Y9289 Other specified places as the place of occurrence of the external cause: Secondary | ICD-10-CM | POA: Insufficient documentation

## 2014-02-09 DIAGNOSIS — M25579 Pain in unspecified ankle and joints of unspecified foot: Secondary | ICD-10-CM

## 2014-02-09 DIAGNOSIS — Z7982 Long term (current) use of aspirin: Secondary | ICD-10-CM | POA: Diagnosis not present

## 2014-02-09 DIAGNOSIS — I2510 Atherosclerotic heart disease of native coronary artery without angina pectoris: Secondary | ICD-10-CM | POA: Diagnosis not present

## 2014-02-09 DIAGNOSIS — Z9861 Coronary angioplasty status: Secondary | ICD-10-CM | POA: Insufficient documentation

## 2014-02-09 DIAGNOSIS — Z720 Tobacco use: Secondary | ICD-10-CM | POA: Insufficient documentation

## 2014-02-09 DIAGNOSIS — M79672 Pain in left foot: Secondary | ICD-10-CM | POA: Diagnosis not present

## 2014-02-09 DIAGNOSIS — X58XXXA Exposure to other specified factors, initial encounter: Secondary | ICD-10-CM | POA: Diagnosis not present

## 2014-02-09 DIAGNOSIS — I252 Old myocardial infarction: Secondary | ICD-10-CM | POA: Diagnosis not present

## 2014-02-09 DIAGNOSIS — M13869 Other specified arthritis, unspecified knee: Secondary | ICD-10-CM | POA: Insufficient documentation

## 2014-02-09 DIAGNOSIS — K5900 Constipation, unspecified: Secondary | ICD-10-CM | POA: Diagnosis not present

## 2014-02-09 DIAGNOSIS — Z792 Long term (current) use of antibiotics: Secondary | ICD-10-CM | POA: Diagnosis not present

## 2014-02-09 DIAGNOSIS — W19XXXA Unspecified fall, initial encounter: Secondary | ICD-10-CM

## 2014-02-09 DIAGNOSIS — F329 Major depressive disorder, single episode, unspecified: Secondary | ICD-10-CM | POA: Diagnosis not present

## 2014-02-09 DIAGNOSIS — I10 Essential (primary) hypertension: Secondary | ICD-10-CM | POA: Diagnosis not present

## 2014-02-09 MED ORDER — HYDROCODONE-ACETAMINOPHEN 5-325 MG PO TABS: 1.0000 | ORAL_TABLET | Freq: Four times a day (QID) | ORAL | Status: DC | PRN

## 2014-02-09 NOTE — ED Notes (Signed)
Pt reports tripping when walking down steps today, only fell one step but twisted left foot and has pain to the top of her foot which is causing difficulty walking.

## 2014-02-09 NOTE — Discharge Instructions (Signed)

## 2014-02-09 NOTE — Telephone Encounter (Signed)
Walk In Pt Form " [place] back 11/4 will present to her then 10.30.15/km

## 2014-02-09 NOTE — ED Provider Notes (Signed)
CSN: [id number]     Arrival date & time 02/09/14  1404 History  This chart was scribed for non-physician practitioner, Lorre Munroe, PA-C,working with Richarda Blade, MD, by Marlowe Kays, ED Scribe. This patient was seen in room TR06C/TR06C and the patient's care was started at 3:30 PM.  Chief Complaint  Patient presents with  . Foot Pain   The history is provided by the patient. No language interpreter was used.   HPI Comments:  Vanessa Proctor is a 58 y.o. female who presents to the Emergency Department complaining of a left foot injury she sustained by tripping down one step and twisting the foot a few hours ago. She reports severe pain to the dorsal left foot. She has not taken anything to alleviate her symptoms. Her symptoms are aggravated with palpation and movement. She is able to ambulate, though it is painful.   Past Medical History  Diagnosis Date  . Hypertension   . Depression   . Myocardial infarction     november 1st 2012  . Coronary artery disease   . Stroke     november, 2012  . Arthritis     knees  . Nausea & vomiting last month  . Constipation last month  . Rectal bleeding    Past Surgical History  Procedure Laterality Date  . Abdominal hysterectomy  1985    partial  . Coronary angioplasty with stent placement  2012    x 1  . Tonsillectomy  1980  . Knot drained and removed from side of face  2007  . Finger fracture surgery Left     reduction with  splint  . Colonoscopy N/A 04/28/2013    Procedure: COLONOSCOPY;  Surgeon: Beryle Beams, MD;  Location: [facility];  Service: Endoscopy;  Laterality: N/A;   Family History  Problem Relation Age of Onset  . Coronary artery disease    . Cancer Mother     mass in abdomin  . Pancreatic cancer Sister   . Stroke Brother    History  Substance Use Topics  . Smoking status: Current Every Day Smoker -- 0.25 packs/day for 40 years    Types: Cigarettes  . Smokeless tobacco: Never Used     Comment:  trying to quit today 02/05/14  . Alcohol Use: No     Comment: quit drugs 2011  marjuana   OB History   Grav Para Term Preterm Abortions TAB SAB Ect Mult Living   2 2 2       2      Review of Systems  Constitutional: Negative for fever and chills.  Respiratory: Negative for shortness of breath.   Cardiovascular: Negative for chest pain.  Gastrointestinal: Negative for nausea, vomiting, diarrhea and constipation.  Genitourinary: Negative for dysuria.  Musculoskeletal: Positive for arthralgias.    Allergies  Bactrim and Sulfa antibiotics  Home Medications   Prior to Admission medications   Medication Sig Start Date End Date Taking? Authorizing Provider  aspirin 81 MG chewable tablet Chew 81 mg by mouth daily.     Historical Provider, MD  atorvastatin (LIPITOR) 80 MG tablet Take 0.5 tablets (40 mg total) by mouth daily. 04/24/13   Larey Dresser, MD  clopidogrel (PLAVIX) 75 MG tablet Take 1 tablet (75 mg total) by mouth daily with breakfast. 04/24/13   Larey Dresser, MD  docusate sodium (COLACE) 100 MG capsule Take 100 mg by mouth 2 (two) times daily. 03/22/13   Reyne Dumas, MD  doxycycline (VIBRA-TABS) 100  MG tablet Take 1 tablet (100 mg total) by mouth 2 (two) times daily. 10/26/13   Theodis Blaze, MD  hydrochlorothiazide (HYDRODIURIL) 25 MG tablet Take 1 tablet (25 mg total) by mouth daily. 02/05/14   Larey Dresser, MD  hydrOXYzine (ATARAX/VISTARIL) 25 MG tablet Take 25 mg by mouth every 8 (eight) hours as needed for anxiety.     Historical Provider, MD  hydrOXYzine (VISTARIL) 25 MG capsule  09/21/13   Historical Provider, MD  losartan (COZAAR) 100 MG tablet Take 1 tablet (100 mg total) by mouth daily. 08/23/13   Larey Dresser, MD  metoprolol tartrate (LOPRESSOR) 25 MG tablet TAKE ONE TABLET BY MOUTH TWICE DAILY 02/01/14   Larey Dresser, MD  nicotine (NICODERM CQ - DOSED IN MG/24 HOURS) 14 mg/24hr patch Place 1 patch onto the skin as needed.  03/28/12   Larey Dresser, MD   nitroGLYCERIN (NITROSTAT) 0.4 MG SL tablet Place 0.4 mg under the tongue every 5 (five) minutes as needed for chest pain.     Historical Provider, MD  polyethylene glycol powder (GLYCOLAX/MIRALAX) powder Take 17 g by mouth 2 (two) times daily as needed. 03/22/13   Reyne Dumas, MD  traMADol (ULTRAM) 50 MG tablet Take 1 tablet (50 mg total) by mouth every 8 (eight) hours as needed for moderate pain or severe pain. 10/26/13   Theodis Blaze, MD   Triage Vitals: BP 158/80  Pulse 63  Temp(Src) 97.8 F (36.6 C) (Oral)  Resp 18  SpO2 100% Physical Exam  Nursing note and vitals reviewed. Constitutional: She is oriented to person, place, and time. She appears well-developed and well-nourished.  HENT:  Head: Normocephalic and atraumatic.  Eyes: EOM are normal.  Neck: Normal range of motion.  Cardiovascular: Normal rate.   Intact distal pulses with brisk capillary refill  Pulmonary/Chest: Effort normal.  Musculoskeletal: Normal range of motion.  Left foot tender to palpation over the dorsal aspect, with some left ankle tenderness near the ATFL, no bony abnormality or deformity, range of motion and strength is limited secondary to pain  Neurological: She is alert and oriented to person, place, and time.  Sensation intact  Skin: Skin is warm and dry.  Psychiatric: She has a normal mood and affect. Her behavior is normal.    ED Course  Procedures (including critical care time) DIAGNOSTIC STUDIES: Oxygen Saturation is 100% on RA, normal by my interpretation.   COORDINATION OF CARE: 3:35 PM- Pt verbalizes understanding and agrees to plan.  Medications - No data to display  Labs Review Labs Reviewed - No data to display  Imaging Review Dg Ankle Complete Left  02/09/2014   CLINICAL DATA:  58 year old female status post twisting injury with pain and swelling. Initial encounter.  EXAM: LEFT ANKLE COMPLETE - 3+ VIEW  COMPARISON:  Left foot series from the same day reported separately.   FINDINGS: Calcaneus intact. Mortise joint alignment preserved. No joint effusion identified. Talar dome intact. No acute fracture or dislocation identified.  IMPRESSION: No acute fracture or dislocation identified about the left ankle.   Electronically Signed   By: Lars Pinks M.D.   On: 02/09/2014 15:52   Dg Foot Complete Left  02/09/2014   CLINICAL DATA:  58 year old female status post twisting injury with pain and swelling. Initial encounter.  EXAM: LEFT FOOT - COMPLETE 3+ VIEW  COMPARISON:  None.  FINDINGS: Bone mineralization is within normal limits. Calcaneus intact. Small accessory ossicle adjacent to the cuboid. No metatarsal fracture identified.  First MTP degenerative change including joint space loss and osteophytosis. No acute fracture or dislocation identified.  IMPRESSION: No acute fracture or dislocation identified about the left foot.   Electronically Signed   By: Lars Pinks M.D.   On: 02/09/2014 15:51     EKG Interpretation None      MDM   Final diagnoses:  Fall  Ankle pain  Ankle sprain, left, initial encounter    Patient with left ankle sprain. Plain films are negative. We'll treat with ankle ASO, crutches, and pain medicine. Recommend orthopedic follow-up.  I personally performed the services described in this documentation, which was scribed in my presence. The recorded information has been reviewed and is accurate.    Montine Circle, PA-C 02/09/14 1620

## 2014-02-12 ENCOUNTER — Encounter (HOSPITAL_COMMUNITY): Payer: Self-pay | Admitting: Emergency Medicine

## 2014-02-13 ENCOUNTER — Ambulatory Visit (HOSPITAL_COMMUNITY): Payer: Medicare Other | Attending: Cardiology | Admitting: Cardiology

## 2014-02-13 ENCOUNTER — Other Ambulatory Visit (INDEPENDENT_AMBULATORY_CARE_PROVIDER_SITE_OTHER): Payer: Medicare Other

## 2014-02-13 DIAGNOSIS — I10 Essential (primary) hypertension: Secondary | ICD-10-CM | POA: Insufficient documentation

## 2014-02-13 DIAGNOSIS — I6522 Occlusion and stenosis of left carotid artery: Secondary | ICD-10-CM

## 2014-02-13 DIAGNOSIS — R0989 Other specified symptoms and signs involving the circulatory and respiratory systems: Secondary | ICD-10-CM

## 2014-02-13 DIAGNOSIS — F17210 Nicotine dependence, cigarettes, uncomplicated: Secondary | ICD-10-CM | POA: Diagnosis not present

## 2014-02-13 DIAGNOSIS — I2510 Atherosclerotic heart disease of native coronary artery without angina pectoris: Secondary | ICD-10-CM | POA: Diagnosis not present

## 2014-02-13 DIAGNOSIS — Z8673 Personal history of transient ischemic attack (TIA), and cerebral infarction without residual deficits: Secondary | ICD-10-CM | POA: Insufficient documentation

## 2014-02-13 DIAGNOSIS — E785 Hyperlipidemia, unspecified: Secondary | ICD-10-CM | POA: Diagnosis not present

## 2014-02-13 LAB — BASIC METABOLIC PANEL
BUN: 13 mg/dL (ref 6–23)
CO2: 33 mEq/L — ABNORMAL HIGH (ref 19–32)
Calcium: 9.8 mg/dL (ref 8.4–10.5)
Chloride: 102 mEq/L (ref 96–112)
Creatinine, Ser: 0.9 mg/dL (ref 0.4–1.2)
GFR: 78.89 mL/min (ref >60.00)
Glucose, Bld: 125 mg/dL — ABNORMAL HIGH (ref 70–99)
Potassium: 3.5 mEq/L (ref 3.5–5.1)
Sodium: 138 mEq/L (ref 135–145)

## 2014-02-13 NOTE — Progress Notes (Signed)
Carotid duplex performed 

## 2014-02-15 DIAGNOSIS — M79672 Pain in left foot: Secondary | ICD-10-CM | POA: Diagnosis not present

## 2014-02-15 DIAGNOSIS — S92355A Nondisplaced fracture of fifth metatarsal bone, left foot, initial encounter for closed fracture: Secondary | ICD-10-CM | POA: Diagnosis not present

## 2014-02-19 ENCOUNTER — Other Ambulatory Visit: Payer: Self-pay | Admitting: *Deleted

## 2014-02-19 DIAGNOSIS — I10 Essential (primary) hypertension: Secondary | ICD-10-CM

## 2014-02-19 DIAGNOSIS — R0989 Other specified symptoms and signs involving the circulatory and respiratory systems: Secondary | ICD-10-CM

## 2014-02-19 MED ORDER — HYDROCHLOROTHIAZIDE 25 MG PO TABS: 25.0000 mg | ORAL_TABLET | Freq: Every day | ORAL | Status: DC

## 2014-03-13 DIAGNOSIS — S93602D Unspecified sprain of left foot, subsequent encounter: Secondary | ICD-10-CM | POA: Diagnosis not present

## 2014-03-13 DIAGNOSIS — M722 Plantar fascial fibromatosis: Secondary | ICD-10-CM | POA: Diagnosis not present

## 2014-03-13 DIAGNOSIS — S92355D Nondisplaced fracture of fifth metatarsal bone, left foot, subsequent encounter for fracture with routine healing: Secondary | ICD-10-CM | POA: Diagnosis not present

## 2014-03-15 ENCOUNTER — Other Ambulatory Visit: Payer: Self-pay | Admitting: Cardiology

## 2014-04-11 DIAGNOSIS — K625 Hemorrhage of anus and rectum: Secondary | ICD-10-CM | POA: Diagnosis not present

## 2014-04-11 DIAGNOSIS — K600 Acute anal fissure: Secondary | ICD-10-CM | POA: Diagnosis not present

## 2014-04-11 DIAGNOSIS — K6289 Other specified diseases of anus and rectum: Secondary | ICD-10-CM | POA: Diagnosis not present

## 2014-04-26 DIAGNOSIS — M79672 Pain in left foot: Secondary | ICD-10-CM | POA: Diagnosis not present

## 2014-04-26 DIAGNOSIS — S92355D Nondisplaced fracture of fifth metatarsal bone, left foot, subsequent encounter for fracture with routine healing: Secondary | ICD-10-CM | POA: Diagnosis not present

## 2014-05-15 ENCOUNTER — Telehealth: Payer: Self-pay | Admitting: Cardiology

## 2014-05-15 NOTE — Telephone Encounter (Signed)
Should be ok as long as no active infection.

## 2014-05-15 NOTE — Telephone Encounter (Signed)
Will forward to Dr Aundra Dubin for review and recommendations.

## 2014-05-15 NOTE — Telephone Encounter (Signed)
New Msg        Pt calling, would like to know if it is ok for her to get shingles vaccine?    Please call and advise.

## 2014-05-15 NOTE — Telephone Encounter (Signed)
LMTCB

## 2014-05-15 NOTE — Telephone Encounter (Signed)
Pt notfied.

## 2014-05-22 ENCOUNTER — Telehealth: Payer: Self-pay | Admitting: Internal Medicine

## 2014-05-22 ENCOUNTER — Telehealth: Payer: Self-pay | Admitting: *Deleted

## 2014-05-22 NOTE — Telephone Encounter (Signed)
Patient called is wanting a medication refill for tramadol.Please f/u with pt.

## 2014-05-30 ENCOUNTER — Other Ambulatory Visit: Payer: Self-pay | Admitting: Internal Medicine

## 2014-05-30 NOTE — Telephone Encounter (Signed)
Left voice message to return call Pt need OV

## 2014-05-30 NOTE — Telephone Encounter (Signed)
Patient called to request a med refill for Tramadol, please f/u with pt. °

## 2014-06-20 ENCOUNTER — Other Ambulatory Visit: Payer: Self-pay | Admitting: Cardiology

## 2014-06-22 ENCOUNTER — Other Ambulatory Visit: Payer: Self-pay

## 2014-06-22 MED ORDER — ATORVASTATIN CALCIUM 80 MG PO TABS: 40.0000 mg | ORAL_TABLET | Freq: Every day | ORAL | Status: DC

## 2014-07-23 ENCOUNTER — Other Ambulatory Visit: Payer: Self-pay | Admitting: Cardiology

## 2014-08-07 ENCOUNTER — Other Ambulatory Visit: Payer: Self-pay | Admitting: Internal Medicine

## 2014-08-07 ENCOUNTER — Other Ambulatory Visit (HOSPITAL_COMMUNITY): Payer: Self-pay | Admitting: Family Medicine

## 2014-08-07 DIAGNOSIS — Z1231 Encounter for screening mammogram for malignant neoplasm of breast: Secondary | ICD-10-CM

## 2014-08-13 ENCOUNTER — Emergency Department (INDEPENDENT_AMBULATORY_CARE_PROVIDER_SITE_OTHER)
Admission: EM | Admit: 2014-08-13 | Discharge: 2014-08-13 | Disposition: A | Discharge status: Home / Self Care | Payer: Medicare Other | Source: Home / Self Care

## 2014-08-13 ENCOUNTER — Encounter (HOSPITAL_COMMUNITY): Payer: Self-pay | Admitting: *Deleted

## 2014-08-13 ENCOUNTER — Telehealth: Payer: Self-pay | Admitting: Cardiology

## 2014-08-13 DIAGNOSIS — R0600 Dyspnea, unspecified: Secondary | ICD-10-CM | POA: Diagnosis not present

## 2014-08-13 DIAGNOSIS — J40 Bronchitis, not specified as acute or chronic: Secondary | ICD-10-CM | POA: Diagnosis not present

## 2014-08-13 MED ORDER — IPRATROPIUM-ALBUTEROL 0.5-2.5 (3) MG/3ML IN SOLN
RESPIRATORY_TRACT | Status: AC
  Filled 2014-08-13: qty 3

## 2014-08-13 MED ORDER — ALBUTEROL SULFATE HFA 108 (90 BASE) MCG/ACT IN AERS: 2.0000 | INHALATION_SPRAY | Freq: Four times a day (QID) | RESPIRATORY_TRACT | Status: DC | PRN

## 2014-08-13 MED ORDER — IPRATROPIUM-ALBUTEROL 0.5-2.5 (3) MG/3ML IN SOLN
3.0000 mL | Freq: Once | RESPIRATORY_TRACT | Status: AC
  Administered 2014-08-13: 3 mL via RESPIRATORY_TRACT

## 2014-08-13 MED ORDER — PREDNISONE 10 MG PO TABS: 30.0000 mg | ORAL_TABLET | Freq: Every day | ORAL | Status: DC

## 2014-08-13 MED ORDER — GUAIFENESIN-CODEINE 100-10 MG/5ML PO SOLN: 5.0000 mL | Freq: Every evening | ORAL | Status: DC | PRN

## 2014-08-13 NOTE — Telephone Encounter (Signed)
New Message  Pt called stating that she has had a cough for the past few days. Pt is not sure that not sure if it is the pollen. She states that she has taken Robitussin and an off brand Thera Flu. Pt is inquiring if she should go to urgent care of if it is a heart related issue. Please callback to discuss.

## 2014-08-13 NOTE — Telephone Encounter (Signed)
Pt states since Friday she has been in bed with head and chest congestion, wheezing, productive cough, nausea and lower back pain. Pt denies edema in her feet, chest pain, or other changes in her breathing.   Pt advised to contact her PCP for further recommendations or report to Urgent Care for evaluation of her symptoms.

## 2014-08-13 NOTE — ED Notes (Signed)
Pt  Reports    Symptoms  Of  A  persistant cough        Short  Of  Breath and  Pain in  Her  Back   When  She  Coughs reports  Sensation of  Tightness  In chest  And  Back  As  Well      Symptoms  X  4  Days

## 2014-08-13 NOTE — ED Provider Notes (Signed)
Vanessa Proctor is a 59 y.o. female who presents to Urgent Care today for cough. Patient notes a one-week history of cough. This is associated with mild wheezing. The cough is nonproductive. No chest pains palpitations or shortness of breath. Patient is a current smoker. She denies any vomiting or diarrhea fevers or chills.   Past Medical History  Diagnosis Date  . Hypertension   . Depression   . Myocardial infarction     november 1st 2012  . Coronary artery disease   . Stroke     november, 2012  . Arthritis     knees  . Nausea & vomiting last month  . Constipation last month  . Rectal bleeding    Past Surgical History  Procedure Laterality Date  . Abdominal hysterectomy  1985    partial  . Coronary angioplasty with stent placement  2012    x 1  . Tonsillectomy  1980  . Knot drained and removed from side of face  2007  . Finger fracture surgery Left     reduction with  splint  . Colonoscopy N/A 04/28/2013    Procedure: COLONOSCOPY;  Surgeon: Beryle Beams, MD;  Location: [facility];  Service: Endoscopy;  Laterality: N/A;   History  Substance Use Topics  . Smoking status: Current Every Day Smoker -- 0.25 packs/day for 40 years    Types: Cigarettes  . Smokeless tobacco: Never Used     Comment: trying to quit today 02/05/14  . Alcohol Use: No     Comment: quit drugs 2011  marjuana   ROS as above Medications: No current facility-administered medications for this encounter.   Current Outpatient Prescriptions  Medication Sig Dispense Refill  . albuterol (PROVENTIL HFA;VENTOLIN HFA) 108 (90 BASE) MCG/ACT inhaler Inhale 2 puffs into the lungs every 6 (six) hours as needed for wheezing or shortness of breath. 1 Inhaler 2  . aspirin 81 MG chewable tablet Chew 81 mg by mouth daily.     Marland Kitchen atorvastatin (LIPITOR) 80 MG tablet Take 0.5 tablets (40 mg total) by mouth daily. 30 tablet 6  . clopidogrel (PLAVIX) 75 MG tablet Take 1 tablet (75 mg total) by mouth daily with  breakfast. 30 tablet 6  . clopidogrel (PLAVIX) 75 MG tablet TAKE ONE TABLET BY MOUTH ONCE DAILY 30 tablet 10  . docusate sodium (COLACE) 100 MG capsule Take 100 mg by mouth 2 (two) times daily.    Marland Kitchen doxycycline (VIBRA-TABS) 100 MG tablet Take 1 tablet (100 mg total) by mouth 2 (two) times daily. 10 tablet 0  . guaiFENesin-codeine 100-10 MG/5ML syrup Take 5 mLs by mouth at bedtime as needed for cough. 120 mL 0  . hydrochlorothiazide (HYDRODIURIL) 25 MG tablet Take 1 tablet (25 mg total) by mouth daily. 30 tablet 11  . hydrochlorothiazide (HYDRODIURIL) 25 MG tablet TAKE ONE TABLET BY MOUTH DAILY 30 tablet 3  . HYDROcodone-acetaminophen (NORCO/VICODIN) 5-325 MG per tablet Take 1 tablet by mouth every 6 (six) hours as needed for moderate pain or severe pain. 10 tablet 0  . hydrOXYzine (ATARAX/VISTARIL) 25 MG tablet Take 25 mg by mouth every 8 (eight) hours as needed for anxiety.     . hydrOXYzine (VISTARIL) 25 MG capsule     . losartan (COZAAR) 100 MG tablet TAKE ONE TABLET BY MOUTH ONCE DAILY 30 tablet 3  . metoprolol tartrate (LOPRESSOR) 25 MG tablet TAKE ONE TABLET BY MOUTH TWICE DAILY 60 tablet 10  . nicotine (NICODERM CQ - DOSED IN MG/24 HOURS)  14 mg/24hr patch Place 1 patch onto the skin as needed.     . nitroGLYCERIN (NITROSTAT) 0.4 MG SL tablet Place 0.4 mg under the tongue every 5 (five) minutes as needed for chest pain.     . polyethylene glycol powder (GLYCOLAX/MIRALAX) powder Take 17 g by mouth 2 (two) times daily as needed. 3350 g 1  . predniSONE (DELTASONE) 10 MG tablet Take 3 tablets (30 mg total) by mouth daily. 15 tablet 0  . traMADol (ULTRAM) 50 MG tablet Take 1 tablet (50 mg total) by mouth every 8 (eight) hours as needed for moderate pain or severe pain. 90 tablet 3   Allergies  Allergen Reactions  . Bactrim Rash  . Sulfa Antibiotics Rash     Exam:  BP 130/69 mmHg  Pulse 87  Temp(Src) 99.1 F (37.3 C) (Oral)  Resp 16  SpO2 100% Gen: Well NAD HEENT: EOMI,  MMM Lungs:  Normal work of breathing. Coarse breath sounds present bilaterally. Heart: RRR no MRG Abd: NABS, Soft. Nondistended, Nontender Exts: Brisk capillary refill, warm and well perfused.   Patient was given a 2.5/0.5 mg DuoNeb nebulizer treatment, and felt better  No results found for this or any previous visit (from the past 24 hour(s)). No results found.  Assessment and Plan: 59 y.o. female with bronchitis. Treat with prednisone and albuterol and codeine cough syrup.  Discussed warning signs or symptoms. Please see discharge instructions. Patient expresses understanding.     Gregor Hams, MD 08/13/14 [phone]

## 2014-08-13 NOTE — Telephone Encounter (Signed)
Follow Up  Per pt- returning phone call from Rn. Please call back and discuss.

## 2014-08-13 NOTE — Discharge Instructions (Signed)
Thank you for coming in today. °Call or go to the emergency room if you get worse, have trouble breathing, have chest pains, or palpitations.  ° °Acute Bronchitis °Bronchitis is inflammation of the airways that extend from the windpipe into the lungs (bronchi). The inflammation often causes mucus to develop. This leads to a cough, which is the most common symptom of bronchitis.  °In acute bronchitis, the condition usually develops suddenly and goes away over time, usually in a couple weeks. Smoking, allergies, and asthma can make bronchitis worse. Repeated episodes of bronchitis may cause further lung problems.  °CAUSES °Acute bronchitis is most often caused by the same virus that causes a cold. The virus can spread from person to person (contagious) through coughing, sneezing, and touching contaminated objects. °SIGNS AND SYMPTOMS  °· Cough.   °· Fever.   °· Coughing up mucus.   °· Body aches.   °· Chest congestion.   °· Chills.   °· Shortness of breath.   °· Sore throat.   °DIAGNOSIS  °Acute bronchitis is usually diagnosed through a physical exam. Your health care provider will also ask you questions about your medical history. Tests, such as chest X-rays, are sometimes done to rule out other conditions.  °TREATMENT  °Acute bronchitis usually goes away in a couple weeks. Oftentimes, no medical treatment is necessary. Medicines are sometimes given for relief of fever or cough. Antibiotic medicines are usually not needed but may be prescribed in certain situations. In some cases, an inhaler may be recommended to help reduce shortness of breath and control the cough. A cool mist vaporizer may also be used to help thin bronchial secretions and make it easier to clear the chest.  °HOME CARE INSTRUCTIONS °· Get plenty of rest.   °· Drink enough fluids to keep your urine clear or pale yellow (unless you have a medical condition that requires fluid restriction). Increasing fluids may help thin your respiratory secretions  (sputum) and reduce chest congestion, and it will prevent dehydration.   °· Take medicines only as directed by your health care provider. °· If you were prescribed an antibiotic medicine, finish it all even if you start to feel better. °· Avoid smoking and secondhand smoke. Exposure to cigarette smoke or irritating chemicals will make bronchitis worse. If you are a smoker, consider using nicotine gum or skin patches to help control withdrawal symptoms. Quitting smoking will help your lungs heal faster.   °· Reduce the chances of another bout of acute bronchitis by washing your hands frequently, avoiding people with cold symptoms, and trying not to touch your hands to your mouth, nose, or eyes.   °· Keep all follow-up visits as directed by your health care provider.   °SEEK MEDICAL CARE IF: °Your symptoms do not improve after 1 week of treatment.  °SEEK IMMEDIATE MEDICAL CARE IF: °· You develop an increased fever or chills.   °· You have chest pain.   °· You have severe shortness of breath. °· You have bloody sputum.   °· You develop dehydration. °· You faint or repeatedly feel like you are going to pass out. °· You develop repeated vomiting. °· You develop a severe headache. °MAKE SURE YOU:  °· Understand these instructions. °· Will watch your condition. °· Will get help right away if you are not doing well or get worse. °Document Released: 05/07/2004 Document Revised: 08/14/2013 Document Reviewed: 09/20/2012 °[employer]® Patient Information ©2015 [employer]. This information is not intended to replace advice given to you by your health care provider. Make sure you discuss any questions you have with your   health care provider. ° °

## 2014-09-11 ENCOUNTER — Ambulatory Visit (HOSPITAL_COMMUNITY)
Admission: RE | Admit: 2014-09-11 | Discharge: 2014-09-11 | Disposition: A | Discharge status: Home / Self Care | Payer: Medicare Other | Source: Ambulatory Visit | Attending: Internal Medicine | Admitting: Internal Medicine

## 2014-09-11 DIAGNOSIS — Z1231 Encounter for screening mammogram for malignant neoplasm of breast: Secondary | ICD-10-CM | POA: Diagnosis present

## 2014-09-17 ENCOUNTER — Telehealth: Payer: Self-pay

## 2014-09-17 NOTE — Telephone Encounter (Signed)
-----   Message from Tresa Garter, MD sent at 09/17/2014 12:25 PM EDT ----- Please inform patient that screening mammogram shows no evidence of malignancy.

## 2014-09-17 NOTE — Telephone Encounter (Signed)
Pt aware of results. Vanessa Proctor

## 2014-10-22 ENCOUNTER — Other Ambulatory Visit: Payer: Self-pay | Admitting: Cardiology

## 2014-10-23 DIAGNOSIS — Z23 Encounter for immunization: Secondary | ICD-10-CM | POA: Diagnosis not present

## 2014-11-19 ENCOUNTER — Ambulatory Visit (INDEPENDENT_AMBULATORY_CARE_PROVIDER_SITE_OTHER): Payer: Medicare Other | Admitting: Physician Assistant

## 2014-11-19 ENCOUNTER — Encounter: Payer: Self-pay | Admitting: Physician Assistant

## 2014-11-19 VITALS — BP 120/70 | HR 78 | Ht 61.0 in | Wt 171.0 lb

## 2014-11-19 DIAGNOSIS — R5383 Other fatigue: Secondary | ICD-10-CM | POA: Insufficient documentation

## 2014-11-19 DIAGNOSIS — Z720 Tobacco use: Secondary | ICD-10-CM

## 2014-11-19 DIAGNOSIS — F17200 Nicotine dependence, unspecified, uncomplicated: Secondary | ICD-10-CM

## 2014-11-19 DIAGNOSIS — R0789 Other chest pain: Secondary | ICD-10-CM | POA: Diagnosis not present

## 2014-11-19 DIAGNOSIS — E785 Hyperlipidemia, unspecified: Secondary | ICD-10-CM

## 2014-11-19 DIAGNOSIS — I2510 Atherosclerotic heart disease of native coronary artery without angina pectoris: Secondary | ICD-10-CM | POA: Diagnosis not present

## 2014-11-19 DIAGNOSIS — I10 Essential (primary) hypertension: Secondary | ICD-10-CM

## 2014-11-19 DIAGNOSIS — R0989 Other specified symptoms and signs involving the circulatory and respiratory systems: Secondary | ICD-10-CM

## 2014-11-19 DIAGNOSIS — R079 Chest pain, unspecified: Secondary | ICD-10-CM | POA: Insufficient documentation

## 2014-11-19 DIAGNOSIS — R5382 Chronic fatigue, unspecified: Secondary | ICD-10-CM

## 2014-11-19 NOTE — Assessment & Plan Note (Signed)
Patient has complaining of some chest pain and exertional dyspnea which is new for her. She has history of CAD status post drug-eluting stent to the RCA in 2012. She is living in a very stressful situation with her estranged brother. Recommend exercise Myoview with Lexi scan back up to rule out ischemia. Follow-up with Dr. Aundra Dubin.

## 2014-11-19 NOTE — Assessment & Plan Note (Signed)
Patient is trying to quit smoking. Smoking cessation discussed.

## 2014-11-19 NOTE — Assessment & Plan Note (Signed)
Continue Plavix.  ?

## 2014-11-19 NOTE — Progress Notes (Signed)
Cardiology Office Note   Date:  11/19/2014   ID:  Vanessa Proctor, DOB 1955/09/16, MRN [id number]  PCP:  Angelica Chessman, MD  Cardiologist: Dr. Aundra Dubin  Chief Complaint: Chest pain dyspnea on exertion    History of Present Illness: Vanessa Proctor is a 59 y.o. female who presents for routine follow-up. She has a history of CAD status post non-STEMI treated with drug-eluting stent to the RCA 02/2011. A few weeks later she had a CVA thought to be due to small vessel disease, carotids showed no stenosis. EF was preserved on echo. She has been maintained on Plavix. She has had chronic dyspnea on exertion. She last saw Dr. Algernon Huxley in October 2015. She is also had mild elevated transaminases with statin use. Carotid Dopplers 26/2035 5-97% LICA, normal right follow-up in 2 years.  The patient's father passed away and she is living in his house with her brother who she is estranged from. It's a very stressful situation and she's had a lot of anxiety living there. She doesn't have any other options at this time. She has some chest tightness when she lays down at night that she thinks is due to stress. She also complains of getting out of breath when she walks up a flight of stairs or does much exertion. She also feels completely fatigued by the end of the day. She is still trying to quit smoking but hasn't completely quit.    Past Medical History  Diagnosis Date  . Hypertension   . Depression   . Myocardial infarction     november 1st 2012  . Coronary artery disease   . Stroke     november, 2012  . Arthritis     knees  . Nausea & vomiting last month  . Constipation last month  . Rectal bleeding     Past Surgical History  Procedure Laterality Date  . Abdominal hysterectomy  1985    partial  . Coronary angioplasty with stent placement  2012    x 1  . Tonsillectomy  1980  . Knot drained and removed from side of face  2007  . Finger fracture surgery Left     reduction with   splint  . Colonoscopy N/A 04/28/2013    Procedure: COLONOSCOPY;  Surgeon: Beryle Beams, MD;  Location: [facility];  Service: Endoscopy;  Laterality: N/A;     Current Outpatient Prescriptions  Medication Sig Dispense Refill  . albuterol (PROVENTIL HFA;VENTOLIN HFA) 108 (90 BASE) MCG/ACT inhaler Inhale 2 puffs into the lungs every 6 (six) hours as needed for wheezing or shortness of breath. 1 Inhaler 2  . aspirin 81 MG chewable tablet Chew 81 mg by mouth daily.     Marland Kitchen atorvastatin (LIPITOR) 80 MG tablet Take 0.5 tablets (40 mg total) by mouth daily. 30 tablet 6  . clopidogrel (PLAVIX) 75 MG tablet Take 1 tablet (75 mg total) by mouth daily with breakfast. 30 tablet 6  . docusate sodium (COLACE) 100 MG capsule Take 100 mg by mouth 2 (two) times daily.    Marland Kitchen doxycycline (VIBRA-TABS) 100 MG tablet Take 1 tablet (100 mg total) by mouth 2 (two) times daily. 10 tablet 0  . guaiFENesin-codeine 100-10 MG/5ML syrup Take 5 mLs by mouth at bedtime as needed for cough. 120 mL 0  . hydrochlorothiazide (HYDRODIURIL) 25 MG tablet TAKE ONE TABLET BY MOUTH ONCE DAILY 30 tablet 3  . HYDROcodone-acetaminophen (NORCO/VICODIN) 5-325 MG per tablet Take 1 tablet by mouth every 6 (six) hours  as needed for moderate pain or severe pain. 10 tablet 0  . hydrOXYzine (ATARAX/VISTARIL) 25 MG tablet Take 25 mg by mouth every 8 (eight) hours as needed for anxiety.     Marland Kitchen losartan (COZAAR) 100 MG tablet TAKE ONE TABLET BY MOUTH ONCE DAILY 30 tablet 3  . metoprolol tartrate (LOPRESSOR) 25 MG tablet TAKE ONE TABLET BY MOUTH TWICE DAILY 60 tablet 10  . nicotine (NICODERM CQ - DOSED IN MG/24 HOURS) 14 mg/24hr patch Place 1 patch onto the skin as needed.     . nitroGLYCERIN (NITROSTAT) 0.4 MG SL tablet Place 0.4 mg under the tongue every 5 (five) minutes as needed for chest pain.     . polyethylene glycol powder (GLYCOLAX/MIRALAX) powder Take 17 g by mouth 2 (two) times daily as needed. 3350 g 1  . predniSONE (DELTASONE) 10 MG tablet  Take 3 tablets (30 mg total) by mouth daily. 15 tablet 0  . traMADol (ULTRAM) 50 MG tablet Take 1 tablet (50 mg total) by mouth every 8 (eight) hours as needed for moderate pain or severe pain. 90 tablet 3   No current facility-administered medications for this visit.    Allergies:   Bactrim and Sulfa antibiotics    Social History:  The patient  reports that she has been smoking Cigarettes.  She has a 10 pack-year smoking history. She has never used smokeless tobacco. She reports that she does not drink alcohol or use illicit drugs.   Family History:  The patient's    family history includes Cancer in her mother; Coronary artery disease in an other family member; Pancreatic cancer in her sister; Stroke in her brother.    ROS:  Please see the history of present illness.   Otherwise, review of systems are positive for excessive fatigue, chronic knee and leg pain, cough, occasional wheezing, constipation, nausea, anxiety depression, chronic back pain and muscle aches, easy bruising, headaches,.   All other systems are reviewed and negative.    PHYSICAL EXAM: VS:  BP 120/70 mmHg  Pulse 78  Ht 5\' 1"  (1.549 m)  Wt 171 lb (77.565 kg)  BMI 32.33 kg/m2 , BMI Body mass index is 32.33 kg/(m^2). GEN: Well nourished, well developed, in no acute distress Neck: Right carotid bruit no JVD, HJR,  or masses Cardiac:  RRR; some skipping 2/6 systolic murmur at the left sternal border, no gallop, rubs, thrill or heave,  Respiratory:  clear to auscultation bilaterally, normal work of breathing GI: soft, nontender, nondistended, + BS MS: no deformity or atrophy Extremities: without cyanosis, clubbing, edema, good distal pulses bilaterally.  Skin: warm and dry, no rash Neuro:  Strength and sensation are intact    EKG:  EKG is ordered today. The ekg ordered today demonstrates normal sinus rhythm with PVCs  Recent Labs: 11/30/2013: TSH 0.852 02/13/2014: BUN 13; Creatinine, Ser 0.9; Potassium 3.5; Sodium  138    Lipid Panel    Component Value Date/Time   CHOL 122 11/30/2013 1129   TRIG 122 11/30/2013 1129   HDL 42 11/30/2013 1129   CHOLHDL 2.9 11/30/2013 1129   VLDL 24 11/30/2013 1129   LDLCALC 56 11/30/2013 1129      Wt Readings from Last 3 Encounters:  11/19/14 171 lb (77.565 kg)  02/05/14 172 lb (78.019 kg)  01/15/14 169 lb 11.2 oz (76.975 kg)      Other studies Reviewed: Additional studies/ records that were reviewed today include and review of the records demonstrates:  Minimal carotid disease son Dopplers  02/2014. Repeat in 2 years  Cardiac cath 02/2011 IMPRESSION:  Successful percutaneous transluminal coronary angioplasty with placement of drug-eluting stent in the mid right coronary artery.   RECOMMENDATIONS:  The patient should be continued on aspirin and Plavix for at least 1 year.  We will continue her beta-blocker and statin medication.  Tobacco cessation is encouraged.     ASSESSMENT AND PLAN:  Chest pain Patient has complaining of some chest pain and exertional dyspnea which is new for her. She has history of CAD status post drug-eluting stent to the RCA in 2012. She is living in a very stressful situation with her estranged brother. Recommend exercise Myoview with Lexi scan back up to rule out ischemia. Follow-up with Dr. Aundra Dubin.  Hyperlipidemia Patient is on Lipitor. Check fasting lipid panel and LFTs.  Right carotid bruit Carotid Dopplers were stable in 02/2014. Will need them repeated next year.  TOBACCO ABUSE Patient is trying to quit smoking. Smoking cessation discussed.  HYPERTENSION, BENIGN ESSENTIAL Blood pressure stable.  CVA (cerebral infarction) Continue Plavix  Fatigue Patient is complaining of excessive fatigue. Will check TSH along with her other labs and electrolytes. Suspect it may be due to anxiety depression.    Signed, Ermalinda Barrios, PA-C  11/19/2014 1:33 PM    [facility] [place],  [place], [place]  [address] Phone: [phone]; Fax: [phone]

## 2014-11-19 NOTE — Assessment & Plan Note (Signed)
Carotid Dopplers were stable in 02/2014. Will need them repeated next year.

## 2014-11-19 NOTE — Assessment & Plan Note (Signed)
Blood pressure stable ? ?

## 2014-11-19 NOTE — Assessment & Plan Note (Signed)
Patient is complaining of excessive fatigue. Will check TSH along with her other labs and electrolytes. Suspect it may be due to anxiety depression.

## 2014-11-19 NOTE — Patient Instructions (Signed)
Medication Instructions:  Your physician recommends that you continue on your current medications as directed. Please refer to the Current Medication list given to you today.   Labwork: Your physician recommends that you return for lab work fasting on day of stress test: BMP/TSH/LIPID   Testing/Procedures: Your physician has requested that you have en exercise stress myoview. For further information please visit [email or web address]. Please follow instruction sheet, as given.    Follow-Up: Your physician recommends that you schedule a follow-up appointment in: 2 months with Dr Aundra Dubin   Any Other Special Instructions Will Be Listed Below (If Applicable).

## 2014-11-19 NOTE — Assessment & Plan Note (Signed)
Patient is on Lipitor. Check fasting lipid panel and LFTs.

## 2014-11-25 ENCOUNTER — Other Ambulatory Visit: Payer: Self-pay | Admitting: Cardiology

## 2014-11-26 ENCOUNTER — Other Ambulatory Visit: Payer: Self-pay | Admitting: Cardiology

## 2014-11-26 MED ORDER — HYDROCHLOROTHIAZIDE 25 MG PO TABS: 25.0000 mg | ORAL_TABLET | Freq: Every day | ORAL | Status: DC

## 2014-11-28 ENCOUNTER — Other Ambulatory Visit (INDEPENDENT_AMBULATORY_CARE_PROVIDER_SITE_OTHER): Payer: Medicare Other | Admitting: *Deleted

## 2014-11-28 ENCOUNTER — Ambulatory Visit (HOSPITAL_COMMUNITY): Payer: Medicare Other | Attending: Cardiovascular Disease

## 2014-11-28 DIAGNOSIS — R0789 Other chest pain: Secondary | ICD-10-CM | POA: Insufficient documentation

## 2014-11-28 DIAGNOSIS — E785 Hyperlipidemia, unspecified: Secondary | ICD-10-CM

## 2014-11-28 DIAGNOSIS — I10 Essential (primary) hypertension: Secondary | ICD-10-CM | POA: Diagnosis not present

## 2014-11-28 DIAGNOSIS — R5382 Chronic fatigue, unspecified: Secondary | ICD-10-CM | POA: Diagnosis not present

## 2014-11-28 LAB — LIPID PANEL
Cholesterol: 118 mg/dL (ref 0–200)
HDL: 35.3 mg/dL — ABNORMAL LOW (ref >39.00)
LDL Cholesterol: 55 mg/dL (ref 0–99)
NonHDL: 82.95
Total CHOL/HDL Ratio: 3
Triglycerides: 139 mg/dL (ref 0.0–149.0)
VLDL: 27.8 mg/dL (ref 0.0–40.0)

## 2014-11-28 LAB — MYOCARDIAL PERFUSION IMAGING
Estimated workload: 7 METS
Exercise duration (min): 6 min
Exercise duration (sec): 0 s
LV dias vol: 52 mL
LV sys vol: 16 mL
MPHR: 159 {beats}/min
Peak HR: 136 {beats}/min
Percent HR: 1 %
RATE: 0.25
RPE: 17
Rest HR: 67 {beats}/min
SDS: 9
SRS: 0
SSS: 9
TID: 0.82

## 2014-11-28 LAB — BASIC METABOLIC PANEL
BUN: 11 mg/dL (ref 6–23)
CO2: 27 mEq/L (ref 19–32)
Calcium: 10.1 mg/dL (ref 8.4–10.5)
Chloride: 106 mEq/L (ref 96–112)
Creatinine, Ser: 0.82 mg/dL (ref 0.40–1.20)
GFR: 90.98 mL/min (ref >60.00)
Glucose, Bld: 102 mg/dL — ABNORMAL HIGH (ref 70–99)
Potassium: 3.6 mEq/L (ref 3.5–5.1)
Sodium: 141 mEq/L (ref 135–145)

## 2014-11-28 LAB — TSH: TSH: 1.43 u[IU]/mL (ref 0.35–4.50)

## 2014-11-28 MED ORDER — TECHNETIUM TC 99M SESTAMIBI GENERIC - CARDIOLITE
32.9000 | Freq: Once | INTRAVENOUS | Status: AC | PRN
  Administered 2014-11-28: 32.9 via INTRAVENOUS

## 2014-11-28 MED ORDER — TECHNETIUM TC 99M SESTAMIBI GENERIC - CARDIOLITE
10.8000 | Freq: Once | INTRAVENOUS | Status: AC | PRN
  Administered 2014-11-28: 11 via INTRAVENOUS

## 2014-11-28 NOTE — Addendum Note (Signed)
Addended by: Eulis Foster on: 11/28/2014 07:46 AM   Modules accepted: Orders

## 2014-12-04 ENCOUNTER — Telehealth: Payer: Self-pay | Admitting: *Deleted

## 2014-12-04 NOTE — Telephone Encounter (Signed)
Notes Recorded by Imogene Burn, PA-C on 12/03/2014 at 7:53 AM Low risk stress test. F/u with Dr. Aundra Dubin as scheduled  12/04/14--pt notified.

## 2014-12-05 ENCOUNTER — Telehealth: Payer: Self-pay | Admitting: *Deleted

## 2014-12-05 NOTE — Telephone Encounter (Signed)
-----   Message from Imogene Burn, PA-C sent at 12/03/2014  7:53 AM EDT ----- Low risk stress test. F/u with Dr. Aundra Dubin as scheduled

## 2014-12-18 DIAGNOSIS — Z23 Encounter for immunization: Secondary | ICD-10-CM | POA: Diagnosis not present

## 2015-02-01 ENCOUNTER — Encounter: Payer: Self-pay | Admitting: Cardiology

## 2015-02-01 ENCOUNTER — Ambulatory Visit (INDEPENDENT_AMBULATORY_CARE_PROVIDER_SITE_OTHER): Payer: Medicare Other | Admitting: Cardiology

## 2015-02-01 VITALS — BP 128/80 | HR 85 | Ht 61.0 in | Wt 169.8 lb

## 2015-02-01 DIAGNOSIS — I10 Essential (primary) hypertension: Secondary | ICD-10-CM

## 2015-02-01 DIAGNOSIS — F17200 Nicotine dependence, unspecified, uncomplicated: Secondary | ICD-10-CM

## 2015-02-01 DIAGNOSIS — E785 Hyperlipidemia, unspecified: Secondary | ICD-10-CM | POA: Diagnosis not present

## 2015-02-01 DIAGNOSIS — I2510 Atherosclerotic heart disease of native coronary artery without angina pectoris: Secondary | ICD-10-CM

## 2015-02-01 DIAGNOSIS — I2583 Coronary atherosclerosis due to lipid rich plaque: Principal | ICD-10-CM

## 2015-02-01 MED ORDER — CITALOPRAM HYDROBROMIDE 20 MG PO TABS: 20.0000 mg | ORAL_TABLET | Freq: Every day | ORAL | Status: DC

## 2015-02-01 NOTE — Patient Instructions (Signed)
Medication Instructions:  Your physician has recommended you make the following change in your medication:  1) START Celexa 20 mg daily  If you need a refill on your cardiac medications before your next appointment, please call your pharmacy.   Labwork: none  Testing/Procedures: none  Follow-Up: Your physician wants you to follow-up in: 12 months with Dr. Aundra Dubin. You will receive a reminder letter in the mail two months in advance. If you don't receive a letter, please call our office to schedule the follow-up appointment.  I will get your lab work from your [facility], as soon as possible.   Any Other Special Instructions Will Be Listed Below (If Applicable).

## 2015-02-02 NOTE — Progress Notes (Signed)
Patient ID: Vanessa Proctor, female   DOB: Jul 19, 1955, 59 y.o.   MRN: [id number] PCP: Dr. Doreene Burke  59 yo with history of CAD s/p NSTEMI and CVA presents for cardiology followup.  Vanessa Proctor had NSTEMI with PCI to RCA with DES in 11/12.  A few weeks after the NSTEMI, she had a CVA.  EF was preserved on echo.  She had more chest pain this summer and had a Cardiolite in 8/16 with no ischemia, low risk.    She has been under a lot of stress at home.  She has occasional atypical chest pain that seems to be associated with a stressful day and tends to occur when she lies down at night.  No exertional chest pain.  She has had a hard time with anxiety.  She does not enjoy being around other people.  Her mood is depressed.  She used to take Celexa but has been off it for several years.  She says that it helped in the past. She is still smoking about 1 cigarette/day.   Labs (11/12): K 3.9, creatinine 0.77 Labs (10/13): LDL 62, HDL 36, AST 47, ALT 43 Labs (11/13): AST 48, ALT 40 Labs (8/14): LDL 73, HDL 42 Labs (12/14): TSH normal, K 4.8, creatinine 0.78 Labs (4/15): K 4.3, creatinine 0.8 Labs (8/15): LDL 56, HDL 42 Labs (8/16): K 3.6, creatinine 0.82, TSH normal, LDL 55, HDL 35  ECG: NSR, normal  PMH: 1. HTN 2. Depression 3. CAD: NSTEMI in 11/12.  LHC with EF 55-60%, basal inferior HK, 99% mRCA stenosis, 40% OM1.  Patient had Promus DES to RCA.  Cardiolite (8/16) with fixed small anteroseptal defect likely artifcact, no ischemia, EF 69%, low risk.  4. CVA: 11/12, right external capsule.  Thought to be due to small vessel disease.  Carotid dopplers showed no significant stenosis.  Carotid dopplers 11/15 with minimal disease.  5. Echo (11/12): EF 65-70%, no significant valvular disease.  6. Elevated transaminases: Mild, with statin use.  7. ABIs (9/13): Normal.  8. Active smoker.  9. Colon polyps  SH: Unemployed, has 2 daughters.  Enrolled at [employer] currently.  Active smoker but only rare  cigarettes at this point.   FH: Father with CHF.  ROS: All systems reviewed and negative except as per HPI.   Current Outpatient Prescriptions  Medication Sig Dispense Refill  . albuterol (PROVENTIL HFA;VENTOLIN HFA) 108 (90 BASE) MCG/ACT inhaler Inhale 2 puffs into the lungs every 6 (six) hours as needed for wheezing or shortness of breath. 1 Inhaler 2  . aspirin 81 MG chewable tablet Chew 81 mg by mouth daily.     Marland Kitchen atorvastatin (LIPITOR) 80 MG tablet Take 0.5 tablets (40 mg total) by mouth daily. 30 tablet 6  . clopidogrel (PLAVIX) 75 MG tablet Take 1 tablet (75 mg total) by mouth daily with breakfast. 30 tablet 6  . hydrochlorothiazide (HYDRODIURIL) 25 MG tablet Take 1 tablet (25 mg total) by mouth daily. 30 tablet 3  . losartan (COZAAR) 100 MG tablet TAKE ONE TABLET BY MOUTH ONCE DAILY 30 tablet 3  . metoprolol tartrate (LOPRESSOR) 25 MG tablet TAKE ONE TABLET BY MOUTH TWICE DAILY 60 tablet 10  . nicotine (NICODERM CQ - DOSED IN MG/24 HOURS) 14 mg/24hr patch Place 1 patch onto the skin as needed.     . nitroGLYCERIN (NITROSTAT) 0.4 MG SL tablet Place 0.4 mg under the tongue every 5 (five) minutes as needed for chest pain.     . polyethylene glycol powder (  GLYCOLAX/MIRALAX) powder Take 17 g by mouth 2 (two) times daily as needed. 3350 g 1  . citalopram (CELEXA) 20 MG tablet Take 1 tablet (20 mg total) by mouth daily. 90 tablet 3   No current facility-administered medications for this visit.    BP 128/80 mmHg  Pulse 85  Ht 5\' 1"  (1.549 m)  Wt 169 lb 12.8 oz (77.021 kg)  BMI 32.10 kg/m2  SpO2 99% General: NAD Neck: No JVD, no thyromegaly or thyroid nodule.  Lungs: Clear to auscultation bilaterally with normal respiratory effort. CV: Nondisplaced PMI.  Heart regular S1/S2, no S3/S4, 1/6 SEM.  No edema.  Right carotid bruit. Normal pulses.  Feet are warm.   Abdomen: Soft, nontender, no hepatosplenomegaly, no distention.  Neurologic: Alert and oriented x 3.  Psych: Normal  affect. Extremities: No clubbing or cyanosis.   Assessment/Plan 1. CAD: Atypical chest pain, suspect noncardiac. She will continue ASA 81, Plavix (with prior CVA), metoprolol, and statin.  Cardiolite in 8/16 with no ischemia.   2. Hyperlipidemia: Good lipids on atorvastatin 40.   3. HTN: BP controlled.  4. Smoking: I strongly encouraged her to quit smoking.   5. Carotid bruit: Right carotid bruit.  She has had minimal disease on carotid dopplers.  6. H/o CVA: Patient continues Plavix.   7. Depression/anxiety: This seems fairly significant.  I will start her back on Celexa 20 mg daily as this has helped in the past.  She will need to followup with her PCP for monitoring of depression.   Followup with me in 1 year.   Loralie Champagne 61/22/2018

## 2015-02-18 ENCOUNTER — Emergency Department (INDEPENDENT_AMBULATORY_CARE_PROVIDER_SITE_OTHER): Payer: Medicare Other

## 2015-02-18 ENCOUNTER — Encounter (HOSPITAL_COMMUNITY): Payer: Self-pay | Admitting: Emergency Medicine

## 2015-02-18 ENCOUNTER — Emergency Department (HOSPITAL_COMMUNITY)
Admission: EM | Admit: 2015-02-18 | Discharge: 2015-02-18 | Disposition: A | Discharge status: Home / Self Care | Payer: Medicare Other | Source: Home / Self Care

## 2015-02-18 ENCOUNTER — Telehealth: Payer: Self-pay | Admitting: Cardiology

## 2015-02-18 DIAGNOSIS — S59912A Unspecified injury of left forearm, initial encounter: Secondary | ICD-10-CM | POA: Diagnosis not present

## 2015-02-18 DIAGNOSIS — S40022A Contusion of left upper arm, initial encounter: Secondary | ICD-10-CM | POA: Diagnosis not present

## 2015-02-18 DIAGNOSIS — S5012XA Contusion of left forearm, initial encounter: Secondary | ICD-10-CM | POA: Diagnosis not present

## 2015-02-18 NOTE — ED Notes (Signed)
Patient reports falling on Thursday 11/3.  No known reason.  Denies pain prior to this event, denies tripping.  Patient has a bruise to left forearm and admits to pain in left arm.

## 2015-02-18 NOTE — Telephone Encounter (Signed)
If she did not pass out or get lightheaded and thinks she tripped, probably does not need heart monitor.  She probably ought to get an X-ray of her arm that she fell on.

## 2015-02-18 NOTE — ED Provider Notes (Signed)
CSN: [id number]     Arrival date & time 02/18/15  1330 History   None    Chief Complaint  Patient presents with  . Arm Pain   (Consider location/radiation/quality/duration/timing/severity/associated sxs/prior Treatment) HPI History obtained from patient:   LOCATION:left forearm SEVERITY:3 DURATION: since thursday CONTEXT: mechanical fall QUALITY:ache MODIFYING FACTORS: none ASSOCIATED SYMPTOMS: none TIMING:constant  Past Medical History  Diagnosis Date  . Hypertension   . Depression   . Myocardial infarction [facility])     november 1st 2012  . Coronary artery disease   . Stroke [facility])     november, 2012  . Arthritis     knees  . Nausea & vomiting last month  . Constipation last month  . Rectal bleeding    Past Surgical History  Procedure Laterality Date  . Abdominal hysterectomy  1985    partial  . Coronary angioplasty with stent placement  2012    x 1  . Tonsillectomy  1980  . Knot drained and removed from side of face  2007  . Finger fracture surgery Left     reduction with  splint  . Colonoscopy N/A 04/28/2013    Procedure: COLONOSCOPY;  Surgeon: Beryle Beams, MD;  Location: [facility];  Service: Endoscopy;  Laterality: N/A;   Family History  Problem Relation Age of Onset  . Coronary artery disease    . Cancer Mother     mass in abdomin  . Pancreatic cancer Sister   . Stroke Brother   . Heart attack Maternal Grandfather   . Hypertension Maternal Grandfather    Social History  Substance Use Topics  . Smoking status: Current Every Day Smoker -- 0.25 packs/day for 40 years    Types: Cigarettes  . Smokeless tobacco: Never Used     Comment: trying to quit today 02/05/14  . Alcohol Use: No     Comment: quit drugs 2011  marjuana   OB History    Gravida Para Term Preterm AB TAB SAB Ectopic Multiple Living   2 2 2       2      Review of Systems ROS +'ve left forearm injury  Denies: HEADACHE, NAUSEA, ABDOMINAL PAIN, CHEST PAIN, CONGESTION, DYSURIA,  SHORTNESS OF BREATH  Allergies  Bactrim and Sulfa antibiotics  Home Medications   Prior to Admission medications   Medication Sig Start Date End Date Taking? Authorizing Provider  albuterol (PROVENTIL HFA;VENTOLIN HFA) 108 (90 BASE) MCG/ACT inhaler Inhale 2 puffs into the lungs every 6 (six) hours as needed for wheezing or shortness of breath. 08/13/14   Gregor Hams, MD  aspirin 81 MG chewable tablet Chew 81 mg by mouth daily.     Historical Provider, MD  atorvastatin (LIPITOR) 80 MG tablet Take 0.5 tablets (40 mg total) by mouth daily. 06/22/14   Larey Dresser, MD  citalopram (CELEXA) 20 MG tablet Take 1 tablet (20 mg total) by mouth daily. 02/01/15   Larey Dresser, MD  clopidogrel (PLAVIX) 75 MG tablet Take 1 tablet (75 mg total) by mouth daily with breakfast. 04/24/13   Larey Dresser, MD  hydrochlorothiazide (HYDRODIURIL) 25 MG tablet Take 1 tablet (25 mg total) by mouth daily. 11/26/14   Larey Dresser, MD  losartan (COZAAR) 100 MG tablet TAKE ONE TABLET BY MOUTH ONCE DAILY 11/26/14   Larey Dresser, MD  metoprolol tartrate (LOPRESSOR) 25 MG tablet TAKE ONE TABLET BY MOUTH TWICE DAILY 03/16/14   Larey Dresser, MD  nicotine (NICODERM CQ -  DOSED IN MG/24 HOURS) 14 mg/24hr patch Place 1 patch onto the skin as needed.  03/28/12   Larey Dresser, MD  nitroGLYCERIN (NITROSTAT) 0.4 MG SL tablet Place 0.4 mg under the tongue every 5 (five) minutes as needed for chest pain.     Historical Provider, MD  polyethylene glycol powder (GLYCOLAX/MIRALAX) powder Take 17 g by mouth 2 (two) times daily as needed. 03/22/13   Reyne Dumas, MD   Meds Ordered and Administered this Visit  Medications - No data to display  BP 152/78 mmHg  Pulse 66  Temp(Src) 98 F (36.7 C) (Oral)  Resp 16  SpO2 99% No data found.   Physical Exam  Constitutional: She is oriented to person, place, and time. She appears well-developed and well-nourished.  Cardiovascular: Normal rate and regular rhythm.     Pulmonary/Chest: Effort normal and breath sounds normal.  Musculoskeletal:       Left elbow: She exhibits normal range of motion and no swelling. Tenderness found. No radial head, no medial epicondyle and no lateral epicondyle tenderness noted.       Arms: Neurological: She is alert and oriented to person, place, and time.  Nursing note and vitals reviewed.   ED Course  Procedures (including critical care time)  Labs Review Labs Reviewed - No data to display  Imaging Review Dg Forearm Left  02/18/2015  CLINICAL DATA:  [place] last Thursday and injured left forearm. Large bruise. EXAM: LEFT FOREARM - 2 VIEW COMPARISON:  None. FINDINGS: The wrist and elbow joints are maintained. No acute forearm fracture is identified. There is a prominent radial tuberosity. This could be a tuft like process from the biceps tendon or possibly a the osteochondroma. It is likely incidental. IMPRESSION: No acute forearm fracture. Electronically Signed   By: Marijo Sanes M.D.   On: 02/18/2015 16:37     Visual Acuity Review  Right Eye Distance:   Left Eye Distance:   Bilateral Distance:    Right Eye Near:   Left Eye Near:    Bilateral Near:         MDM   1. Arm contusion, left, initial encounter      Review of xray does not reveal fracture. Suggest continued symptomatic treatment No neurological findings that suggest [place]. Pt drove to UC, and has performed all home duties without problem. States she went to church yesterday and fully participated.  Follow up with PCP as needed.     Konrad Felix, [place] 02/18/15 1711

## 2015-02-18 NOTE — Discharge Instructions (Signed)

## 2015-02-18 NOTE — Telephone Encounter (Signed)
New Message    Pt calling stating that she had a fall over the weekend and she is pretty badly bruised and she is a bit concerned because she doesn't know what caused the fall. Please call back and advise.

## 2015-02-18 NOTE — Telephone Encounter (Signed)
Pt states that she had a fall Thursday night about 8 PM. Pt states she does not recall any symptoms before falling except feeling a little anxious, pt denies chest pain, tightness, SOB,she remembers falling.  Pt states she did not pass out.  Pt states that she was in a hurry and may have tripped over her feet.  Pt  states she hit the end of the bed with her left arm, her left arm is sore this morning with bruising, she is able to move her arm normally.  Pt states her BP was 128/81 last night, her heart rate was 66 last night.   Pt advised I will forward to Dr Aundra Dubin for review.

## 2015-02-18 NOTE — Telephone Encounter (Signed)
Pt advised, verbalized understanding, pt denies passing out, pt states she will probably go to Urgent Care to have arm xray.

## 2015-02-25 ENCOUNTER — Other Ambulatory Visit: Payer: Self-pay | Admitting: Cardiology

## 2015-02-28 ENCOUNTER — Ambulatory Visit: Payer: Self-pay | Admitting: Internal Medicine

## 2015-03-03 ENCOUNTER — Encounter (HOSPITAL_COMMUNITY): Payer: Self-pay | Admitting: Emergency Medicine

## 2015-03-03 ENCOUNTER — Inpatient Hospital Stay (HOSPITAL_COMMUNITY): Payer: Medicare Other

## 2015-03-03 ENCOUNTER — Emergency Department (HOSPITAL_COMMUNITY): Payer: Medicare Other

## 2015-03-03 ENCOUNTER — Inpatient Hospital Stay (HOSPITAL_COMMUNITY)
Admission: EM | Admit: 2015-03-03 | Discharge: 2015-03-04 | DRG: 312 | Disposition: A | Discharge status: Home Health | Payer: Medicare Other | Attending: Internal Medicine | Admitting: Internal Medicine

## 2015-03-03 DIAGNOSIS — R42 Dizziness and giddiness: Secondary | ICD-10-CM | POA: Diagnosis not present

## 2015-03-03 DIAGNOSIS — Z90710 Acquired absence of both cervix and uterus: Secondary | ICD-10-CM | POA: Diagnosis not present

## 2015-03-03 DIAGNOSIS — E785 Hyperlipidemia, unspecified: Secondary | ICD-10-CM | POA: Diagnosis present

## 2015-03-03 DIAGNOSIS — I10 Essential (primary) hypertension: Secondary | ICD-10-CM | POA: Diagnosis present

## 2015-03-03 DIAGNOSIS — G8194 Hemiplegia, unspecified affecting left nondominant side: Secondary | ICD-10-CM | POA: Diagnosis not present

## 2015-03-03 DIAGNOSIS — Z7982 Long term (current) use of aspirin: Secondary | ICD-10-CM | POA: Diagnosis not present

## 2015-03-03 DIAGNOSIS — Z7902 Long term (current) use of antithrombotics/antiplatelets: Secondary | ICD-10-CM

## 2015-03-03 DIAGNOSIS — Z8673 Personal history of transient ischemic attack (TIA), and cerebral infarction without residual deficits: Secondary | ICD-10-CM | POA: Diagnosis not present

## 2015-03-03 DIAGNOSIS — E860 Dehydration: Secondary | ICD-10-CM | POA: Diagnosis present

## 2015-03-03 DIAGNOSIS — N179 Acute kidney failure, unspecified: Secondary | ICD-10-CM | POA: Diagnosis present

## 2015-03-03 DIAGNOSIS — I2510 Atherosclerotic heart disease of native coronary artery without angina pectoris: Secondary | ICD-10-CM | POA: Diagnosis present

## 2015-03-03 DIAGNOSIS — R55 Syncope and collapse: Secondary | ICD-10-CM | POA: Diagnosis present

## 2015-03-03 DIAGNOSIS — I252 Old myocardial infarction: Secondary | ICD-10-CM

## 2015-03-03 DIAGNOSIS — Z8249 Family history of ischemic heart disease and other diseases of the circulatory system: Secondary | ICD-10-CM | POA: Diagnosis not present

## 2015-03-03 DIAGNOSIS — Z823 Family history of stroke: Secondary | ICD-10-CM | POA: Diagnosis not present

## 2015-03-03 DIAGNOSIS — E876 Hypokalemia: Secondary | ICD-10-CM | POA: Diagnosis present

## 2015-03-03 DIAGNOSIS — F17210 Nicotine dependence, cigarettes, uncomplicated: Secondary | ICD-10-CM | POA: Diagnosis present

## 2015-03-03 DIAGNOSIS — F419 Anxiety disorder, unspecified: Secondary | ICD-10-CM | POA: Diagnosis present

## 2015-03-03 DIAGNOSIS — F17200 Nicotine dependence, unspecified, uncomplicated: Secondary | ICD-10-CM | POA: Diagnosis present

## 2015-03-03 DIAGNOSIS — F1910 Other psychoactive substance abuse, uncomplicated: Secondary | ICD-10-CM

## 2015-03-03 DIAGNOSIS — F329 Major depressive disorder, single episode, unspecified: Secondary | ICD-10-CM | POA: Diagnosis present

## 2015-03-03 DIAGNOSIS — R51 Headache: Secondary | ICD-10-CM | POA: Diagnosis present

## 2015-03-03 DIAGNOSIS — Z720 Tobacco use: Secondary | ICD-10-CM | POA: Insufficient documentation

## 2015-03-03 DIAGNOSIS — Z882 Allergy status to sulfonamides status: Secondary | ICD-10-CM

## 2015-03-03 DIAGNOSIS — R0602 Shortness of breath: Secondary | ICD-10-CM | POA: Diagnosis not present

## 2015-03-03 DIAGNOSIS — Z79899 Other long term (current) drug therapy: Secondary | ICD-10-CM

## 2015-03-03 DIAGNOSIS — M170 Bilateral primary osteoarthritis of knee: Secondary | ICD-10-CM | POA: Diagnosis present

## 2015-03-03 DIAGNOSIS — Z955 Presence of coronary angioplasty implant and graft: Secondary | ICD-10-CM | POA: Diagnosis not present

## 2015-03-03 DIAGNOSIS — F32A Depression, unspecified: Secondary | ICD-10-CM | POA: Diagnosis present

## 2015-03-03 DIAGNOSIS — Z881 Allergy status to other antibiotic agents status: Secondary | ICD-10-CM | POA: Diagnosis not present

## 2015-03-03 HISTORY — DX: Tobacco use: Z72.0

## 2015-03-03 HISTORY — DX: Other psychoactive substance abuse, uncomplicated: F19.10

## 2015-03-03 LAB — BASIC METABOLIC PANEL
Anion gap: 9 (ref 5–15)
BUN: 14 mg/dL (ref 6–20)
CO2: 28 mmol/L (ref 22–32)
Calcium: 10.2 mg/dL (ref 8.9–10.3)
Chloride: 102 mmol/L (ref 101–111)
Creatinine, Ser: 1.21 mg/dL — ABNORMAL HIGH (ref 0.44–1.00)
GFR calc Af Amer: 55 mL/min — ABNORMAL LOW (ref >60)
GFR calc non Af Amer: 47 mL/min — ABNORMAL LOW (ref >60)
Glucose, Bld: 126 mg/dL — ABNORMAL HIGH (ref 65–99)
Potassium: 3.1 mmol/L — ABNORMAL LOW (ref 3.5–5.1)
Sodium: 139 mmol/L (ref 135–145)

## 2015-03-03 LAB — CBG MONITORING, ED: Glucose-Capillary: 122 mg/dL — ABNORMAL HIGH (ref 65–99)

## 2015-03-03 LAB — CBC
HCT: 40.5 % (ref 36.0–46.0)
Hemoglobin: 13.4 g/dL (ref 12.0–15.0)
MCH: 28.8 pg (ref 26.0–34.0)
MCHC: 33.1 g/dL (ref 30.0–36.0)
MCV: 86.9 fL (ref 78.0–100.0)
Platelets: 250 10*3/uL (ref 150–400)
RBC: 4.66 MIL/uL (ref 3.87–5.11)
RDW: 14.1 % (ref 11.5–15.5)
WBC: 6.6 10*3/uL (ref 4.0–10.5)

## 2015-03-03 LAB — APTT: aPTT: 32 seconds (ref 24–37)

## 2015-03-03 LAB — I-STAT TROPONIN, ED: Troponin i, poc: 0.01 ng/mL (ref 0.00–0.08)

## 2015-03-03 LAB — PROTIME-INR
INR: 1.05 (ref 0.00–1.49)
Prothrombin Time: 13.9 seconds (ref 11.6–15.2)

## 2015-03-03 LAB — MAGNESIUM: Magnesium: 2.1 mg/dL (ref 1.7–2.4)

## 2015-03-03 LAB — TROPONIN I: Troponin I: <0.03 ng/mL (ref <0.031)

## 2015-03-03 MED ORDER — NICOTINE 14 MG/24HR TD PT24
14.0000 mg | MEDICATED_PATCH | Freq: Every day | TRANSDERMAL | Status: DC | PRN
  Filled 2015-03-03: qty 1

## 2015-03-03 MED ORDER — DM-GUAIFENESIN ER 30-600 MG PO TB12
1.0000 | ORAL_TABLET | Freq: Two times a day (BID) | ORAL | Status: DC
  Administered 2015-03-04 (×2): 1 via ORAL
  Filled 2015-03-03 (×3): qty 1

## 2015-03-03 MED ORDER — POTASSIUM PHOSPHATES 15 MMOLE/5ML IV SOLN
10.0000 meq | Freq: Once | INTRAVENOUS | Status: DC
  Filled 2015-03-03: qty 2.27

## 2015-03-03 MED ORDER — CITALOPRAM HYDROBROMIDE 10 MG PO TABS
20.0000 mg | ORAL_TABLET | Freq: Every day | ORAL | Status: DC
  Administered 2015-03-04: 20 mg via ORAL
  Filled 2015-03-03: qty 2

## 2015-03-03 MED ORDER — SODIUM CHLORIDE 0.9 % IV BOLUS (SEPSIS)
1000.0000 mL | Freq: Once | INTRAVENOUS | Status: AC
  Administered 2015-03-03: 1000 mL via INTRAVENOUS

## 2015-03-03 MED ORDER — ONDANSETRON HCL 4 MG/2ML IJ SOLN: 4.0000 mg | Freq: Three times a day (TID) | INTRAMUSCULAR | Status: DC | PRN

## 2015-03-03 MED ORDER — METOPROLOL TARTRATE 25 MG PO TABS
25.0000 mg | ORAL_TABLET | Freq: Two times a day (BID) | ORAL | Status: DC
  Administered 2015-03-04 (×2): 25 mg via ORAL
  Filled 2015-03-03 (×2): qty 1

## 2015-03-03 MED ORDER — SODIUM CHLORIDE 0.9 % IV SOLN
INTRAVENOUS | Status: DC
  Administered 2015-03-04: 02:00:00 via INTRAVENOUS

## 2015-03-03 MED ORDER — POTASSIUM CHLORIDE CRYS ER 20 MEQ PO TBCR
40.0000 meq | EXTENDED_RELEASE_TABLET | Freq: Once | ORAL | Status: AC
  Administered 2015-03-03: 40 meq via ORAL
  Filled 2015-03-03: qty 2

## 2015-03-03 MED ORDER — ALBUTEROL SULFATE HFA 108 (90 BASE) MCG/ACT IN AERS: 2.0000 | INHALATION_SPRAY | Freq: Four times a day (QID) | RESPIRATORY_TRACT | Status: DC | PRN

## 2015-03-03 MED ORDER — STROKE: EARLY STAGES OF RECOVERY BOOK
Freq: Once | Status: AC
  Administered 2015-03-04: 02:00:00
  Filled 2015-03-03: qty 1

## 2015-03-03 MED ORDER — ASPIRIN 81 MG PO CHEW
81.0000 mg | CHEWABLE_TABLET | Freq: Every day | ORAL | Status: DC
  Administered 2015-03-04: 81 mg via ORAL
  Filled 2015-03-03: qty 1

## 2015-03-03 MED ORDER — ATORVASTATIN CALCIUM 40 MG PO TABS
40.0000 mg | ORAL_TABLET | Freq: Every day | ORAL | Status: DC
  Administered 2015-03-04: 40 mg via ORAL
  Filled 2015-03-03: qty 1

## 2015-03-03 MED ORDER — CLOPIDOGREL BISULFATE 75 MG PO TABS
75.0000 mg | ORAL_TABLET | Freq: Every day | ORAL | Status: DC
  Administered 2015-03-04: 75 mg via ORAL
  Filled 2015-03-03: qty 1

## 2015-03-03 MED ORDER — SENNOSIDES-DOCUSATE SODIUM 8.6-50 MG PO TABS
1.0000 | ORAL_TABLET | Freq: Every evening | ORAL | Status: DC | PRN
  Filled 2015-03-03: qty 1

## 2015-03-03 MED ORDER — HYDRALAZINE HCL 20 MG/ML IJ SOLN: 5.0000 mg | INTRAMUSCULAR | Status: DC | PRN

## 2015-03-03 MED ORDER — HEPARIN SODIUM (PORCINE) 5000 UNIT/ML IJ SOLN
5000.0000 [IU] | Freq: Three times a day (TID) | INTRAMUSCULAR | Status: DC
  Administered 2015-03-04 (×3): 5000 [IU] via SUBCUTANEOUS
  Filled 2015-03-03 (×3): qty 1

## 2015-03-03 NOTE — ED Notes (Addendum)
C/o 2 syncopal episodes in the past 2 weeks.  Pt states she c/o nausea, dizziness, and posterior neck pain prior to passing out.  Pt denies any symptoms at present. Ecchymosis to L arm from previous fall/syncopal episode 2 weeks ago.  Smoked marijuana today.

## 2015-03-03 NOTE — ED Notes (Addendum)
Attempted report x2, RN not available

## 2015-03-03 NOTE — ED Notes (Signed)
Attempted report, no one answered the phone

## 2015-03-03 NOTE — ED Notes (Signed)
Pt off unit for CT

## 2015-03-03 NOTE — ED Provider Notes (Signed)
CSN: [id number]     Arrival date & time 03/03/15  1753 History   First MD Initiated Contact with Patient 03/03/15 [place]     Chief Complaint  Patient presents with  . syncopal episode     Patient is a 59 y.o. female presenting with syncope. The history is provided by the patient and a relative.  Loss of Consciousness Episode history:  Single Most recent episode:  Today Chronicity:  New Witnessed: yes   Associated symptoms: headaches   Associated symptoms: no chest pain, no confusion, no difficulty breathing, no dizziness, no fever, no nausea, no palpitations, no seizures, no shortness of breath and no vomiting   Risk factors: coronary artery disease     Past Medical History  Diagnosis Date  . Hypertension   . Depression   . Myocardial infarction [facility])     november 1st 2012  . Coronary artery disease   . Stroke [facility])     november, 2012  . Arthritis     knees  . Nausea & vomiting last month  . Constipation last month  . Rectal bleeding   . Tobacco abuse   . Drug abuse    Past Surgical History  Procedure Laterality Date  . Abdominal hysterectomy  1985    partial  . Coronary angioplasty with stent placement  2012    x 1  . Tonsillectomy  1980  . Knot drained and removed from side of face  2007  . Finger fracture surgery Left     reduction with  splint  . Colonoscopy N/A 04/28/2013    Procedure: COLONOSCOPY;  Surgeon: Beryle Beams, MD;  Location: [facility];  Service: Endoscopy;  Laterality: N/A;   Family History  Problem Relation Age of Onset  . Coronary artery disease    . Cancer Mother     mass in abdomin  . Pancreatic cancer Sister   . Stroke Brother   . Heart attack Maternal Grandfather   . Hypertension Maternal Grandfather    Social History  Substance Use Topics  . Smoking status: Current Every Day Smoker -- 0.25 packs/day for 40 years    Types: Cigarettes  . Smokeless tobacco: Never Used     Comment: trying to quit today 02/05/14  . Alcohol Use: No      Comment: quit drugs 2011  marjuana   OB History    Gravida Para Term Preterm AB TAB SAB Ectopic Multiple Living   2 2 2       2      Review of Systems  Constitutional: Negative for fever.  HENT: Negative for rhinorrhea and sore throat.   Eyes: Negative for visual disturbance.  Respiratory: Negative for chest tightness and shortness of breath.   Cardiovascular: Positive for syncope. Negative for chest pain, palpitations and leg swelling.  Gastrointestinal: Negative for nausea, vomiting, abdominal pain and constipation.  Genitourinary: Negative for dysuria and hematuria.  Musculoskeletal: Positive for neck pain. Negative for back pain.  Skin: Negative for rash.  Neurological: Positive for light-headedness and headaches. Negative for dizziness and seizures.  Psychiatric/Behavioral: Negative for confusion.  All other systems reviewed and are negative.   Allergies  Bactrim and Sulfa antibiotics  Home Medications   Prior to Admission medications   Medication Sig Start Date End Date Taking? Authorizing Provider  albuterol (PROVENTIL HFA;VENTOLIN HFA) 108 (90 BASE) MCG/ACT inhaler Inhale 2 puffs into the lungs every 6 (six) hours as needed for wheezing or shortness of breath. 08/13/14  Yes Rebekah Chesterfield  Georgina Snell, MD  aspirin 81 MG chewable tablet Chew 81 mg by mouth daily after lunch.    Yes Historical Provider, MD  atorvastatin (LIPITOR) 80 MG tablet Take 0.5 tablets (40 mg total) by mouth daily. Patient taking differently: Take 40 mg by mouth daily after lunch.  06/22/14  Yes Larey Dresser, MD  citalopram (CELEXA) 20 MG tablet Take 1 tablet (20 mg total) by mouth daily. Patient taking differently: Take 20 mg by mouth at bedtime.  02/01/15  Yes Larey Dresser, MD  clopidogrel (PLAVIX) 75 MG tablet Take 1 tablet (75 mg total) by mouth daily with breakfast. Patient taking differently: Take 75 mg by mouth daily after lunch.  04/24/13  Yes Larey Dresser, MD  clopidogrel (PLAVIX) 75 MG tablet  TAKE ONE TABLET BY MOUTH ONCE DAILY Patient taking differently: TAKE ONE TABLET BY MOUTH ONCE DAILY AFTER LUNCH 02/26/15  Yes Larey Dresser, MD  hydrochlorothiazide (HYDRODIURIL) 25 MG tablet Take 1 tablet (25 mg total) by mouth daily. Patient taking differently: Take 25 mg by mouth daily after lunch.  11/26/14  Yes Larey Dresser, MD  losartan (COZAAR) 100 MG tablet TAKE ONE TABLET BY MOUTH ONCE DAILY Patient taking differently: TAKE ONE TABLET BY MOUTH ONCE DAILY AFTER LUNCH 11/26/14  Yes Larey Dresser, MD  metoprolol tartrate (LOPRESSOR) 25 MG tablet TAKE ONE TABLET BY MOUTH TWICE DAILY 03/16/14  Yes Larey Dresser, MD  nicotine (NICODERM CQ - DOSED IN MG/24 HOURS) 14 mg/24hr patch Place 1 patch onto the skin daily as needed (smoking cessation).  03/28/12  Yes Larey Dresser, MD  polyethylene glycol powder (GLYCOLAX/MIRALAX) powder Take 17 g by mouth 2 (two) times daily as needed. Patient not taking: Reported on 03/03/2015 03/22/13   Reyne Dumas, MD   BP 118/69 mmHg  Pulse 82  Temp(Src) 99.1 F (37.3 C) (Oral)  Resp 20  SpO2 100% Physical Exam  Constitutional: She is oriented to person, place, and time. She appears well-developed and well-nourished. No distress.  HENT:  Head: Normocephalic and atraumatic.  Mouth/Throat: Oropharynx is clear and moist.  Eyes: EOM are normal. Pupils are equal, round, and reactive to light.  Neck: Neck supple. No JVD present.  Cardiovascular: Normal rate, regular rhythm, normal heart sounds and intact distal pulses.  Exam reveals no gallop.   No murmur heard. Pulmonary/Chest: Effort normal and breath sounds normal. She has no wheezes. She has no rales.  Abdominal: Soft. She exhibits no distension. There is no tenderness.  Musculoskeletal: Normal range of motion. She exhibits no tenderness.  Neurological: She is alert and oriented to person, place, and time. She has normal strength. No cranial nerve deficit or sensory deficit. She exhibits normal  muscle tone. Coordination normal. GCS eye subscore is 4. GCS verbal subscore is 5. GCS motor subscore is 6.  Reflex Scores:      Bicep reflexes are 2+ on the right side and 2+ on the left side.      Brachioradialis reflexes are 2+ on the right side and 2+ on the left side.      Patellar reflexes are 2+ on the right side and 2+ on the left side. Skin: Skin is warm and dry. No rash noted.  Psychiatric: Her behavior is normal.    ED Course  Procedures  None   Labs Review Labs Reviewed  BASIC METABOLIC PANEL - Abnormal; Notable for the following:    Potassium 3.1 (*)    Glucose, Bld 126 (*)  Creatinine, Ser 1.21 (*)    GFR calc non Af Amer 47 (*)    GFR calc Af Amer 55 (*)    All other components within normal limits  CBG MONITORING, ED - Abnormal; Notable for the following:    Glucose-Capillary 122 (*)    All other components within normal limits  CBC  TROPONIN I  MAGNESIUM  URINALYSIS, ROUTINE W REFLEX MICROSCOPIC (NOT AT [facility])  URINE RAPID DRUG SCREEN, HOSP PERFORMED  CREATININE, URINE, RANDOM  SODIUM, URINE, RANDOM  HEMOGLOBIN A1C  LIPID PANEL  PROTIME-INR  APTT  I-STAT TROPOININ, ED  Randolm Idol, ED    Imaging Review Dg Chest 2 View  03/03/2015  CLINICAL DATA:  Syncopal episode today. Shortness of breath. History of hypertension and myocardial infarction. EXAM: CHEST  2 VIEW COMPARISON:  03/08/2011 and 02/12/2011 radiographs. FINDINGS: The heart size and mediastinal contours are normal. The lungs are clear. There is no pleural effusion or pneumothorax. No acute osseous findings are identified. There are degenerative changes at both shoulders with possible mild narrowing of the subacromial space. IMPRESSION: No active cardiopulmonary process. Electronically Signed   By: Richardean Sale M.D.   On: 03/03/2015 19:13   Ct Head Wo Contrast  03/03/2015  CLINICAL DATA:  At least two Syncopal episodes in the past 2 weeks. Nausea, dizziness, posterior neck pain. No  current symptoms. Query shaking spell. EXAM: CT HEAD WITHOUT CONTRAST TECHNIQUE: Contiguous axial images were obtained from the base of the skull through the vertex without intravenous contrast. COMPARISON:  MR brain 03/10/2011.  CT head 03/08/2011 FINDINGS: No evidence for acute infarction, hemorrhage, mass lesion, hydrocephalus, or extra-axial fluid. Slight premature for age cerebral atrophy. Hypoattenuation of white matter representing chronic microvascular ischemic change. Evidence for remote deep white matter and deep nuclei lacunar infarcts. The calvarium is intact. Vascular calcifications noted. No layering sinus or mastoid fluid. Negative orbits. Progression of small vessel disease compared with priors. IMPRESSION: No acute intracranial findings.  Chronic changes as described. Electronically Signed   By: Staci Righter M.D.   On: 03/03/2015 20:52   I have personally reviewed and evaluated these images and lab results as part of my medical decision-making.   EKG Interpretation   Date/Time:  Sunday March 03 2015 18:06:36 EST Ventricular Rate:  74 PR Interval:  150 QRS Duration: 72 QT Interval:  406 QTC Calculation: 450 R Axis:   5 Text Interpretation:  Normal sinus rhythm Minimal voltage criteria for  LVH, may be normal variant Borderline ECG Confirmed by ZAVITZ  MD, JOSHUA  ([id number]) on 03/03/2015 7:15:00 PM      MDM   Final diagnoses:  Syncope, unspecified syncope type  Hypokalemia  AKI (acute kidney injury) ([place])    59 yo AAF with a PMH of CAD s/p MI and stent placement in 2012 on plavix, HTN, tobacco abuse, and CVA in 2012 who presents with syncope. Patient and her daughter provide the history. Patient states she was standing in her kitchen when she began to have some pain in the back of her neck and felt a little nauseated and then she felt weak in her legs are lowered herself to the floor. She did not collapse or hit her head on the ground. Family was around and was able to  help her to the ground. Denied chest pain during the event and does not have chest pain now. She denies shortness of breath, headache, blurry vision. She is daughter say she had an episode similar about a week  ago. Patient appears well on exam. Neuro exam is unremarkable. Heart and lung sounds are clear bilaterally. No recent surgery or travel, no leg swelling, no SOB or chest pain, no tachycardia or tachypnea - less concerned for PE. She is afebrile with normal vital signs. We'll obtain labs, troponin, EKG. Patient mentioned possible shaking after the fall. Given the patient's antecedent headache prior to the syncopal episode, and pending her kidney function, we'll obtain CTA of the head to look at the posterior fossa. Anticipate admission for observation.  I independently reviewed the EKG which shows NSR, rate 74, left axis deviation, intervals maintained, no new TWI, no ST elevations or depression, no wellens, no brugada.   Labs show AKI - will give fluids and get noncon CT head. Hypokalemia - will give K phos. Trop negative. CXR unremarkable. Will admit to syncope workup and AKI. Stable for the floor.   Discussed with Dr. Reather Converse.  Gustavus Bryant, MD 03/03/15 [id number]  Elnora Morrison, MD 03/03/15 2158

## 2015-03-03 NOTE — ED Notes (Signed)
Pt still off unit for MRI 

## 2015-03-03 NOTE — Progress Notes (Signed)
Called Bed Placement informing about patient's changed of status and doctor's order to rule out stroke, ED RN adamant to bring pt up here and telling receiving RN that pt is not having a stroke even with the doctor's order. Bed Placement confirmed that pt will be admitted in a neuro floor.

## 2015-03-03 NOTE — Progress Notes (Signed)
Attempted report once. Nurse stated she will call back. Ruben Gottron, RN 03/03/2015 10:26 PM

## 2015-03-03 NOTE — ED Notes (Addendum)
Attempted report x3, RN refused patient after taking report from ED RN. Bed had been assigned for over 40 minutes allowing ample time for admitting RN to review chart and admitting orders before calling ED to get report.

## 2015-03-03 NOTE — ED Notes (Signed)
Pt states she does not feel like she can provide a urine sample at this time

## 2015-03-03 NOTE — H&P (Signed)
[facility] History and Physical  Vanessa Proctor [id number] DOB: 1956/01/12 DOA: 03/03/2015  Referring physician: ED physician PCP: Angelica Chessman, MD  Specialists:   Chief Complaint: syncope and transient mild left sided weakness  HPI: Vanessa Proctor is a 59 y.o. female with PMH of hypertension, hyperlipidemia, stroke, CAD, s/p of stent placement, rectal bleeding, tobacco abuse, tobacco abuse, depression, who presents with syncope and transient and mild left-sided weakness.  Patient reports that at about 4:30, she suddenly started having posterior neck pain and then passed out when she was cooking food in kitchen. She was caught by her grandson, without causing any injury. She also had nausea, pressure-like headache and left sided weakness. All her symptoms resolved after a few minutes. Currently patient does not have neck pain or headache. She does not have chest pain, shortness of breath, vision change, hearing loss or tingling sensations in her extremities. She reports that she had a similar episode 2 weeks ago and did not seek for treatment. She has chronic mild dry cough due to smoking, which has not changed. Patient does not have abdominal pain, diarrhea, symptoms of UTI. She states that she used marijuana today.  In ED, patient was found to have negative CT-head for acute abnormalities, potassium is 3.1, AKI, negative troponin, urinalysis pending, WBC 6.6, temperature 99.1, no tachycardia, negative chest x-ray. Patient's admitted to inpatient for further evaluated and treatment.  Where does patient live?   At home  Can patient participate in ADLs?  Yes     Review of Systems:   General: no fevers, chills, no changes in body weight, has fatigue HEENT: no blurry vision, hearing changes or sore throat Pulm: no dyspnea, has coughing, no wheezing CV: no chest pain, palpitations Abd: has nausea, no vomiting, abdominal pain, diarrhea, constipation GU: no dysuria,  burning on urination, increased urinary frequency, hematuria  Ext: no leg edema Neuro: has left sided unilateral weakness, no numbness, or tingling, no vision change or hearing loss Skin: no rash MSK: No muscle spasm, no deformity, no limitation of range of movement in spin Heme: No easy bruising.  Travel history: No recent long distant travel.  Allergy:  Allergies  Allergen Reactions  . Bactrim Rash  . Sulfa Antibiotics Rash    Past Medical History  Diagnosis Date  . Hypertension   . Depression   . Myocardial infarction [facility])     november 1st 2012  . Coronary artery disease   . Stroke [facility])     november, 2012  . Arthritis     knees  . Nausea & vomiting last month  . Constipation last month  . Rectal bleeding   . Tobacco abuse   . Drug abuse     Past Surgical History  Procedure Laterality Date  . Abdominal hysterectomy  1985    partial  . Coronary angioplasty with stent placement  2012    x 1  . Tonsillectomy  1980  . Knot drained and removed from side of face  2007  . Finger fracture surgery Left     reduction with  splint  . Colonoscopy N/A 04/28/2013    Procedure: COLONOSCOPY;  Surgeon: Beryle Beams, MD;  Location: [facility];  Service: Endoscopy;  Laterality: N/A;    Social History:  reports that she has been smoking Cigarettes.  She has a 10 pack-year smoking history. She has never used smokeless tobacco. She reports that she uses illicit drugs (Marijuana). She reports that she does not drink alcohol.  Family History:  Family History  Problem Relation Age of Onset  . Coronary artery disease    . Cancer Mother     mass in abdomin  . Pancreatic cancer Sister   . Stroke Brother   . Heart attack Maternal Grandfather   . Hypertension Maternal Grandfather      Prior to Admission medications   Medication Sig Start Date End Date Taking? Authorizing Provider  albuterol (PROVENTIL HFA;VENTOLIN HFA) 108 (90 BASE) MCG/ACT inhaler Inhale 2 puffs into the  lungs every 6 (six) hours as needed for wheezing or shortness of breath. 08/13/14  Yes Gregor Hams, MD  aspirin 81 MG chewable tablet Chew 81 mg by mouth daily after lunch.    Yes Historical Provider, MD  atorvastatin (LIPITOR) 80 MG tablet Take 0.5 tablets (40 mg total) by mouth daily. Patient taking differently: Take 40 mg by mouth daily after lunch.  06/22/14  Yes Larey Dresser, MD  citalopram (CELEXA) 20 MG tablet Take 1 tablet (20 mg total) by mouth daily. Patient taking differently: Take 20 mg by mouth at bedtime.  02/01/15  Yes Larey Dresser, MD  clopidogrel (PLAVIX) 75 MG tablet Take 1 tablet (75 mg total) by mouth daily with breakfast. Patient taking differently: Take 75 mg by mouth daily after lunch.  04/24/13  Yes Larey Dresser, MD  clopidogrel (PLAVIX) 75 MG tablet TAKE ONE TABLET BY MOUTH ONCE DAILY Patient taking differently: TAKE ONE TABLET BY MOUTH ONCE DAILY AFTER LUNCH 02/26/15  Yes Larey Dresser, MD  hydrochlorothiazide (HYDRODIURIL) 25 MG tablet Take 1 tablet (25 mg total) by mouth daily. Patient taking differently: Take 25 mg by mouth daily after lunch.  11/26/14  Yes Larey Dresser, MD  losartan (COZAAR) 100 MG tablet TAKE ONE TABLET BY MOUTH ONCE DAILY Patient taking differently: TAKE ONE TABLET BY MOUTH ONCE DAILY AFTER LUNCH 11/26/14  Yes Larey Dresser, MD  metoprolol tartrate (LOPRESSOR) 25 MG tablet TAKE ONE TABLET BY MOUTH TWICE DAILY 03/16/14  Yes Larey Dresser, MD  nicotine (NICODERM CQ - DOSED IN MG/24 HOURS) 14 mg/24hr patch Place 1 patch onto the skin daily as needed (smoking cessation).  03/28/12  Yes Larey Dresser, MD  polyethylene glycol powder (GLYCOLAX/MIRALAX) powder Take 17 g by mouth 2 (two) times daily as needed. Patient not taking: Reported on 03/03/2015 03/22/13   Reyne Dumas, MD    Physical Exam: Filed Vitals:   03/03/15 2045 03/03/15 2100 03/03/15 2115 03/03/15 2130  BP: 134/69 139/71 153/110 143/76  Pulse: 73 70 76 69  Temp:       TempSrc:      Resp:      SpO2: 100% 100% 100% 100%   General: Not in acute distress HEENT:       Eyes: PERRL, EOMI, no scleral icterus.       ENT: No discharge from the ears and nose, no pharynx injection, no tonsillar enlargement.        Neck: No JVD, no bruit, no mass felt. Heme: No neck lymph node enlargement. Cardiac: S1/S2, RRR, No murmurs, No gallops or rubs. Pulm:  No rales, wheezing, rhonchi or rubs. Abd: Soft, nondistended, nontender, no rebound pain, no organomegaly, BS present. Ext: No pitting leg edema bilaterally. 2+DP/PT pulse bilaterally. Musculoskeletal: No joint deformities, No joint redness or warmth, no limitation of ROM in spin. Skin: No rashes.  Neuro: Alert, oriented X3, cranial nerves II-XII grossly intact, muscle strength 5/5 in all extremities, sensation to light  touch intact. Brachial reflex 1+ bilaterally. Knee reflex 1+ bilaterally. Negative Babinski's sign. Normal finger to nose test. Psych: Patient is not psychotic, no suicidal or hemocidal ideation.  Labs on Admission:  Basic Metabolic Panel:  Recent Labs Lab 03/03/15 1819 03/03/15 1947  NA 139  --   K 3.1*  --   CL 102  --   CO2 28  --   GLUCOSE 126*  --   BUN 14  --   CREATININE 1.21*  --   CALCIUM 10.2  --   MG  --  2.1   Liver Function Tests: No results for input(s): AST, ALT, ALKPHOS, BILITOT, PROT, ALBUMIN in the last 168 hours. No results for input(s): LIPASE, AMYLASE in the last 168 hours. No results for input(s): AMMONIA in the last 168 hours. CBC:  Recent Labs Lab 03/03/15 1819  WBC 6.6  HGB 13.4  HCT 40.5  MCV 86.9  PLT 250   Cardiac Enzymes:  Recent Labs Lab 03/03/15 1947  TROPONINI <0.03    BNP (last 3 results) No results for input(s): BNP in the last 8760 hours.  ProBNP (last 3 results) No results for input(s): PROBNP in the last 8760 hours.  CBG:  Recent Labs Lab 03/03/15 1824  GLUCAP 122*    Radiological Exams on Admission: Dg Chest 2  View  03/03/2015  CLINICAL DATA:  Syncopal episode today. Shortness of breath. History of hypertension and myocardial infarction. EXAM: CHEST  2 VIEW COMPARISON:  03/08/2011 and 02/12/2011 radiographs. FINDINGS: The heart size and mediastinal contours are normal. The lungs are clear. There is no pleural effusion or pneumothorax. No acute osseous findings are identified. There are degenerative changes at both shoulders with possible mild narrowing of the subacromial space. IMPRESSION: No active cardiopulmonary process. Electronically Signed   By: Richardean Sale M.D.   On: 03/03/2015 19:13   Ct Head Wo Contrast  03/03/2015  CLINICAL DATA:  At least two Syncopal episodes in the past 2 weeks. Nausea, dizziness, posterior neck pain. No current symptoms. Query shaking spell. EXAM: CT HEAD WITHOUT CONTRAST TECHNIQUE: Contiguous axial images were obtained from the base of the skull through the vertex without intravenous contrast. COMPARISON:  MR brain 03/10/2011.  CT head 03/08/2011 FINDINGS: No evidence for acute infarction, hemorrhage, mass lesion, hydrocephalus, or extra-axial fluid. Slight premature for age cerebral atrophy. Hypoattenuation of white matter representing chronic microvascular ischemic change. Evidence for remote deep white matter and deep nuclei lacunar infarcts. The calvarium is intact. Vascular calcifications noted. No layering sinus or mastoid fluid. Negative orbits. Progression of small vessel disease compared with priors. IMPRESSION: No acute intracranial findings.  Chronic changes as described. Electronically Signed   By: Staci Righter M.D.   On: 03/03/2015 20:52    EKG: Independently reviewed. QTC 450, LVH, mild T-wave flattening  Assessment/Plan Principal Problem:   Syncope Active Problems:   TOBACCO ABUSE   Depression   HYPERTENSION, BENIGN ESSENTIAL   CVA (cerebral infarction)   CAD (coronary artery disease)   Hyperlipidemia   AKI (acute kidney injury) ([facility])    Hypokalemia   Drug abuse  Syncope: Etiology is not clear. A potential differential diagnosis is TIA/stroke given transient left sided weakness. Other differential diagnoses include, but less likely, ACS (no chest pain) and pulmonary embolism (no chest pain or shortness of breath). Drug abuse may have also contributed. Will admit and do stroke work up.  - will admit to tele bed - Risk factor modification: HgbA1c, fasting lipid panel  - MRI,  MRA of the brain without contrast  - PT consult, OT consult, Speech consult  - 2 d Echocardiogram  - Ekg  - Carotid dopplers  - Aspirin and plavix - check UDS - ortho static vital sign  Hx of stroke: -On ASA and plavix -f/u stroke work up as above  Tobacco abuse and drug abuse: -Did counseling about importance of quitting smoking and drug -Nicotine patch  Depression and anxiety: Stable, no suicidal or homicidal ideations. -Continue home medications: Celexa  HTN: -Hold HCTZ and losartan due to AKI. -Hydralazine when necessary -On metoprolol  CAD (coronary artery disease): s/p of Stent. No CP now. -continue metoprolol, aspirin and Plavix  HLD: Last LDL was not on record -Continue home medications: Lipitor -Check FLP  Hypokalemia: K= 3.1on admission. Magnesium level normal, 2.1 - Repleted  AKI: Likely due to prerenal secondary to dehydration and continuation of ARB, diruetics - IVF: NS 1L and then 75 cc/h - Check FeNa - US-renal - Follow up renal function by BMP - Hold HCTZ and her losartan    DVT ppx: SQ Heparin  Code Status: Full code Family Communication:  Yes, patient's daughter at bed side Disposition Plan: Admit to inpatient   Date of Service 03/03/2015    Ivor Costa [facility] Pager [phone]  If 7PM-7AM, please contact night-coverage [email or web address] Password [facility] 03/03/2015, 9:53 PM

## 2015-03-03 NOTE — ED Notes (Signed)
Patient transported to MRI 

## 2015-03-04 ENCOUNTER — Observation Stay (HOSPITAL_COMMUNITY): Payer: Medicare Other

## 2015-03-04 ENCOUNTER — Inpatient Hospital Stay (HOSPITAL_COMMUNITY): Payer: Medicare Other

## 2015-03-04 ENCOUNTER — Encounter (HOSPITAL_COMMUNITY): Payer: Self-pay | Admitting: *Deleted

## 2015-03-04 ENCOUNTER — Other Ambulatory Visit: Payer: Self-pay | Admitting: Physician Assistant

## 2015-03-04 DIAGNOSIS — R55 Syncope and collapse: Secondary | ICD-10-CM | POA: Diagnosis not present

## 2015-03-04 DIAGNOSIS — G8194 Hemiplegia, unspecified affecting left nondominant side: Secondary | ICD-10-CM | POA: Diagnosis not present

## 2015-03-04 DIAGNOSIS — I10 Essential (primary) hypertension: Secondary | ICD-10-CM | POA: Diagnosis not present

## 2015-03-04 DIAGNOSIS — R51 Headache: Secondary | ICD-10-CM | POA: Diagnosis not present

## 2015-03-04 DIAGNOSIS — I2510 Atherosclerotic heart disease of native coronary artery without angina pectoris: Secondary | ICD-10-CM | POA: Diagnosis not present

## 2015-03-04 DIAGNOSIS — E876 Hypokalemia: Secondary | ICD-10-CM | POA: Diagnosis not present

## 2015-03-04 DIAGNOSIS — N179 Acute kidney failure, unspecified: Secondary | ICD-10-CM | POA: Diagnosis not present

## 2015-03-04 LAB — LIPID PANEL
Cholesterol: 103 mg/dL (ref 0–200)
HDL: 35 mg/dL — ABNORMAL LOW (ref >40)
LDL Cholesterol: 58 mg/dL (ref 0–99)
Total CHOL/HDL Ratio: 2.9 RATIO
Triglycerides: 48 mg/dL (ref <150)
VLDL: 10 mg/dL (ref 0–40)

## 2015-03-04 LAB — URINALYSIS, ROUTINE W REFLEX MICROSCOPIC
Bilirubin Urine: NEGATIVE
Glucose, UA: NEGATIVE mg/dL
Hgb urine dipstick: NEGATIVE
Ketones, ur: NEGATIVE mg/dL
Leukocytes, UA: NEGATIVE
Nitrite: NEGATIVE
Protein, ur: NEGATIVE mg/dL
Specific Gravity, Urine: 1.014 (ref 1.005–1.030)
pH: 7 (ref 5.0–8.0)

## 2015-03-04 LAB — RAPID URINE DRUG SCREEN, HOSP PERFORMED
Amphetamines: NOT DETECTED
Barbiturates: NOT DETECTED
Benzodiazepines: NOT DETECTED
Cocaine: NOT DETECTED
Opiates: NOT DETECTED
Tetrahydrocannabinol: POSITIVE — AB

## 2015-03-04 LAB — SODIUM, URINE, RANDOM: Sodium, Ur: 151 mmol/L

## 2015-03-04 LAB — CREATININE, URINE, RANDOM: Creatinine, Urine: 84.35 mg/dL

## 2015-03-04 MED ORDER — HYDROCHLOROTHIAZIDE 25 MG PO TABS: 12.5000 mg | ORAL_TABLET | Freq: Every day | ORAL | Status: DC

## 2015-03-04 MED ORDER — ALBUTEROL SULFATE (2.5 MG/3ML) 0.083% IN NEBU: 2.5000 mg | INHALATION_SOLUTION | Freq: Four times a day (QID) | RESPIRATORY_TRACT | Status: DC | PRN

## 2015-03-04 NOTE — Evaluation (Signed)
Physical Therapy Evaluation Patient Details Name: Vanessa Proctor MRN: [id number] DOB: 1956-03-14 Today's Date: 03/04/2015   History of Present Illness  HPI: Vanessa Proctor is a 59 y.o. female with PMH of hypertension, hyperlipidemia, stroke, CAD, s/p of stent placement, rectal bleeding, tobacco abuse, tobacco abuse, depression, who presents with syncope and transient and mild left-sided weakness.  Clinical Impression   Pt admitted with above diagnosis. Pt currently with functional limitations due to the deficits listed below (see PT Problem List).  Pt will benefit from skilled PT to increase their independence and safety with mobility to allow discharge to the venue listed below.       Follow Up Recommendations Outpatient PT;Supervision - Intermittent (can 24 hour assist be arranged initially?)    Equipment Recommendations  None recommended by PT (pt to use her own cane prn)    Recommendations for Other Services OT consult     Precautions / Restrictions Precautions Precautions: Fall      Mobility  Bed Mobility Overal bed mobility: Modified Independent                Transfers Overall transfer level: Needs assistance Equipment used: None Transfers: Sit to/from Stand Sit to Stand: Supervision         General transfer comment: Noted dependence on UEs to push up  Ambulation/Gait Ambulation/Gait assistance: Min guard;Supervision Ambulation Distance (Feet): 120 Feet Assistive device:  (pushing IV pole and using hallway rail ) Gait Pattern/deviations: Step-through pattern Gait velocity: quite slow   General Gait Details: very slow gait when pushing IV pole and using hallway rail; Noted smoother gait and steps in room when she was not holding on to anything  Stairs Stairs: Yes Stairs assistance: Min guard Stair Management: One rail Right;Step to pattern;Forwards Number of Stairs: 3 General stair comments: Cues for sequence, especially given L knee  pain with descending; pt reports easier to manage leading eith the L going down  Wheelchair Mobility    Modified Rankin (Stroke Patients Only) Modified Rankin (Stroke Patients Only) Pre-Morbid Rankin Score: No significant disability Modified Rankin: No significant disability     Balance Overall balance assessment: Needs assistance           Standing balance-Leahy Scale: Fair                               Pertinent Vitals/Pain Pain Assessment: Faces Faces Pain Scale: Hurts a little bit Pain Location: L knee with steps Pain Descriptors / Indicators:  (transient) Pain Intervention(s): Other (comment) (instructed pt to lead with LLE when descending steps)    Home Living Family/patient expects to be discharged to:: Private residence Living Arrangements: Other relatives Available Help at Discharge: Family Type of Home: House Home Access: Stairs to enter Entrance Stairs-Rails: Right Entrance Stairs-Number of Steps:  (4 on walkway to home, then 4-6 more to get in) Home Layout: One level        Prior Function Level of Independence: Independent with assistive device(s)         Comments: Cane from time to time; 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     Hand Dominance        Extremity/Trunk Assessment   Upper Extremity Assessment: Overall WFL for tasks assessed           Lower Extremity Assessment: Generalized weakness;LLE deficits/detail   LLE Deficits / Details: Reports transient L knee pain with  walking and with steps; ROM and strenght WFL for simple mobility tasks     Communication   Communication: No difficulties  Cognition Arousal/Alertness: Awake/alert Behavior During Therapy: WFL for tasks assessed/performed Overall Cognitive Status: History of cognitive impairments - at baseline (will defer to SLP evaluation)                      General Comments      Exercises        Assessment/Plan    PT  Assessment Patient needs continued PT services  PT Diagnosis Difficulty walking;Generalized weakness   PT Problem List Decreased strength;Decreased activity tolerance;Decreased balance;Decreased mobility;Decreased knowledge of use of DME;Pain  PT Treatment Interventions DME instruction;Gait training;Stair training;Functional mobility training;Therapeutic activities;Therapeutic exercise;Patient/family education   PT Goals (Current goals can be found in the Care Plan section) Acute Rehab PT Goals Patient Stated Goal: wants to feel back to normal PT Goal Formulation: With patient Time For Goal Achievement: 03/11/15    Frequency Min 3X/week   Barriers to discharge        Co-evaluation               End of Session Equipment Utilized During Treatment: Gait belt Activity Tolerance: Patient tolerated treatment well Patient left: in chair;with call bell/phone within reach;with chair alarm set Nurse Communication: Mobility status         Time: 1138-1200 PT Time Calculation (min) (ACUTE ONLY): 22 min   Charges:   PT Evaluation $Initial PT Evaluation Tier I: 1 Procedure     PT G CodesRoney Marion Hamff 03/04/2015, 1:04 PM  Roney Marion, PT  Acute Rehabilitation Services Pager [phone] Office [phone]

## 2015-03-04 NOTE — Progress Notes (Signed)
   03/04/15 1117  Clinical Encounter Type  Visited With Patient and family together;Health care provider  Visit Type Initial  Referral From Patient  Spiritual Encounters  Spiritual Needs Literature;Prayer   Chaplain responded to a request to visit with the patient's family and to offer information about advanced directives. Chaplain gave patient the form, and she will look over it. Family also requested prayer for family relationships. Chaplain prayed and extended hospitality as well. Chaplain support available as needed.   Jeri Lager, Chaplain 03/04/2015 11:19 AM

## 2015-03-04 NOTE — Progress Notes (Signed)
  Echocardiogram 2D Echocardiogram has been performed.  Vanessa Proctor 03/04/2015, 10:01 AM

## 2015-03-04 NOTE — Progress Notes (Signed)
Pt for discharge home today. Discharge orders received. IV and telemetry dcd with dressing clean dry and intact to lower back. Discharge instructions and prescriptions given with verbalized understanding. Family at bedside to assist with discharge. Staff brought patient to lobby via wheelchair [phone] Transported to home by family member.
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
## 2015-03-04 NOTE — Progress Notes (Signed)
Patient arrived via ED tech. Oriented to the room and equipment. Will continue to monitor. Joaquin Bend E, RN 03/04/2015 1:14 AM

## 2015-03-04 NOTE — Evaluation (Signed)
Clinical/Bedside Swallow Evaluation Patient Details  Name: Vanessa Proctor MRN: [id number] Date of Birth: 1955-10-11  Today's Date: 03/04/2015 Time: SLP Start Time (ACUTE ONLY): [id number] SLP Stop Time (ACUTE ONLY): 1050 SLP Time Calculation (min) (ACUTE ONLY): 18 min  Past Medical History:  Past Medical History  Diagnosis Date  . Hypertension   . Depression   . Myocardial infarction [facility])     november 1st 2012  . Coronary artery disease   . Stroke [facility])     november, 2012  . Arthritis     knees  . Nausea & vomiting last month  . Constipation last month  . Rectal bleeding   . Tobacco abuse   . Drug abuse    Past Surgical History:  Past Surgical History  Procedure Laterality Date  . Abdominal hysterectomy  1985    partial  . Coronary angioplasty with stent placement  2012    x 1  . Tonsillectomy  1980  . Knot drained and removed from side of face  2007  . Finger fracture surgery Left     reduction with  splint  . Colonoscopy N/A 04/28/2013    Procedure: COLONOSCOPY;  Surgeon: Beryle Beams, MD;  Location: [facility];  Service: Endoscopy;  Laterality: N/A;   HPI:  Vanessa Proctor is a 59 y.o. female with PMH of hypertension, hyperlipidemia, stroke, CAD, s/p of stent placement, rectal bleeding, tobacco abuse, tobacco abuse, depression, who presents with syncope and transient and mild left-sided weakness.   Assessment / Plan / Recommendation Clinical Impression  Pt's oropharyngeal swallow appears WFL given lack of dentition. Would continue with regular textures and thin liquids - no SLP f/u indicated.    Aspiration Risk  Mild aspiration risk    Diet Recommendation  Regular textures, thin liquids   Medication Administration: Whole meds with liquid    Other  Recommendations Oral Care Recommendations: Oral care BID   Follow up Recommendations  None        Swallow Study   General Date of Onset: 03/04/15 HPI: Vanessa Proctor is a 59 y.o. female with PMH  of hypertension, hyperlipidemia, stroke, CAD, s/p of stent placement, rectal bleeding, tobacco abuse, tobacco abuse, depression, who presents with syncope and transient and mild left-sided weakness. Type of Study: Bedside Swallow Evaluation Previous Swallow Assessment: none in chart Diet Prior to this Study: Regular;Thin liquids Temperature Spikes Noted: No Respiratory Status: Room air History of Recent Intubation: No Behavior/Cognition: Alert;Cooperative;Pleasant mood;Requires cueing Oral Cavity Assessment: Within Functional Limits Oral Care Completed by SLP: No Oral Cavity - Dentition: Edentulous;Dentures, not available Vision: Functional for self-feeding Self-Feeding Abilities: Able to feed self Patient Positioning: Upright in bed Baseline Vocal Quality: Normal Volitional Cough: Strong Volitional Swallow: Able to elicit    Oral/Motor/Sensory Function Overall Oral Motor/Sensory Function: Within functional limits   Ice Chips Ice chips: Not tested   Thin Liquid Thin Liquid: Within functional limits Presentation: Cup;Self Fed;Straw    Nectar Thick Nectar Thick Liquid: Not tested   Honey Thick Honey Thick Liquid: Not tested   Puree Puree: Within functional limits Presentation: Self Fed;Spoon   Solid Solid: Within functional limits Presentation: Self Fed      Germain Osgood, M.A. CCC-SLP [phone]  Germain Osgood 03/04/2015,11:10 AM

## 2015-03-04 NOTE — Discharge Summary (Signed)
[facility]  Physician Discharge Summary   Patient ID: Vanessa Proctor MRN: [id number] DOB/AGE: [birth year] 61 y.o.  Admit date: 03/03/2015 Discharge date: 03/04/2015  PCP: Angelica Chessman, MD  DISCHARGE DIAGNOSES:  Principal Problem:   Syncope Active Problems:   TOBACCO ABUSE   Depression   HYPERTENSION, BENIGN ESSENTIAL   CVA (cerebral infarction)   CAD (coronary artery disease)   Hyperlipidemia   AKI (acute kidney injury) ([place])   Hypokalemia   Drug abuse   RECOMMENDATIONS FOR OUTPATIENT FOLLOW UP: 1. Cardiology to arrange event monitor   DISCHARGE CONDITION: fair  Diet recommendation: Heart healthy  Filed Weights   03/04/15 0100  Weight: 75.66 kg (166 lb 12.8 oz)    INITIAL HISTORY: 59 year old African-American female with a past medical history of hypertension, hyperlipidemia, coronary artery disease presented with syncope with transient left-sided weakness. This was her second episode in the last 2 weeks. She was admitted for further management.  Consultations:  Cardiology  Procedures: 2-D echocardiogram Study Conclusions - Left ventricle: The cavity size was normal. Wall thickness wasnormal. Systolic function was normal. The estimated ejectionfraction was in the range of 60% to 65%. Wall motion was normal;there were no regional wall motion abnormalities. Leftventricular diastolic function parameters were normal. - Aortic valve: There was no stenosis. - Mitral valve: There was trivial regurgitation. - Right ventricle: The cavity size was normal. Systolic functionwas normal. - Tricuspid valve: Peak RV-RA gradient (S): 20 mm Hg. - Pulmonary arteries: PA peak pressure: 23 mm Hg (S). - Inferior vena cava: The vessel was normal in size. Therespirophasic diameter changes were in the normal range (>= 50%),consistent with normal central venous pressure. Impressions: - Normal LV size and systolic function, EF [id number]. Normal  diastolicfunction. Normal RV size and systolic function. No significantvalvular abnormalities.  Carotid Dopplers Preliminary report: Bilateral: 1-39% ICA stenosis. Vertebral artery flow is antegrade.    HOSPITAL COURSE:   Syncope Etiology is unclear but could be neurocardiogenic. There was concern about possible TIA versus stroke. This is due to her transient left-sided weakness. Patient underwent MRI brain which did not show any stroke. Carotid Dopplers did not show any significant stenosis. Echocardiogram as above and without any significant abnormalities. Continue aspirin and Plavix. 30 day event monitor to be arranged as outpatient. Patient was seen by physical therapy. Home health will be ordered. Of note, EKG showed sinus rhythm. There was no evidence for UTI. Chest x-ray did not reveal any infiltrates.  Hx of stroke No stroke detected on MRI. Old strokes were noted. Continue aspirin and Plavix.   Tobacco abuse and marijuana abuse: Did counseling about importance of quitting smoking and drug.   Depression and anxiety Stable, no suicidal or homicidal ideations. Continue home medications.  Essential hypertension Depression, stable. Dose of HCTZ has been reduced by half. Continue other medications as before.  CAD (coronary artery disease) Stable. Continue medical management.  HLD Continue home medications: Lipitor. LDL 58.  Hypokalemia K= 3.1 on admission. Magnesium level normal, 2.1. Repleted  Mild AKI Likely due to mild dehydration and continuation of ARB, diruetics. Dose of HCTZ, but has been reduced by half. She may continue her ARB. Renal ultrasound did not show any concerning findings.  Overall, stable. Discussed in detail with the daughter. Okay for discharge today.   PERTINENT LABS:  The results of significant diagnostics from this hospitalization (including imaging, microbiology, ancillary and laboratory) are listed below for reference.     Labs: Basic  Metabolic Panel:  Recent Labs Lab 03/03/15 1819  03/03/15 1947  NA 139  --   K 3.1*  --   CL 102  --   CO2 28  --   GLUCOSE 126*  --   BUN 14  --   CREATININE 1.21*  --   CALCIUM 10.2  --   MG  --  2.1   CBC:  Recent Labs Lab 03/03/15 1819  WBC 6.6  HGB 13.4  HCT 40.5  MCV 86.9  PLT 250   Cardiac Enzymes:  Recent Labs Lab 03/03/15 1947  TROPONINI <0.03   CBG:  Recent Labs Lab 03/03/15 1824  GLUCAP 122*     IMAGING STUDIES Dg Chest 2 View  03/03/2015  CLINICAL DATA:  Syncopal episode today. Shortness of breath. History of hypertension and myocardial infarction. EXAM: CHEST  2 VIEW COMPARISON:  03/08/2011 and 02/12/2011 radiographs. FINDINGS: The heart size and mediastinal contours are normal. The lungs are clear. There is no pleural effusion or pneumothorax. No acute osseous findings are identified. There are degenerative changes at both shoulders with possible mild narrowing of the subacromial space. IMPRESSION: No active cardiopulmonary process. Electronically Signed   By: Richardean Sale M.D.   On: 03/03/2015 19:13   Dg Forearm Left  02/18/2015  CLINICAL DATA:  [place] last Thursday and injured left forearm. Large bruise. EXAM: LEFT FOREARM - 2 VIEW COMPARISON:  None. FINDINGS: The wrist and elbow joints are maintained. No acute forearm fracture is identified. There is a prominent radial tuberosity. This could be a tuft like process from the biceps tendon or possibly a the osteochondroma. It is likely incidental. IMPRESSION: No acute forearm fracture. Electronically Signed   By: Marijo Sanes M.D.   On: 02/18/2015 16:37   Ct Head Wo Contrast  03/03/2015  CLINICAL DATA:  At least two Syncopal episodes in the past 2 weeks. Nausea, dizziness, posterior neck pain. No current symptoms. Query shaking spell. EXAM: CT HEAD WITHOUT CONTRAST TECHNIQUE: Contiguous axial images were obtained from the base of the skull through the vertex without intravenous contrast. COMPARISON:   MR brain 03/10/2011.  CT head 03/08/2011 FINDINGS: No evidence for acute infarction, hemorrhage, mass lesion, hydrocephalus, or extra-axial fluid. Slight premature for age cerebral atrophy. Hypoattenuation of white matter representing chronic microvascular ischemic change. Evidence for remote deep white matter and deep nuclei lacunar infarcts. The calvarium is intact. Vascular calcifications noted. No layering sinus or mastoid fluid. Negative orbits. Progression of small vessel disease compared with priors. IMPRESSION: No acute intracranial findings.  Chronic changes as described. Electronically Signed   By: Staci Righter M.D.   On: 03/03/2015 20:52   Mr Brain Wo Contrast  03/04/2015  CLINICAL DATA:  Syncope, mild LEFT-sided weakness beginning at 4:30 p.m. hours, with posterior neck pain. Syncopal episode while cooking. Nausea with pressure like headache, symptoms now resolved. History of hypertension, hyperlipidemia, stroke. EXAM: MRI HEAD WITHOUT CONTRAST MRA HEAD WITHOUT CONTRAST TECHNIQUE: Multiplanar, multiecho pulse sequences of the brain and surrounding structures were obtained without intravenous contrast. Angiographic images of the head were obtained using MRA technique without contrast. COMPARISON:  CT head March 03, 2015 and MRI brain March 10, 2011 FINDINGS: MRI HEAD FINDINGS No reduced diffusion to suggest acute ischemia. Faint susceptibility RIGHT basal ganglia corresponding to old RIGHT basal ganglia to corona radiata lacunar infarct. Old LEFT thalamus lacunar infarct. Old small LEFT pontine infarct. Patchy supratentorial white matter T2 hyperintensities compatible chronic small vessel ischemic disease, unchanged. No midline shift, mass effect, mass lesions. No abnormal extra-axial fluid collections. No extra-axial  masses though, contrast enhanced sequences would be more sensitive. Normal major intracranial vascular flow voids seen at the skull base. Ocular globes and orbital contents are  unremarkable though not tailored for evaluation. No abnormal sellar expansion. Visualized paranasal sinuses and mastoid air cells are well-aerated. No suspicious calvarial bone marrow signal. Craniocervical junction maintained. Patient is edentulous. MRA HEAD FINDINGS Anterior circulation: Flow related enhancement of the included cervical, petrous, cavernous and supra clinoid internal carotid arteries. Mild luminal irregularity of the para clinoid internal carotid arteries corresponding to calcific atherosclerosis on today's.Patent anterior communicating artery. Normal flow related enhancement of the anterior and middle cerebral arteries, including more distal segments. No large vessel occlusion, high-grade stenosis, abnormal luminal irregularity, aneurysm. Posterior circulation: Codominant vertebral arteries. Basilar artery is patent, with normal flow related enhancement of the main branch vessels. Normal flow related enhancement of the posterior cerebral arteries. No large vessel occlusion, high-grade stenosis, abnormal luminal irregularity, aneurysm. IMPRESSION: MRI HEAD: No acute intracranial process, specifically no acute ischemia. Chronic changes including old RIGHT basal basal ganglia and LEFT thalamus infarcts. Moderate chronic small vessel ischemic disease. MRA HEAD: No acute large vessel occlusion or high-grade stenosis. Mild stenosis of the paraclinoid internal carotid arteries associated with calcific atherosclerosis. Electronically Signed   By: Elon Alas M.D.   On: 03/04/2015 00:45   US Renal  03/04/2015  CLINICAL DATA:  Acute kidney injury. EXAM: RENAL / URINARY TRACT ULTRASOUND COMPLETE COMPARISON:  None. FINDINGS: Right Kidney: Length: 9.3 cm. Echogenicity within normal limits. No mass or hydronephrosis visualized. Left Kidney: Length: 9.4 cm. Echogenicity within normal limits. No mass or hydronephrosis visualized. Bladder: Normal distention. No filling defect or wall thickening  appreciated. IMPRESSION: Normal ultrasound appearance of kidneys and bladder. No hydronephrosis. Electronically Signed   By: Lucienne Capers M.D.   On: 03/04/2015 00:36   Mr Jodene Nam Head/brain Wo Cm  03/04/2015  CLINICAL DATA:  Syncope, mild LEFT-sided weakness beginning at 4:30 p.m. hours, with posterior neck pain. Syncopal episode while cooking. Nausea with pressure like headache, symptoms now resolved. History of hypertension, hyperlipidemia, stroke. EXAM: MRI HEAD WITHOUT CONTRAST MRA HEAD WITHOUT CONTRAST TECHNIQUE: Multiplanar, multiecho pulse sequences of the brain and surrounding structures were obtained without intravenous contrast. Angiographic images of the head were obtained using MRA technique without contrast. COMPARISON:  CT head March 03, 2015 and MRI brain March 10, 2011 FINDINGS: MRI HEAD FINDINGS No reduced diffusion to suggest acute ischemia. Faint susceptibility RIGHT basal ganglia corresponding to old RIGHT basal ganglia to corona radiata lacunar infarct. Old LEFT thalamus lacunar infarct. Old small LEFT pontine infarct. Patchy supratentorial white matter T2 hyperintensities compatible chronic small vessel ischemic disease, unchanged. No midline shift, mass effect, mass lesions. No abnormal extra-axial fluid collections. No extra-axial masses though, contrast enhanced sequences would be more sensitive. Normal major intracranial vascular flow voids seen at the skull base. Ocular globes and orbital contents are unremarkable though not tailored for evaluation. No abnormal sellar expansion. Visualized paranasal sinuses and mastoid air cells are well-aerated. No suspicious calvarial bone marrow signal. Craniocervical junction maintained. Patient is edentulous. MRA HEAD FINDINGS Anterior circulation: Flow related enhancement of the included cervical, petrous, cavernous and supra clinoid internal carotid arteries. Mild luminal irregularity of the para clinoid internal carotid arteries  corresponding to calcific atherosclerosis on today's.Patent anterior communicating artery. Normal flow related enhancement of the anterior and middle cerebral arteries, including more distal segments. No large vessel occlusion, high-grade stenosis, abnormal luminal irregularity, aneurysm. Posterior circulation: Codominant vertebral arteries. Basilar artery is patent,  with normal flow related enhancement of the main branch vessels. Normal flow related enhancement of the posterior cerebral arteries. No large vessel occlusion, high-grade stenosis, abnormal luminal irregularity, aneurysm. IMPRESSION: MRI HEAD: No acute intracranial process, specifically no acute ischemia. Chronic changes including old RIGHT basal basal ganglia and LEFT thalamus infarcts. Moderate chronic small vessel ischemic disease. MRA HEAD: No acute large vessel occlusion or high-grade stenosis. Mild stenosis of the paraclinoid internal carotid arteries associated with calcific atherosclerosis. Electronically Signed   By: Elon Alas M.D.   On: 03/04/2015 00:45    DISCHARGE EXAMINATION: Filed Vitals:   03/04/15 0600 03/04/15 0800 03/04/15 1038 03/04/15 1200  BP: 141/77 153/66 156/77 145/78  Pulse: 58 64 57 56  Temp: 98.2 F (36.8 C) 98.3 F (36.8 C) 98.5 F (36.9 C) 98.5 F (36.9 C)  TempSrc: Oral Oral Oral Oral  Resp: 20  16 14   Height:      Weight:      SpO2: 100% 100% 100% 100%   General appearance: alert, cooperative, appears stated age and no distress Resp: clear to auscultation bilaterally Cardio: regular rate and rhythm, S1, S2 normal, no murmur, click, rub or gallop GI: soft, non-tender; bowel sounds normal; no masses,  no organomegaly Extremities: extremities normal, atraumatic, no cyanosis or edema  DISPOSITION: Home with home health  Discharge Instructions    Call MD for:  difficulty breathing, headache or visual disturbances    Complete by:  As directed      Call MD for:  extreme fatigue    Complete  by:  As directed      Call MD for:  persistant dizziness or light-headedness    Complete by:  As directed      Call MD for:  persistant nausea and vomiting    Complete by:  As directed      Call MD for:  severe uncontrolled pain    Complete by:  As directed      Diet - low sodium heart healthy    Complete by:  As directed      Discharge instructions    Complete by:  As directed   Cardiology has arranged follow-up as well as an event monitor which will need to be picked up from their office. She is to see the follow-up instructions. Patient will benefit from having intermittent supervision at home, around-the-clock. Family to look into this. Home health will be arranged.  You were cared for by a hospitalist during your hospital stay. If you have any questions about your discharge medications or the care you received while you were in the hospital after you are discharged, you can call the unit and asked to speak with the hospitalist on call if the hospitalist that took care of you is not available. Once you are discharged, your primary care physician will handle any further medical issues. Please note that NO REFILLS for any discharge medications will be authorized once you are discharged, as it is imperative that you return to your primary care physician (or establish a relationship with a primary care physician if you do not have one) for your aftercare needs so that they can reassess your need for medications and monitor your lab values. If you do not have a primary care physician, you can call [phone] for a physician referral.     Increase activity slowly    Complete by:  As directed            ALLERGIES:  Allergies  Allergen  Reactions  . Bactrim Rash  . Sulfa Antibiotics Rash     Current Discharge Medication List    CONTINUE these medications which have CHANGED   Details  hydrochlorothiazide (HYDRODIURIL) 25 MG tablet Take 0.5 tablets (12.5 mg total) by mouth daily. Qty: 30  tablet, Refills: 3      CONTINUE these medications which have NOT CHANGED   Details  albuterol (PROVENTIL HFA;VENTOLIN HFA) 108 (90 BASE) MCG/ACT inhaler Inhale 2 puffs into the lungs every 6 (six) hours as needed for wheezing or shortness of breath. Qty: 1 Inhaler, Refills: 2    aspirin 81 MG chewable tablet Chew 81 mg by mouth daily after lunch.     atorvastatin (LIPITOR) 80 MG tablet Take 0.5 tablets (40 mg total) by mouth daily. Qty: 30 tablet, Refills: 6    citalopram (CELEXA) 20 MG tablet Take 1 tablet (20 mg total) by mouth daily. Qty: 90 tablet, Refills: 3    clopidogrel (PLAVIX) 75 MG tablet Take 1 tablet (75 mg total) by mouth daily with breakfast. Qty: 30 tablet, Refills: 6    losartan (COZAAR) 100 MG tablet TAKE ONE TABLET BY MOUTH ONCE DAILY Qty: 30 tablet, Refills: 3    metoprolol tartrate (LOPRESSOR) 25 MG tablet TAKE ONE TABLET BY MOUTH TWICE DAILY Qty: 60 tablet, Refills: 10    nicotine (NICODERM CQ - DOSED IN MG/24 HOURS) 14 mg/24hr patch Place 1 patch onto the skin daily as needed (smoking cessation).    Associated Diagnoses: Coronary atherosclerosis of native coronary artery    polyethylene glycol powder (GLYCOLAX/MIRALAX) powder Take 17 g by mouth 2 (two) times daily as needed. Qty: 3350 g, Refills: 1       Follow-up Information    Follow up with Ermalinda Barrios, PA-C On 04/01/2015.   Specialty:  Cardiology   Why:  11:45am   Contact information:   [address] [place] [address] [phone]       Follow up with [facility] On 03/11/2015.   Specialty:  Cardiology   Why:  3:30pm. Pick up 30 day event monitor   Contact information:   [address] [place]: 79 minutes  [facility] Pager [phone]  03/04/2015, 3:39 PM

## 2015-03-04 NOTE — Consult Note (Signed)
CARDIOLOGY CONSULT NOTE   Patient ID: Vanessa Proctor MRN: [id number], DOB/AGE: Jan 27, 1956   Admit date: 03/03/2015 Date of Consult: 03/04/2015   Primary Physician: Angelica Chessman, MD Primary Cardiologist: Dr. Aundra Dubin  Pt. Profile  pleasant 59 year old African-American female with past medical history of hypertension, CVA, and NSTEMI in 02/2011 came in with syncope  Problem List  Past Medical History  Diagnosis Date  . Hypertension   . Depression   . Myocardial infarction [facility])     november 1st 2012  . Coronary artery disease   . Stroke [facility])     november, 2012  . Arthritis     knees  . Nausea & vomiting last month  . Constipation last month  . Rectal bleeding   . Tobacco abuse   . Drug abuse     Past Surgical History  Procedure Laterality Date  . Abdominal hysterectomy  1985    partial  . Coronary angioplasty with stent placement  2012    x 1  . Tonsillectomy  1980  . Knot drained and removed from side of face  2007  . Finger fracture surgery Left     reduction with  splint  . Colonoscopy N/A 04/28/2013    Procedure: COLONOSCOPY;  Surgeon: Beryle Beams, MD;  Location: [facility];  Service: Endoscopy;  Laterality: N/A;     Allergies  Allergies  Allergen Reactions  . Bactrim Rash  . Sulfa Antibiotics Rash    HPI   The patient is a pleasant 59 year old African-American female with past medical history of hypertension, CVA, and NSTEMI in 02/2011. According to the patient, she originally came in in 2012 with burping and chest pressure. Cardiac catheterization at the time showed EF 55-60%, basal inferior hypokinesis, 99% mid RCA stenosis treated with DES, 40% OM1. A few weeks later, she came back with facial drooping and diagnosed with CVA. CT of the head was negative at the time. MRI of the head showed acute infarct of right external capsule, moderate stenosis in posterior cerebral arteries bilaterally. Her CVA was felt to be is due to small vessel  disease. She has been doing relatively well since then. Her last Myoview was obtained on 11/28/2014 which showed EF 69%, low risk study, fixed small mild mid anteroseptal perfusion defect likely attenuation. Frequent PVCs with exercise. Her last follow-up with Dr. Aundra Dubin was on 02/01/2015 at which time she was doing well. She has been on hydrochlorothiazide for several years and on losartan for at least one year.   Two weeks ago, her toilet seat was broken, after purchasing new toilet seat, she was about to leave her bedroom when she had anxiety feeling and dropped onto her knee. She was able to grab hold of side of the bed to prevent herself from falling to the floor. She denies loss of consciousness at the time. She denied any prodromal symptoms including dizziness, chest pain, shortness of breath, blurred vision or spinning of the room. The only feeling she had prior to dropping to her knee was a feeling of anxiety. She presented to [facility] on 03/03/2015 after a recurrent episode where she lost consciousness. She was standing in the kitchen talking with family member when she started having posterior neck pain and left-sided weakness. She also described a feeling of her head "swelling up". She closed her eyes and subsequently felt herself going down. She lost consciousness this time. Fortunately she was called by her grandson, thus did not have any injury. Her symptom  resolved after a few minutes without further recurrence. She denied any chest discomfort or shortness breath prior to the onset.  On arrival to the ED, urine drug test was positive for marijuana. Creatinine is mildly elevated at 1.2. Troponin was negative. CBC was normal. CT of the head was negative. MRI of the brain was also negative for acute process. Echocardiogram has been obtained and is currently pending. Chest x-ray is negative. Carotid ultrasound is negative for significant blockage. Given her cardiac history, cardiology has  been consulted for syncope.   Inpatient Medications  . aspirin  81 mg Oral QPC lunch  . atorvastatin  40 mg Oral QPC lunch  . citalopram  20 mg Oral QHS  . clopidogrel  75 mg Oral QPC lunch  . dextromethorphan-guaiFENesin  1 tablet Oral BID  . heparin  5,000 Units Subcutaneous 3 times per day  . metoprolol tartrate  25 mg Oral BID    Family History Family History  Problem Relation Age of Onset  . Coronary artery disease    . Cancer Mother     mass in abdomin  . Pancreatic cancer Sister   . Stroke Brother   . Heart attack Maternal Grandfather   . Hypertension Maternal Grandfather      Social History Social History   Social History  . Marital Status: Single    Spouse Name: N/A  . Number of Children: N/A  . Years of Education: N/A   Occupational History  . Not on file.   Social History Main Topics  . Smoking status: Current Every Day Smoker -- 0.25 packs/day for 40 years    Types: Cigarettes  . Smokeless tobacco: Never Used     Comment: trying to quit today 02/05/14  . Alcohol Use: No     Comment: quit drugs 2011  marjuana  . Drug Use: Yes    Special: Marijuana     Comment: marijuana  . Sexual Activity: Not on file   Other Topics Concern  . Not on file   Social History Narrative     Review of Systems  General:  No chills, fever, night sweats or weight changes.  Cardiovascular:  No chest pain, dyspnea on exertion, edema, orthopnea, palpitations, paroxysmal nocturnal dyspnea. Dermatological: No rash, lesions/masses Respiratory: No cough, dyspnea Urologic: No hematuria, dysuria Abdominal:   No nausea, vomiting, diarrhea, bright red blood per rectum, melena, or hematemesis Neurologic:  No visual changes, wkns +changes in mental status. All other systems reviewed and are otherwise negative except as noted above.  Physical Exam  Blood pressure 156/77, pulse 57, temperature 98.3 F (36.8 C), temperature source Oral, resp. rate 20, height 5\' 1"  (1.549 m),  weight 166 lb 12.8 oz (75.66 kg), SpO2 100 %.  General: Pleasant, NAD Psych: Normal affect. Neuro: Alert and oriented X 3. Moves all extremities spontaneously. HEENT: Normal  Neck: Supple without bruits or JVD. Lungs:  Resp regular and unlabored, CTA. Heart: RRR no s3, s4, or murmurs. Abdomen: Soft, non-tender, non-distended, BS + x 4.  Extremities: No clubbing, cyanosis or edema. DP/PT/Radials 2+ and equal bilaterally.  Labs   Recent Labs  03/03/15 1947  TROPONINI <0.03   Lab Results  Component Value Date   WBC 6.6 03/03/2015   HGB 13.4 03/03/2015   HCT 40.5 03/03/2015   MCV 86.9 03/03/2015   PLT 250 03/03/2015     Recent Labs Lab 03/03/15 1819  NA 139  K 3.1*  CL 102  CO2 28  BUN 14  CREATININE  1.21*  CALCIUM 10.2  GLUCOSE 126*   Lab Results  Component Value Date   CHOL 103 03/04/2015   HDL 35* 03/04/2015   LDLCALC 58 03/04/2015   TRIG 48 03/04/2015   No results found for: DDIMER  Radiology/Studies  Dg Chest 2 View  03/03/2015  CLINICAL DATA:  Syncopal episode today. Shortness of breath. History of hypertension and myocardial infarction. EXAM: CHEST  2 VIEW COMPARISON:  03/08/2011 and 02/12/2011 radiographs. FINDINGS: The heart size and mediastinal contours are normal. The lungs are clear. There is no pleural effusion or pneumothorax. No acute osseous findings are identified. There are degenerative changes at both shoulders with possible mild narrowing of the subacromial space. IMPRESSION: No active cardiopulmonary process. Electronically Signed   By: Richardean Sale M.D.   On: 03/03/2015 19:13   Dg Forearm Left  02/18/2015  CLINICAL DATA:  [place] last Thursday and injured left forearm. Large bruise. EXAM: LEFT FOREARM - 2 VIEW COMPARISON:  None. FINDINGS: The wrist and elbow joints are maintained. No acute forearm fracture is identified. There is a prominent radial tuberosity. This could be a tuft like process from the biceps tendon or possibly a the  osteochondroma. It is likely incidental. IMPRESSION: No acute forearm fracture. Electronically Signed   By: Marijo Sanes M.D.   On: 02/18/2015 16:37   Ct Head Wo Contrast  03/03/2015  CLINICAL DATA:  At least two Syncopal episodes in the past 2 weeks. Nausea, dizziness, posterior neck pain. No current symptoms. Query shaking spell. EXAM: CT HEAD WITHOUT CONTRAST TECHNIQUE: Contiguous axial images were obtained from the base of the skull through the vertex without intravenous contrast. COMPARISON:  MR brain 03/10/2011.  CT head 03/08/2011 FINDINGS: No evidence for acute infarction, hemorrhage, mass lesion, hydrocephalus, or extra-axial fluid. Slight premature for age cerebral atrophy. Hypoattenuation of white matter representing chronic microvascular ischemic change. Evidence for remote deep white matter and deep nuclei lacunar infarcts. The calvarium is intact. Vascular calcifications noted. No layering sinus or mastoid fluid. Negative orbits. Progression of small vessel disease compared with priors. IMPRESSION: No acute intracranial findings.  Chronic changes as described. Electronically Signed   By: Staci Righter M.D.   On: 03/03/2015 20:52   Mr Brain Wo Contrast  03/04/2015  CLINICAL DATA:  Syncope, mild LEFT-sided weakness beginning at 4:30 p.m. hours, with posterior neck pain. Syncopal episode while cooking. Nausea with pressure like headache, symptoms now resolved. History of hypertension, hyperlipidemia, stroke. EXAM: MRI HEAD WITHOUT CONTRAST MRA HEAD WITHOUT CONTRAST TECHNIQUE: Multiplanar, multiecho pulse sequences of the brain and surrounding structures were obtained without intravenous contrast. Angiographic images of the head were obtained using MRA technique without contrast. COMPARISON:  CT head March 03, 2015 and MRI brain March 10, 2011 FINDINGS: MRI HEAD FINDINGS No reduced diffusion to suggest acute ischemia. Faint susceptibility RIGHT basal ganglia corresponding to old RIGHT basal  ganglia to corona radiata lacunar infarct. Old LEFT thalamus lacunar infarct. Old small LEFT pontine infarct. Patchy supratentorial white matter T2 hyperintensities compatible chronic small vessel ischemic disease, unchanged. No midline shift, mass effect, mass lesions. No abnormal extra-axial fluid collections. No extra-axial masses though, contrast enhanced sequences would be more sensitive. Normal major intracranial vascular flow voids seen at the skull base. Ocular globes and orbital contents are unremarkable though not tailored for evaluation. No abnormal sellar expansion. Visualized paranasal sinuses and mastoid air cells are well-aerated. No suspicious calvarial bone marrow signal. Craniocervical junction maintained. Patient is edentulous. MRA HEAD FINDINGS Anterior circulation: Flow related enhancement  of the included cervical, petrous, cavernous and supra clinoid internal carotid arteries. Mild luminal irregularity of the para clinoid internal carotid arteries corresponding to calcific atherosclerosis on today's.Patent anterior communicating artery. Normal flow related enhancement of the anterior and middle cerebral arteries, including more distal segments. No large vessel occlusion, high-grade stenosis, abnormal luminal irregularity, aneurysm. Posterior circulation: Codominant vertebral arteries. Basilar artery is patent, with normal flow related enhancement of the main branch vessels. Normal flow related enhancement of the posterior cerebral arteries. No large vessel occlusion, high-grade stenosis, abnormal luminal irregularity, aneurysm. IMPRESSION: MRI HEAD: No acute intracranial process, specifically no acute ischemia. Chronic changes including old RIGHT basal basal ganglia and LEFT thalamus infarcts. Moderate chronic small vessel ischemic disease. MRA HEAD: No acute large vessel occlusion or high-grade stenosis. Mild stenosis of the paraclinoid internal carotid arteries associated with calcific  atherosclerosis. Electronically Signed   By: Elon Alas M.D.   On: 03/04/2015 00:45   US Renal  03/04/2015  CLINICAL DATA:  Acute kidney injury. EXAM: RENAL / URINARY TRACT ULTRASOUND COMPLETE COMPARISON:  None. FINDINGS: Right Kidney: Length: 9.3 cm. Echogenicity within normal limits. No mass or hydronephrosis visualized. Left Kidney: Length: 9.4 cm. Echogenicity within normal limits. No mass or hydronephrosis visualized. Bladder: Normal distention. No filling defect or wall thickening appreciated. IMPRESSION: Normal ultrasound appearance of kidneys and bladder. No hydronephrosis. Electronically Signed   By: Lucienne Capers M.D.   On: 03/04/2015 00:36   Mr Jodene Nam Head/brain Wo Cm  03/04/2015  CLINICAL DATA:  Syncope, mild LEFT-sided weakness beginning at 4:30 p.m. hours, with posterior neck pain. Syncopal episode while cooking. Nausea with pressure like headache, symptoms now resolved. History of hypertension, hyperlipidemia, stroke. EXAM: MRI HEAD WITHOUT CONTRAST MRA HEAD WITHOUT CONTRAST TECHNIQUE: Multiplanar, multiecho pulse sequences of the brain and surrounding structures were obtained without intravenous contrast. Angiographic images of the head were obtained using MRA technique without contrast. COMPARISON:  CT head March 03, 2015 and MRI brain March 10, 2011 FINDINGS: MRI HEAD FINDINGS No reduced diffusion to suggest acute ischemia. Faint susceptibility RIGHT basal ganglia corresponding to old RIGHT basal ganglia to corona radiata lacunar infarct. Old LEFT thalamus lacunar infarct. Old small LEFT pontine infarct. Patchy supratentorial white matter T2 hyperintensities compatible chronic small vessel ischemic disease, unchanged. No midline shift, mass effect, mass lesions. No abnormal extra-axial fluid collections. No extra-axial masses though, contrast enhanced sequences would be more sensitive. Normal major intracranial vascular flow voids seen at the skull base. Ocular globes and  orbital contents are unremarkable though not tailored for evaluation. No abnormal sellar expansion. Visualized paranasal sinuses and mastoid air cells are well-aerated. No suspicious calvarial bone marrow signal. Craniocervical junction maintained. Patient is edentulous. MRA HEAD FINDINGS Anterior circulation: Flow related enhancement of the included cervical, petrous, cavernous and supra clinoid internal carotid arteries. Mild luminal irregularity of the para clinoid internal carotid arteries corresponding to calcific atherosclerosis on today's.Patent anterior communicating artery. Normal flow related enhancement of the anterior and middle cerebral arteries, including more distal segments. No large vessel occlusion, high-grade stenosis, abnormal luminal irregularity, aneurysm. Posterior circulation: Codominant vertebral arteries. Basilar artery is patent, with normal flow related enhancement of the main branch vessels. Normal flow related enhancement of the posterior cerebral arteries. No large vessel occlusion, high-grade stenosis, abnormal luminal irregularity, aneurysm. IMPRESSION: MRI HEAD: No acute intracranial process, specifically no acute ischemia. Chronic changes including old RIGHT basal basal ganglia and LEFT thalamus infarcts. Moderate chronic small vessel ischemic disease. MRA HEAD: No acute large vessel occlusion or  high-grade stenosis. Mild stenosis of the paraclinoid internal carotid arteries associated with calcific atherosclerosis. Electronically Signed   By: Elon Alas M.D.   On: 03/04/2015 00:45    ECG  NSR without significant ventricular ectopy  ASSESSMENT AND PLAN  1. Syncope: her description seems to be neurogenic given onset of pain and headache prior to the event  - she did not have positional changes, thus making orthostatic hypotension unlikely.   - symptom likely precipitated by combination of neurogenic cause with dehydration and possibly marijuana as well. No  significant arrhythmia seen on telemetry  - cut HCTZ in half, ok to restart losartan. Continue metoprolol.   - will discuss with MD, potentially do 30 day even monitor. Her EKG and recent negative myoview is reassuring  - if Echo normal, ok for discharge today.   2. AKI likely dehydration: Cr 1.2 on arrival, normal Cr 0.8-->0.9  3. CAD s/p PCI to mid RCA in 02/2011  - continue ASA and plavix  4. Hypertension  5. CVA felt to be microvascular in origin 02/2011   Hilbert Corrigan, PA-C 03/04/2015, 12:44 PM

## 2015-03-04 NOTE — Discharge Instructions (Signed)

## 2015-03-04 NOTE — Care Management Note (Signed)
Case Management Note  Patient Details  Name: Vanessa Proctor MRN: [id number] Date of Birth: 1955-06-06  Subjective/Objective:                    Action/Plan: Met with patient to discuss home health services. Patient initially chose [facility], but they were unable to provide services until the following week. Patient then chose [facility]. [name] with [name] was notified and has accepted the referral for discharge home today.   Expected Discharge Date:                  Expected Discharge Plan:  [place]  In-House Referral:     Discharge planning Services  CM Consult  Post Acute Care Choice:  Home Health Choice offered to:  Patient  DME Arranged:    DME Agency:     HH Arranged:  RN, PT, OT HH Agency:  [facility]  Status of Service:  Completed, signed off  Medicare Important Message Given:    Date Medicare IM Given:    Medicare IM give by:    Date Additional Medicare IM Given:    Additional Medicare Important Message give by:     If discussed at [place] of Stay Meetings, dates discussed:    Additional Comments:  Rolm Baptise, RN 03/04/2015, 5:04 PM

## 2015-03-04 NOTE — Progress Notes (Signed)
VASCULAR LAB PRELIMINARY  PRELIMINARY  PRELIMINARY  PRELIMINARY  Carotid duplex completed.    Preliminary report:  Bilateral:  1-39% ICA stenosis.  Vertebral artery flow is antegrade.     Jessee Mezera, RVS 03/04/2015, 9:43 AM

## 2015-03-04 NOTE — ED Notes (Signed)
MRI called to find out about pt status. Pt waiting to have U/S completed prior to coming back and going upstairs.

## 2015-03-05 DIAGNOSIS — I10 Essential (primary) hypertension: Secondary | ICD-10-CM | POA: Diagnosis not present

## 2015-03-05 DIAGNOSIS — I2510 Atherosclerotic heart disease of native coronary artery without angina pectoris: Secondary | ICD-10-CM | POA: Diagnosis not present

## 2015-03-05 LAB — HEMOGLOBIN A1C
Hgb A1c MFr Bld: 6.3 % — ABNORMAL HIGH (ref 4.8–5.6)
Mean Plasma Glucose: 134 mg/dL

## 2015-03-06 DIAGNOSIS — I2510 Atherosclerotic heart disease of native coronary artery without angina pectoris: Secondary | ICD-10-CM | POA: Diagnosis not present

## 2015-03-06 DIAGNOSIS — I10 Essential (primary) hypertension: Secondary | ICD-10-CM | POA: Diagnosis not present

## 2015-03-11 ENCOUNTER — Ambulatory Visit (INDEPENDENT_AMBULATORY_CARE_PROVIDER_SITE_OTHER): Payer: Medicare Other

## 2015-03-11 DIAGNOSIS — R55 Syncope and collapse: Secondary | ICD-10-CM | POA: Diagnosis not present

## 2015-03-12 ENCOUNTER — Telehealth: Payer: Self-pay | Admitting: Cardiology

## 2015-03-12 DIAGNOSIS — I10 Essential (primary) hypertension: Secondary | ICD-10-CM | POA: Diagnosis not present

## 2015-03-12 DIAGNOSIS — I2510 Atherosclerotic heart disease of native coronary artery without angina pectoris: Secondary | ICD-10-CM | POA: Diagnosis not present

## 2015-03-12 NOTE — Telephone Encounter (Signed)
Pt states that she was recently discharged from the hospital with a diagnosis of syncopal episodes with no known cause of origin.  Pt states that per her discharge instructions from the hospital, she was advised not to drive until they found out exactly what is causing her syncopal episodes.  Pt states she had a cardiac event monitor placed, to see if this is cardiac related or not.  Pt would like for Dr Aundra Dubin to advise if she should still hold off on driving, or can she resume back to driving.  Informed the pt that Dr Aundra Dubin is currently out of the office today, but I will forward this message to him and his covering nurse for further review and recommendation and follow-up with the pt thereafter.  Advised the pt that given there is no known cause for her syncopal episodes at this time, she should hold off on driving, until further advised by her MD.  Pt verbalized understanding and agrees with this plan.

## 2015-03-12 NOTE — Telephone Encounter (Signed)
New Message   Pt states that her discharge instructions state for her to not drive  She came in yesterday 11/28 for mer monitor and she wants to know if she is still not suppose to drive

## 2015-03-12 NOTE — Telephone Encounter (Signed)
She should hold off on driving until she completes event monitor and I see her back in office (have her see me after monitor is completed).

## 2015-03-13 DIAGNOSIS — I2510 Atherosclerotic heart disease of native coronary artery without angina pectoris: Secondary | ICD-10-CM | POA: Diagnosis not present

## 2015-03-13 DIAGNOSIS — I10 Essential (primary) hypertension: Secondary | ICD-10-CM | POA: Diagnosis not present

## 2015-03-13 NOTE — Telephone Encounter (Signed)
Informed patient.  She verbalizes understanding and agreement.  She is appreciative for the follow up.   Already has appointment scheduled with Ermalinda Barrios, APP on 04/01/15.

## 2015-03-15 DIAGNOSIS — I10 Essential (primary) hypertension: Secondary | ICD-10-CM | POA: Diagnosis not present

## 2015-03-15 DIAGNOSIS — I2510 Atherosclerotic heart disease of native coronary artery without angina pectoris: Secondary | ICD-10-CM | POA: Diagnosis not present

## 2015-03-19 DIAGNOSIS — I2510 Atherosclerotic heart disease of native coronary artery without angina pectoris: Secondary | ICD-10-CM | POA: Diagnosis not present

## 2015-03-19 DIAGNOSIS — I10 Essential (primary) hypertension: Secondary | ICD-10-CM | POA: Diagnosis not present

## 2015-03-21 ENCOUNTER — Ambulatory Visit: Payer: Medicare Other | Admitting: Internal Medicine

## 2015-03-21 DIAGNOSIS — I10 Essential (primary) hypertension: Secondary | ICD-10-CM | POA: Diagnosis not present

## 2015-03-21 DIAGNOSIS — I2510 Atherosclerotic heart disease of native coronary artery without angina pectoris: Secondary | ICD-10-CM | POA: Diagnosis not present

## 2015-03-25 DIAGNOSIS — I2510 Atherosclerotic heart disease of native coronary artery without angina pectoris: Secondary | ICD-10-CM | POA: Diagnosis not present

## 2015-03-25 DIAGNOSIS — I10 Essential (primary) hypertension: Secondary | ICD-10-CM | POA: Diagnosis not present

## 2015-03-26 DIAGNOSIS — I10 Essential (primary) hypertension: Secondary | ICD-10-CM | POA: Diagnosis not present

## 2015-03-26 DIAGNOSIS — I2510 Atherosclerotic heart disease of native coronary artery without angina pectoris: Secondary | ICD-10-CM | POA: Diagnosis not present

## 2015-03-28 DIAGNOSIS — I10 Essential (primary) hypertension: Secondary | ICD-10-CM | POA: Diagnosis not present

## 2015-03-28 DIAGNOSIS — I2510 Atherosclerotic heart disease of native coronary artery without angina pectoris: Secondary | ICD-10-CM | POA: Diagnosis not present

## 2015-03-30 ENCOUNTER — Other Ambulatory Visit: Payer: Self-pay | Admitting: Cardiology

## 2015-04-01 ENCOUNTER — Encounter: Payer: Self-pay | Admitting: Physician Assistant

## 2015-04-01 ENCOUNTER — Ambulatory Visit (INDEPENDENT_AMBULATORY_CARE_PROVIDER_SITE_OTHER): Payer: Medicare Other | Admitting: Physician Assistant

## 2015-04-01 VITALS — BP 130/80 | HR 80 | Ht 61.0 in | Wt 169.0 lb

## 2015-04-01 DIAGNOSIS — R079 Chest pain, unspecified: Secondary | ICD-10-CM | POA: Diagnosis not present

## 2015-04-01 DIAGNOSIS — I2510 Atherosclerotic heart disease of native coronary artery without angina pectoris: Secondary | ICD-10-CM

## 2015-04-01 DIAGNOSIS — I10 Essential (primary) hypertension: Secondary | ICD-10-CM | POA: Diagnosis not present

## 2015-04-01 DIAGNOSIS — N179 Acute kidney failure, unspecified: Secondary | ICD-10-CM

## 2015-04-01 DIAGNOSIS — F17200 Nicotine dependence, unspecified, uncomplicated: Secondary | ICD-10-CM

## 2015-04-01 DIAGNOSIS — R55 Syncope and collapse: Secondary | ICD-10-CM

## 2015-04-01 LAB — BASIC METABOLIC PANEL
BUN: 10 mg/dL (ref 7–25)
CO2: 27 mmol/L (ref 20–31)
Calcium: 9.7 mg/dL (ref 8.6–10.4)
Chloride: 98 mmol/L (ref 98–110)
Creat: 0.73 mg/dL (ref 0.50–0.99)
Glucose, Bld: 85 mg/dL (ref 65–99)
Potassium: 4.1 mmol/L (ref 3.5–5.3)
Sodium: 132 mmol/L — ABNORMAL LOW (ref 135–146)

## 2015-04-01 NOTE — Assessment & Plan Note (Signed)
Recheck kidney function today on decreased HCTZ.

## 2015-04-01 NOTE — Assessment & Plan Note (Signed)
Blood Pressure controlled 

## 2015-04-01 NOTE — Assessment & Plan Note (Signed)
Stable without Chest pain

## 2015-04-01 NOTE — Assessment & Plan Note (Addendum)
Patient is currently wearing a 30 day monitor. I reviewed the monitor up until today and she has had no bradycardia, tachycardia or arrhythmias. She's run out of her metoprolol 2 days ago and is having frequent PVCs. 2-D echo showed normal LV function and diastolic function. She has had no further syncope but has occasional dizziness. It was felt she was dehydrated and her HCTZ was decreased to half a tablet daily. Follow-up with Dr. Aundra Dubin in one month

## 2015-04-01 NOTE — Assessment & Plan Note (Signed)
In team used to smoke an occasional cigarette when she is stressed. Smoking cessation recommended.

## 2015-04-01 NOTE — Patient Instructions (Signed)
Medication Instructions:   CONTINUE SAME MEDICATIONS    If you need a refill on your cardiac medications before your next appointment, please call your pharmacy.  Labwork: NONE ORDER TODAY    Testing/Procedures: NONE ORDER TODAY '   Follow-Up:  WITH DR [facility]  IN ONE TO TWO MONTHS    Any Other Special Instructions Will Be Listed Below (If Applicable).

## 2015-04-01 NOTE — Progress Notes (Signed)
Cardiology Office Note   Date:  04/01/2015   ID:  Vanessa Proctor, DOB [birth year], MRN [id number]  PCP:  Angelica Chessman, MD  Cardiologist:  Dr. Aundra Dubin  Chief Complaint: Dizzy    History of Present Illness: Vanessa Proctor is a 59 y.o. female who presents for hospital follow-up. It appears she had an episode of neurocardiogenic syncope triggered by pain exacerbated by intravascular volume depletion. She was cooking and developed pain in the back of her neck and severe headache before she passed out. She was told to increase her fluids and a 30 day event monitor was placed. 2-D echo showed normal LV function EF 60-65%. Her mild diastolic function. Carotid Dopplers 1-39% ICA stenosis bilaterally. There was concern about TIA because of transient left-sided weakness but MRI did not show any stroke. Her HCTZ was decreased to half a tablet daily.  She has history of CAD status post NSTEMI treated with DES to the RCA in 2012. She wakes later she had a CVA, EF normal on echo. More chest pain 11/2014 and Cardiolite was low risk.'s also suffered from anxiety depression. She continues to smoke.  Since she's been home she has had a little dizziness, and feels clumsy. No further syncope. Becomes stressed when a lot of people are around. She ran out of her losartan and metoprolol 2 days ago. She's having a lot of anxiety.    Past Medical History  Diagnosis Date  . Hypertension   . Depression   . Myocardial infarction [facility])     november 1st 2012  . Coronary artery disease   . Stroke [facility])     november, 2012  . Arthritis     knees  . Nausea & vomiting last month  . Constipation last month  . Rectal bleeding   . Tobacco abuse   . Drug abuse     Past Surgical History  Procedure Laterality Date  . Abdominal hysterectomy  1985    partial  . Coronary angioplasty with stent placement  2012    x 1  . Tonsillectomy  1980  . Knot drained and removed from side of face  2007  . Finger  fracture surgery Left     reduction with  splint  . Colonoscopy N/A 04/28/2013    Procedure: COLONOSCOPY;  Surgeon: Beryle Beams, MD;  Location: [facility] ENDOSCOPY;  Service: Endoscopy;  Laterality: N/A;     Current Outpatient Prescriptions  Medication Sig Dispense Refill  . albuterol (PROVENTIL HFA;VENTOLIN HFA) 108 (90 BASE) MCG/ACT inhaler Inhale 2 puffs into the lungs every 6 (six) hours as needed for wheezing or shortness of breath. 1 Inhaler 2  . aspirin 81 MG chewable tablet Chew 81 mg by mouth daily after lunch.     Marland Kitchen atorvastatin (LIPITOR) 80 MG tablet Take 40 mg by mouth daily. After lunch    . citalopram (CELEXA) 20 MG tablet Take 20 mg by mouth at bedtime.    . clopidogrel (PLAVIX) 75 MG tablet Take 75 mg by mouth daily. At lunch    . hydrochlorothiazide (HYDRODIURIL) 25 MG tablet Take 0.5 tablets (12.5 mg total) by mouth daily. 30 tablet 3  . losartan (COZAAR) 100 MG tablet TAKE ONE TABLET BY MOUTH ONCE DAILY 30 tablet 11  . metoprolol tartrate (LOPRESSOR) 25 MG tablet TAKE ONE TABLET BY MOUTH TWICE DAILY 60 tablet 11  . nicotine (NICODERM CQ - DOSED IN MG/24 HOURS) 14 mg/24hr patch Place 1 patch onto the skin daily as needed (smoking cessation).     Marland Kitchen  polyethylene glycol (MIRALAX / GLYCOLAX) packet Take 17 g by mouth daily as needed for mild constipation.     No current facility-administered medications for this visit.    Allergies:   Bactrim and Sulfa antibiotics    Social History:  The patient  reports that she has been smoking Cigarettes.  She has a 10 pack-year smoking history. She has never used smokeless tobacco. She reports that she uses illicit drugs (Marijuana). She reports that she does not drink alcohol.   Family History:  The patient's    family history includes Cancer in her mother; Heart attack in her maternal grandfather; Hypertension in her maternal grandfather; Pancreatic cancer in her sister; Stroke in her brother.    ROS:  Please see the history of present  illness.   Otherwise, review of systems are positive for easy bruising, fatigue, dyspnea on exertion, nausea, anxiety, balance issues, headaches.   All other systems are reviewed and negative.    PHYSICAL EXAM: VS:  BP 130/80 mmHg  Pulse 80  Ht 5\' 1"  (1.549 m)  Wt 169 lb (76.658 kg)  BMI 31.95 kg/m2 , BMI Body mass index is 31.95 kg/(m^2). GEN: Well nourished, well developed, in no acute distress Neck: no JVD, HJR, carotid bruits, or masses Cardiac:  RRR; no murmurs,gallop, rubs, thrill or heave,  Respiratory:  clear to auscultation bilaterally, normal work of breathing GI: soft, nontender, nondistended, + BS MS: no deformity or atrophy Extremities: without cyanosis, clubbing, edema, good distal pulses bilaterally.  Skin: warm and dry, no rash Neuro:  Strength and sensation are intact    EKG:  EKG is ordered today. The ekg ordered today demonstrates normal sinus rhythm with frequent PVCs, LVH   Recent Labs: 11/28/2014: TSH 1.43 03/03/2015: BUN 14; Creatinine, Ser 1.21*; Hemoglobin 13.4; Magnesium 2.1; Platelets 250; Potassium 3.1*; Sodium 139    Lipid Panel    Component Value Date/Time   CHOL 103 03/04/2015 0513   TRIG 48 03/04/2015 0513   HDL 35* 03/04/2015 0513   CHOLHDL 2.9 03/04/2015 0513   VLDL 10 03/04/2015 0513   LDLCALC 58 03/04/2015 0513      Wt Readings from Last 3 Encounters:  04/01/15 169 lb (76.658 kg)  03/04/15 166 lb 12.8 oz (75.66 kg)  02/01/15 169 lb 12.8 oz (77.021 kg)      Other studies Reviewed: Additional studies/ records that were reviewed today include and review of the records demonstrates:   2-D echocardiogram 03/04/15 Study Conclusions - Left ventricle: The cavity size was normal. Wall thickness was normal. Systolic function was normal. The estimated ejection fraction was in the range of 60% to 65%. Wall motion was normal; there were no regional wall motion abnormalities. Left ventricular diastolic function parameters were normal. -  Aortic valve: There was no stenosis. - Mitral valve: There was trivial regurgitation. - Right ventricle: The cavity size was normal. Systolic function was normal. - Tricuspid valve: Peak RV-RA gradient (S): 20 mm Hg. - Pulmonary arteries: PA peak pressure: 23 mm Hg (S). - Inferior vena cava: The vessel was normal in size. The respirophasic diameter changes were in the normal range (>= 50%), consistent with normal central venous pressure. Impressions: - Normal LV size and systolic function, EF [id number]. Normal diastolic function. Normal RV size and systolic function. No significant valvular abnormalities.  Carotid Dopplers 03/04/15 Preliminary report:  Bilateral:  1-39% ICA stenosis.  Vertebral artery flow is antegrade.       ASSESSMENT AND PLAN: Syncope Patient is currently wearing a  30 day monitor. I reviewed the monitor up until today and she has had no bradycardia, tachycardia or arrhythmias. She's run out of her metoprolol 2 days ago and is having frequent PVCs. 2-D echo showed normal LV function and diastolic function. She has had no further syncope but has occasional dizziness. It was felt she was dehydrated and her HCTZ was decreased to half a tablet daily. Follow-up with Dr. Aundra Dubin in one month  HYPERTENSION, BENIGN ESSENTIAL Blood Pressure controlled  CAD (coronary artery disease) Stable without Chest pain  TOBACCO ABUSE In team used to smoke an occasional cigarette when she is stressed. Smoking cessation recommended.  AKI (acute kidney injury) ([place]) Recheck kidney function today on decreased HCTZ.     Sumner Boast, PA-C  04/01/2015 12:12 PM    [facility] [place], [place],  [place]  [id number] Phone: [phone]; Fax: [phone]

## 2015-04-03 ENCOUNTER — Telehealth: Payer: Self-pay | Admitting: Cardiology

## 2015-04-03 NOTE — Telephone Encounter (Signed)
I reviewed the pt's chart and she has been taking Plavix since 02/2011. The pt said she brushes her tongue everyday and today she triggered her gag reflex and this made her cough.  She coughed up mucus and noticed bright red blood. This is the first time that this has happened.  The pt denies cough or respiratory infection at this time.  I instructed the pt to continue with observation at this time and if she notices further bleeding issues then she can contact our office. Pt agreed with plan.

## 2015-04-03 NOTE — Telephone Encounter (Signed)
New Message  Pt stated she has coughed up blood today- 04/03/15. Please call back and discuss.

## 2015-04-10 NOTE — Telephone Encounter (Signed)
Called patient, advised that the Dr hasn't reviewed monitor info and cleared her to drive.  Advised that I would send her question to the Dr.

## 2015-04-10 NOTE — Telephone Encounter (Signed)
I think that she should be able to drive if her completed 30 day monitor shows no events.  I do not think that I have the complete results yet.

## 2015-04-10 NOTE — Telephone Encounter (Signed)
Left message to call back  

## 2015-04-10 NOTE — Telephone Encounter (Signed)
°  New Prob   Pt  was seen in the hospital and was told she could not drive. She is calling to verify she is safe to drive now and that her restrictions are lifted.  Please call.

## 2015-04-11 NOTE — Telephone Encounter (Signed)
Spoke with pt and informed her of Dr. Claris Gladden response. Pt verbalized understanding and was in agreement with this plan. Advised pt that we will call with the results of monitor once Dr. Aundra Dubin is able to review those.

## 2015-04-12 ENCOUNTER — Telehealth: Payer: Self-pay | Admitting: *Deleted

## 2015-04-12 NOTE — Telephone Encounter (Signed)
-----   Message from Imogene Burn, PA-C sent at 04/12/2015 11:02 AM EST ----- Labs stable

## 2015-04-12 NOTE — Telephone Encounter (Signed)
Per Estella Husk, PA-C, called pt and advised her that her labs was stable.  Pt verbalized understanding.

## 2015-04-26 ENCOUNTER — Telehealth: Payer: Self-pay | Admitting: Cardiology

## 2015-04-26 NOTE — Telephone Encounter (Signed)
Dr Aundra Dubin signed handicap placard application, pt picked up today.

## 2015-04-26 NOTE — Telephone Encounter (Signed)
I will forward to Dr McLean for review 

## 2015-04-26 NOTE — Telephone Encounter (Signed)
Pt advised.

## 2015-04-26 NOTE — Telephone Encounter (Signed)
New message      Calling to see if Dr Aundra Dubin would sign for her to get a handicapp parking pass

## 2015-04-26 NOTE — Telephone Encounter (Signed)
That is fine 

## 2015-04-29 NOTE — Progress Notes (Signed)
SLP Note - late entry   03/04/15 1100  SLP G-Codes **NOT FOR INPATIENT CLASS**  Functional Assessment Tool Used skilled clinical judgment  Functional Limitations Memory  Swallow Current Status [id number]) CJ  Swallow Goal Status [id number]) CJ  Swallow Discharge Status [id number]) CJ  SLP Evaluations  $ SLP Speech Visit 1 Procedure  SLP Evaluations  $ SLP EVAL LANGUAGE/SOUND PRODUCTION 1 Procedure    Germain Osgood, M.A. CCC-SLP [phone]

## 2015-04-29 NOTE — Progress Notes (Signed)
SLP Note - late entry   03/04/15 1000  SLP G-Codes **NOT FOR INPATIENT CLASS**  Functional Assessment Tool Used skilled clinical judgment  Functional Limitations Swallowing  Swallow Current Status [id number]) CI  Swallow Goal Status [id number]) CI  Swallow Discharge Status [id number]) CI  SLP Evaluations  $ SLP Speech Visit 1 Procedure  SLP Evaluations  $BSS Swallow 1 Procedure    Germain Osgood, M.A. CCC-SLP [phone]

## 2015-04-29 NOTE — Progress Notes (Signed)
Physical Therapy Note  (Late entry for G Code correction)    2015-03-29 1330  PT G-Codes **NOT FOR INPATIENT CLASS**  Functional Assessment Tool Used Clinical Judgement  Functional Limitation Mobility: Walking and moving around  Mobility: Walking and Moving Around Current Status [id number]) CI  Mobility: Walking and Moving Around Goal Status [phone]) [place], [place] Pager [phone] Office [phone]

## 2015-05-24 ENCOUNTER — Ambulatory Visit (INDEPENDENT_AMBULATORY_CARE_PROVIDER_SITE_OTHER): Payer: Medicare Other | Admitting: Cardiology

## 2015-05-24 ENCOUNTER — Encounter: Payer: Self-pay | Admitting: Cardiology

## 2015-05-24 ENCOUNTER — Telehealth: Payer: Self-pay | Admitting: *Deleted

## 2015-05-24 VITALS — BP 158/90 | HR 80 | Ht 61.0 in | Wt 162.0 lb

## 2015-05-24 DIAGNOSIS — F17200 Nicotine dependence, unspecified, uncomplicated: Secondary | ICD-10-CM | POA: Diagnosis not present

## 2015-05-24 DIAGNOSIS — I2510 Atherosclerotic heart disease of native coronary artery without angina pectoris: Secondary | ICD-10-CM | POA: Diagnosis not present

## 2015-05-24 DIAGNOSIS — R55 Syncope and collapse: Secondary | ICD-10-CM | POA: Diagnosis not present

## 2015-05-24 DIAGNOSIS — I10 Essential (primary) hypertension: Secondary | ICD-10-CM | POA: Diagnosis not present

## 2015-05-24 MED ORDER — AMLODIPINE BESYLATE 5 MG PO TABS: 5.0000 mg | ORAL_TABLET | Freq: Every day | ORAL | Status: DC

## 2015-05-24 NOTE — Telephone Encounter (Signed)
-----   Message from Katrine Coho, RN sent at 05/24/2015  2:15 PM EST ----- Dr Aundra Dubin stopped and started amlodipine 5mg  daily today.  Dr Aundra Dubin would like someone to call her in about 2 weeks and get the readings.  Thanks

## 2015-05-24 NOTE — Patient Instructions (Addendum)
Medication Instructions:  Stop HCTZ (hydrocholorathiaze)   Start amlodipine 5mg  daily  Labwork: None   Testing/Procedures: None   Follow-Up: Your physician wants you to follow-up in: 6 months with Dr Aundra Dubin. (August 2017). You will receive a reminder letter in the mail two months in advance. If you don't receive a letter, please call our office to schedule the follow-up appointment.     Your physician has requested that you regularly monitor and record your blood pressure readings at home. Please use the same machine at the same time of day to check your readings and record them. We will call you in about 2 weeks to get the readings.     If you need a refill on your cardiac medications before your next appointment, please call your pharmacy.

## 2015-05-26 NOTE — Progress Notes (Signed)
Patient ID: Vanessa Proctor, female   DOB: [birth year], 60 y.o.   MRN: [id number] PCP: Dr. Doreene Burke  60 y.o. with history of CAD s/p NSTEMI and CVA presents for cardiology followup.  Vanessa Proctor had NSTEMI with PCI to RCA with DES in 11/12.  A few weeks after the NSTEMI, she had a CVA.  EF was preserved on echo.  She had more chest pain this summer and had a Cardiolite in 8/16 with no ischemia, low risk.    She had an episodes of syncope in 11/16.  She had been standing for a long time and had had little to drink that day.  She was hospitalized, and episode was thought to be vagal.  MRI showed no CVA, 30 day monitor was unremarkable, echo and carotid dopplers also were unremarkable.  HCTZ was increased due to concern that it promoted dehydration.   Since then, no further syncope. She has occasional sharp, atypical chest pain with emotion stress.  She is doing better back on Celexa with less anxiety and depression.  BP is running high.  She continues to smoke but wants to quit.  No significant exertional dyspnea.   Labs (11/12): K 3.9, creatinine 0.77 Labs (10/13): LDL 62, HDL 36, AST 47, ALT 43 Labs (11/13): AST 48, ALT 40 Labs (8/14): LDL 73, HDL 42 Labs (12/14): TSH normal, K 4.8, creatinine 0.78 Labs (4/15): K 4.3, creatinine 0.8 Labs (8/15): LDL 56, HDL 42 Labs (8/16): K 3.6, creatinine 0.82, TSH normal, LDL 55, HDL 35 Labs (11/16): LDL 58, HDL 35 Labs (12/16): K 4.1, creatinine 0.73  PMH: 1. HTN 2. Depression 3. CAD: NSTEMI in 11/12.  LHC with EF 55-60%, basal inferior HK, 99% mRCA stenosis, 40% OM1.  Patient had Promus DES to RCA.  Cardiolite (8/16) with fixed small anteroseptal defect likely artifcact, no ischemia, EF 69%, low risk.  4. CVA: 11/12, right external capsule.  Thought to be due to small vessel disease.  Carotid dopplers showed no significant stenosis.  Carotid dopplers 11/15 with minimal disease.  5. Elevated transaminases: Mild, with statin use.  6. ABIs (9/13): Normal.   7. Active smoker.  8. Colon polyps 9. Syncope: 30 day monitor (11/16) with rare PVCs. Echo (11/16) with EF 62, normal diastolic function, normal RV size and systolic function.  Carotid doppler (11/16) with minimal stenosis.   SH: Unemployed, has 2 daughters.  Enrolled at [facility] currently.  Active smoker but only rare cigarettes at this point.   FH: Father with CHF.  ROS: All systems reviewed and negative except as per HPI.   Current Outpatient Prescriptions  Medication Sig Dispense Refill  . albuterol (PROVENTIL HFA;VENTOLIN HFA) 108 (90 BASE) MCG/ACT inhaler Inhale 2 puffs into the lungs every 6 (six) hours as needed for wheezing or shortness of breath. 1 Inhaler 2  . aspirin 81 MG chewable tablet Chew 81 mg by mouth daily after lunch.     Marland Kitchen atorvastatin (LIPITOR) 80 MG tablet Take 40 mg by mouth daily. After lunch    . citalopram (CELEXA) 20 MG tablet Take 20 mg by mouth at bedtime.    . clopidogrel (PLAVIX) 75 MG tablet Take 75 mg by mouth daily. At lunch    . losartan (COZAAR) 100 MG tablet TAKE ONE TABLET BY MOUTH ONCE DAILY 30 tablet 11  . metoprolol tartrate (LOPRESSOR) 25 MG tablet TAKE ONE TABLET BY MOUTH TWICE DAILY 60 tablet 11  . nicotine (NICODERM CQ - DOSED IN MG/24 HOURS) 14 mg/24hr patch Place  1 patch onto the skin daily as needed (smoking cessation).     . polyethylene glycol (MIRALAX / GLYCOLAX) packet Take 17 g by mouth daily as needed for mild constipation.    Marland Kitchen amLODipine (NORVASC) 5 MG tablet Take 1 tablet (5 mg total) by mouth daily. 90 tablet 1   No current facility-administered medications for this visit.    BP 158/90 mmHg  Pulse 80  Ht 5\' 1"  (1.549 m)  Wt 162 lb (73.483 kg)  BMI 30.63 kg/m2 General: NAD Neck: No JVD, no thyromegaly or thyroid nodule.  Lungs: Clear to auscultation bilaterally with normal respiratory effort. CV: Nondisplaced PMI.  Heart regular S1/S2, no S3/S4, 1/6 SEM.  No edema.  Right carotid bruit. Normal pulses.  Feet are warm.    Abdomen: Soft, nontender, no hepatosplenomegaly, no distention.  Neurologic: Alert and oriented x 3.  Psych: Normal affect. Extremities: No clubbing or cyanosis.   Assessment/Plan 1. CAD: Atypical chest pain, suspect noncardiac. She will continue ASA 81, Plavix (with prior CVA), metoprolol, and statin.  Cardiolite in 8/16 with no ischemia.   2. Hyperlipidemia: Good lipids on atorvastatin 40.   3. HTN: BP elevated. I will add amlodipine 5 mg daily.   She will check BP daily and we will call for readings in 2 wks.  4. Smoking: I strongly encouraged her to quit smoking.  She is thinking about using nicotine patches. 5. H/o CVA: Patient continues Plavix.   6. Depression/anxiety: Improved on Celexa. 7. Syncope: Based on history and workup, I suspect episode was vasovagal.  There has been no recurrence.   Followup with me in 6 mos   Loralie Champagne 05/26/2015

## 2015-06-04 ENCOUNTER — Telehealth: Payer: Self-pay | Admitting: Cardiology

## 2015-06-04 NOTE — Telephone Encounter (Signed)
She can decrease metoprolol to 12.5 mg bid.

## 2015-06-04 NOTE — Telephone Encounter (Signed)
New message      Pt c/o BP issue: STAT if pt c/o blurred vision, one-sided weakness or slurred speech  1. What are your last 5 BP readings? 142/78 HR 48,  2. Are you having any other symptoms (ex. Dizziness, headache, blurred vision, passed out)?  headache 3. What is your BP issue? Pt is concerned that her pulse is low-----ok to call tomorrow

## 2015-06-04 NOTE — Telephone Encounter (Signed)
Patient called to report HR of 48 today. This is the first time it has been this low, it is usually 50s-low 70s. She was asymptomatic, but worried she is taking too many medications.  Her BP is always around 140/80. Instructed patient to check BP and HR prior to taking medications and to hold metoprolol if HR is 60 or less. She understands to records readings as well. Informed her Dr. Aundra Dubin will be consulted for further medication recommendations.

## 2015-06-05 NOTE — Telephone Encounter (Signed)
PT NOTIFIED  TO  DECREASE METOPROLOL TO  12.5 MG  BID  .Adonis Housekeeper

## 2015-06-07 ENCOUNTER — Telehealth: Payer: Self-pay | Admitting: Cardiology

## 2015-06-07 NOTE — Telephone Encounter (Signed)
Spoke with patient who states she has been monitoring BP and pulse as instructed by Dr. Aundra Dubin at last office visit on 2/10.  She called and was advised on 2/22 to decrease metoprolol to 12.5 mg bid due to heart rate of 48 bpm.  She states she is using a wrist cuff to monitor vital signs.  I asked her if she has access to an upper arm cuff as these are more accurate monitors and she states that she does.  I advised that since the majority of her pulse readings have been 70's-80's bpm that she should continue current medications.  I advised her to continue to monitor with upper arm cuff and to call back with readings next week.  I advised her to call back sooner if she gets consistent readings of pulse less than 60 bpm.  She verbalized understanding and agreement.

## 2015-06-07 NOTE — Telephone Encounter (Signed)
Called patient for BP readings.  Patient will call back in about 30 minutes

## 2015-06-07 NOTE — Telephone Encounter (Signed)
New message       Pt c/o BP issue: STAT if pt c/o blurred vision, one-sided weakness or slurred speech  1. What are your last 5 BP readings? 2-14 160/94 HR 74, 15th 164/90 HR 90, 16th 156/78 HR 83, 17th 176/91 HR 65, 18th 152/73 HR 89, 19th 140/77 HR 86, 20th 141/78 HR 73, 21st 149/69 HR 48 again 162/78 HR 63, 22nd 145/79 HR 63, 23rd 151/77 HR 85, today 165/84 HR 82  2. Are you having any other symptoms (ex. Dizziness, headache, blurred vision, passed out)? no  3. What is your BP issue? Calling to give bp readings

## 2015-06-09 ENCOUNTER — Encounter (HOSPITAL_COMMUNITY): Payer: Self-pay | Admitting: Emergency Medicine

## 2015-06-09 ENCOUNTER — Emergency Department (HOSPITAL_COMMUNITY)
Admission: EM | Admit: 2015-06-09 | Discharge: 2015-06-09 | Disposition: A | Discharge status: Home / Self Care | Payer: Medicare Other | Attending: Emergency Medicine | Admitting: Emergency Medicine

## 2015-06-09 DIAGNOSIS — Z7982 Long term (current) use of aspirin: Secondary | ICD-10-CM | POA: Insufficient documentation

## 2015-06-09 DIAGNOSIS — J40 Bronchitis, not specified as acute or chronic: Secondary | ICD-10-CM | POA: Diagnosis not present

## 2015-06-09 DIAGNOSIS — F329 Major depressive disorder, single episode, unspecified: Secondary | ICD-10-CM | POA: Diagnosis not present

## 2015-06-09 DIAGNOSIS — I2510 Atherosclerotic heart disease of native coronary artery without angina pectoris: Secondary | ICD-10-CM | POA: Diagnosis not present

## 2015-06-09 DIAGNOSIS — F17210 Nicotine dependence, cigarettes, uncomplicated: Secondary | ICD-10-CM | POA: Diagnosis not present

## 2015-06-09 DIAGNOSIS — Z79899 Other long term (current) drug therapy: Secondary | ICD-10-CM | POA: Insufficient documentation

## 2015-06-09 DIAGNOSIS — I252 Old myocardial infarction: Secondary | ICD-10-CM | POA: Diagnosis not present

## 2015-06-09 DIAGNOSIS — K5900 Constipation, unspecified: Secondary | ICD-10-CM | POA: Insufficient documentation

## 2015-06-09 DIAGNOSIS — R05 Cough: Secondary | ICD-10-CM | POA: Diagnosis not present

## 2015-06-09 DIAGNOSIS — Z8673 Personal history of transient ischemic attack (TIA), and cerebral infarction without residual deficits: Secondary | ICD-10-CM | POA: Diagnosis not present

## 2015-06-09 DIAGNOSIS — M170 Bilateral primary osteoarthritis of knee: Secondary | ICD-10-CM | POA: Diagnosis not present

## 2015-06-09 DIAGNOSIS — Z9861 Coronary angioplasty status: Secondary | ICD-10-CM | POA: Diagnosis not present

## 2015-06-09 DIAGNOSIS — I10 Essential (primary) hypertension: Secondary | ICD-10-CM | POA: Insufficient documentation

## 2015-06-09 DIAGNOSIS — Z7902 Long term (current) use of antithrombotics/antiplatelets: Secondary | ICD-10-CM | POA: Diagnosis not present

## 2015-06-09 DIAGNOSIS — J209 Acute bronchitis, unspecified: Secondary | ICD-10-CM | POA: Diagnosis not present

## 2015-06-09 MED ORDER — AZITHROMYCIN 250 MG PO TABS: 250.0000 mg | ORAL_TABLET | Freq: Every day | ORAL | Status: DC

## 2015-06-09 NOTE — ED Notes (Signed)
Pt stated "I've been having this x 2 wks.  I go to [facility] but I couldn't wait to go to the walk-in on Tuesday."

## 2015-06-09 NOTE — Discharge Instructions (Signed)
Upper Respiratory Infection, Adult Most upper respiratory infections (URIs) are a viral infection of the air passages leading to the lungs. A URI affects the nose, throat, and upper air passages. The most common type of URI is nasopharyngitis and is typically referred to as "the common cold." URIs run their course and usually go away on their own. Most of the time, a URI does not require medical attention, but sometimes a bacterial infection in the upper airways can follow a viral infection. This is called a secondary infection. Sinus and middle ear infections are common types of secondary upper respiratory infections. Bacterial pneumonia can also complicate a URI. A URI can worsen asthma and chronic obstructive pulmonary disease (COPD). Sometimes, these complications can require emergency medical care and may be life threatening.  CAUSES Almost all URIs are caused by viruses. A virus is a type of germ and can spread from one person to another.  RISKS FACTORS You may be at risk for a URI if:   You smoke.   You have chronic heart or lung disease.  You have a weakened defense (immune) system.   You are very young or very old.   You have nasal allergies or asthma.  You work in crowded or poorly ventilated areas.  You work in health care facilities or schools. SIGNS AND SYMPTOMS  Symptoms typically develop 2-3 days after you come in contact with a cold virus. Most viral URIs last 7-10 days. However, viral URIs from the influenza virus (flu virus) can last 14-18 days and are typically more severe. Symptoms may include:   Runny or stuffy (congested) nose.   Sneezing.   Cough.   Sore throat.   Headache.   Fatigue.   Fever.   Loss of appetite.   Pain in your forehead, behind your eyes, and over your cheekbones (sinus pain).  Muscle aches.  DIAGNOSIS  Your health care provider may diagnose a URI by:  Physical exam.  Tests to check that your symptoms are not due to  another condition such as:  Strep throat.  Sinusitis.  Pneumonia.  Asthma. TREATMENT  A URI goes away on its own with time. It cannot be cured with medicines, but medicines may be prescribed or recommended to relieve symptoms. Medicines may help:  Reduce your fever.  Reduce your cough.  Relieve nasal congestion. HOME CARE INSTRUCTIONS   Take medicines only as directed by your health care provider.   Gargle warm saltwater or take cough drops to comfort your throat as directed by your health care provider.  Use a warm mist humidifier or inhale steam from a shower to increase air moisture. This may make it easier to breathe.  Drink enough fluid to keep your urine clear or pale yellow.   Eat soups and other clear broths and maintain good nutrition.   Rest as needed.   Return to work when your temperature has returned to normal or as your health care provider advises. You may need to stay home longer to avoid infecting others. You can also use a face mask and careful hand washing to prevent spread of the virus.  Increase the usage of your inhaler if you have asthma.   Do not use any tobacco products, including cigarettes, chewing tobacco, or electronic cigarettes. If you need help quitting, ask your health care provider. PREVENTION  The best way to protect yourself from getting a cold is to practice good hygiene.   Avoid oral or hand contact with people with cold  symptoms.   Wash your hands often if contact occurs.  There is no clear evidence that vitamin C, vitamin E, echinacea, or exercise reduces the chance of developing a cold. However, it is always recommended to get plenty of rest, exercise, and practice good nutrition.  SEEK MEDICAL CARE IF:   You are getting worse rather than better.   Your symptoms are not controlled by medicine.   You have chills.  You have worsening shortness of breath.  You have brown or red mucus.  You have yellow or brown nasal  discharge.  You have pain in your face, especially when you bend forward.  You have a fever.  You have swollen neck glands.  You have pain while swallowing.  You have white areas in the back of your throat. SEEK IMMEDIATE MEDICAL CARE IF:   You have severe or persistent:  Headache.  Ear pain.  Sinus pain.  Chest pain.  You have chronic lung disease and any of the following:  Wheezing.  Prolonged cough.  Coughing up blood.  A change in your usual mucus.  You have a stiff neck.  You have changes in your:  Vision.  Hearing.  Thinking.  Mood. MAKE SURE YOU:   Understand these instructions.  Will watch your condition.  Will get help right away if you are not doing well or get worse.   This information is not intended to replace advice given to you by your health care provider. Make sure you discuss any questions you have with your health care provider.   Follow up with PCP for re-evaluation. Take anti-biotics as prescribed. Continue using home albuterol inhaler. Return to the ED if you experience severe worsening of your symptoms, fevers, chills, difficulty breathing, chest pain, dizziness.

## 2015-06-09 NOTE — ED Provider Notes (Signed)
CSN: [id number]     Arrival date & time 06/09/15  1549 History  By signing my name below, I, Dora Sims, attest that this documentation has been prepared under the direction and in the presence of non-physician practitioner, Donnald Garre, PA-C. Electronically Signed: Dora Sims, Scribe. 06/09/2015. 4:09 PM.    Chief Complaint  Patient presents with  . Cough  . Wheezing    The history is provided by the patient. No language interpreter was used.     HPI Comments: Vanessa Proctor is a 60 y.o. female with h/o MI, CAD, and acute bronchitis who presents to the Emergency Department complaining of sudden onset, worsening, cough for the last two weeks. Pt notes associated wheezing while laying down at night and SOB upon exertion as well. She uses an inhaler to alleviate her wheezing and SOB. She states that her cough is productive with green sputum; she reports that it is "getting drier." She notes that she coughed up blood one week ago. Pt was diagnosed with acute bronchitis last year and reports that she is experiencing the same symptoms now. She states that her cough has worsened over the last two weeks. She notes that she felt warm this morning but has not measured a fever since onset of symptoms. Pt also states that she has experienced urge incontinence due to her cough. She has been a smoker for 45 years. Her PCP is Dr. Algernon Huxley. She denies fever, numbness, weakness, or any other associated symptoms at this time.   Past Medical History  Diagnosis Date  . Hypertension   . Depression   . Myocardial infarction [facility])     november 1st 2012  . Coronary artery disease   . Stroke [facility])     november, 2012  . Arthritis     knees  . Nausea & vomiting last month  . Constipation last month  . Rectal bleeding   . Tobacco abuse   . Drug abuse    Past Surgical History  Procedure Laterality Date  . Abdominal hysterectomy  1985    partial  . Coronary angioplasty with stent placement   2012    x 1  . Tonsillectomy  1980  . Knot drained and removed from side of face  2007  . Finger fracture surgery Left     reduction with  splint  . Colonoscopy N/A 04/28/2013    Procedure: COLONOSCOPY;  Surgeon: Beryle Beams, MD;  Location: [facility] ENDOSCOPY;  Service: Endoscopy;  Laterality: N/A;   Family History  Problem Relation Age of Onset  . Coronary artery disease    . Cancer Mother     mass in abdomin  . Pancreatic cancer Sister   . Stroke Brother   . Heart attack Maternal Grandfather   . Hypertension Maternal Grandfather    Social History  Substance Use Topics  . Smoking status: Current Every Day Smoker -- 0.25 packs/day for 40 years    Types: Cigarettes  . Smokeless tobacco: Never Used     Comment: trying to quit today 02/05/14  . Alcohol Use: No     Comment: quit drugs 2011  marjuana   OB History    Gravida Para Term Preterm AB TAB SAB Ectopic Multiple Living   2 2 2       2      Review of Systems  All other systems reviewed and are negative.     Allergies  Bactrim and Sulfa antibiotics  Home Medications   Prior to Admission  medications   Medication Sig Start Date End Date Taking? Authorizing Provider  albuterol (PROVENTIL HFA;VENTOLIN HFA) 108 (90 BASE) MCG/ACT inhaler Inhale 2 puffs into the lungs every 6 (six) hours as needed for wheezing or shortness of breath. 08/13/14   Gregor Hams, MD  amLODipine (NORVASC) 5 MG tablet Take 1 tablet (5 mg total) by mouth daily. 05/24/15   Larey Dresser, MD  aspirin 81 MG chewable tablet Chew 81 mg by mouth daily after lunch.     Historical Provider, MD  atorvastatin (LIPITOR) 80 MG tablet Take 40 mg by mouth daily. After lunch    Historical Provider, MD  citalopram (CELEXA) 20 MG tablet Take 20 mg by mouth at bedtime.    Historical Provider, MD  clopidogrel (PLAVIX) 75 MG tablet Take 75 mg by mouth daily. At lunch    Historical Provider, MD  losartan (COZAAR) 100 MG tablet TAKE ONE TABLET BY MOUTH ONCE DAILY 04/01/15    Larey Dresser, MD  metoprolol tartrate (LOPRESSOR) 25 MG tablet TAKE ONE TABLET BY MOUTH TWICE DAILY 04/01/15   Larey Dresser, MD  nicotine (NICODERM CQ - DOSED IN MG/24 HOURS) 14 mg/24hr patch Place 1 patch onto the skin daily as needed (smoking cessation).  03/28/12   Larey Dresser, MD  polyethylene glycol [facility] Floria Raveling) packet Take 17 g by mouth daily as needed for mild constipation.    Historical Provider, MD   BP 116/78 mmHg  Pulse 87  Temp(Src) 98.8 F (37.1 C) (Oral)  Resp 22  SpO2 100% Physical Exam  Constitutional: She is oriented to person, place, and time. She appears well-developed and well-nourished. No distress.  HENT:  Head: Normocephalic and atraumatic.  Mouth/Throat: Oropharynx is clear and moist. No oropharyngeal exudate.  Eyes: Conjunctivae and EOM are normal. Pupils are equal, round, and reactive to light. Right eye exhibits no discharge. Left eye exhibits no discharge. No scleral icterus.  Neck: Neck supple.  Cardiovascular: Normal rate and intact distal pulses.  Exam reveals no gallop and no friction rub.   No murmur heard. Pulmonary/Chest: Effort normal. No respiratory distress. She has no wheezes. She has no rales. She exhibits no tenderness.  Lymphadenopathy:    She has no cervical adenopathy.  Neurological: She is alert and oriented to person, place, and time. Coordination normal.  Skin: Skin is warm and dry. No rash noted. She is not diaphoretic. No erythema. No pallor.  Psychiatric: She has a normal mood and affect. Her behavior is normal.  Nursing note and vitals reviewed.   ED Course  Procedures (including critical care time)  DIAGNOSTIC STUDIES: Oxygen Saturation is 100% on RA, normal by my interpretation.    COORDINATION OF CARE: 4:36 PM Will discharge with Zithromax 250 mg. Discussed treatment plan with pt at bedside and pt agreed to plan.   Labs Review Labs Reviewed - No data to display  Imaging Review  I have personally  reviewed and evaluated these images and lab results as part of my medical decision-making.   EKG Interpretation None      MDM   Final diagnoses:  Bronchitis    60 y.o F presents with productive cough x 2 weeks. Pt brought in a bag of her sputum which is green. Afebrile in ED. No wheezing or rales on CXR.Marland Kitchen However given pmhx and duration of symptoms will treat with azithromycin to cover atypicals. Pt is a smoker. Pt has home albuterol inhaler. No indication for steroids at this time. Follow up  with PCP. Return precautions outlined in patient discharge instructions. Pt is hemodynamically stable and ready for discharge.   I personally performed the services described in this documentation, which was scribed in my presence. The recorded information has been reviewed and is accurate.       Dondra Spry [place], PA-C 06/10/15 2040  Merrily Pew, MD 06/12/15 1105

## 2015-06-09 NOTE — ED Notes (Addendum)
Patient presents for cough x2 weeks, SOB with exertion and wheezing when lying down. Relief of SOB and wheezing with home inhaler. No wheezing with auscultation in triage.

## 2015-06-10 ENCOUNTER — Encounter (HOSPITAL_COMMUNITY): Payer: Self-pay | Admitting: Emergency Medicine

## 2015-06-10 ENCOUNTER — Emergency Department (HOSPITAL_COMMUNITY): Payer: Medicare Other

## 2015-06-10 ENCOUNTER — Telehealth: Payer: Self-pay | Admitting: Cardiology

## 2015-06-10 ENCOUNTER — Emergency Department (HOSPITAL_COMMUNITY)
Admission: EM | Admit: 2015-06-10 | Discharge: 2015-06-10 | Disposition: A | Discharge status: Home / Self Care | Payer: Medicare Other | Attending: Emergency Medicine | Admitting: Emergency Medicine

## 2015-06-10 DIAGNOSIS — F17210 Nicotine dependence, cigarettes, uncomplicated: Secondary | ICD-10-CM | POA: Diagnosis not present

## 2015-06-10 DIAGNOSIS — S99911A Unspecified injury of right ankle, initial encounter: Secondary | ICD-10-CM | POA: Diagnosis not present

## 2015-06-10 DIAGNOSIS — S96912A Strain of unspecified muscle and tendon at ankle and foot level, left foot, initial encounter: Secondary | ICD-10-CM | POA: Diagnosis not present

## 2015-06-10 DIAGNOSIS — W19XXXA Unspecified fall, initial encounter: Secondary | ICD-10-CM

## 2015-06-10 DIAGNOSIS — Y998 Other external cause status: Secondary | ICD-10-CM | POA: Diagnosis not present

## 2015-06-10 DIAGNOSIS — Z7902 Long term (current) use of antithrombotics/antiplatelets: Secondary | ICD-10-CM | POA: Diagnosis not present

## 2015-06-10 DIAGNOSIS — Y9389 Activity, other specified: Secondary | ICD-10-CM | POA: Diagnosis not present

## 2015-06-10 DIAGNOSIS — I10 Essential (primary) hypertension: Secondary | ICD-10-CM | POA: Diagnosis not present

## 2015-06-10 DIAGNOSIS — F329 Major depressive disorder, single episode, unspecified: Secondary | ICD-10-CM | POA: Diagnosis not present

## 2015-06-10 DIAGNOSIS — Z8719 Personal history of other diseases of the digestive system: Secondary | ICD-10-CM | POA: Diagnosis not present

## 2015-06-10 DIAGNOSIS — Z7982 Long term (current) use of aspirin: Secondary | ICD-10-CM | POA: Insufficient documentation

## 2015-06-10 DIAGNOSIS — Z8673 Personal history of transient ischemic attack (TIA), and cerebral infarction without residual deficits: Secondary | ICD-10-CM | POA: Insufficient documentation

## 2015-06-10 DIAGNOSIS — X501XXA Overexertion from prolonged static or awkward postures, initial encounter: Secondary | ICD-10-CM | POA: Insufficient documentation

## 2015-06-10 DIAGNOSIS — Z9861 Coronary angioplasty status: Secondary | ICD-10-CM | POA: Diagnosis not present

## 2015-06-10 DIAGNOSIS — S96911A Strain of unspecified muscle and tendon at ankle and foot level, right foot, initial encounter: Secondary | ICD-10-CM | POA: Diagnosis not present

## 2015-06-10 DIAGNOSIS — I2510 Atherosclerotic heart disease of native coronary artery without angina pectoris: Secondary | ICD-10-CM | POA: Insufficient documentation

## 2015-06-10 DIAGNOSIS — M170 Bilateral primary osteoarthritis of knee: Secondary | ICD-10-CM | POA: Diagnosis not present

## 2015-06-10 DIAGNOSIS — F909 Attention-deficit hyperactivity disorder, unspecified type: Secondary | ICD-10-CM | POA: Insufficient documentation

## 2015-06-10 DIAGNOSIS — Y9289 Other specified places as the place of occurrence of the external cause: Secondary | ICD-10-CM | POA: Diagnosis not present

## 2015-06-10 DIAGNOSIS — G4700 Insomnia, unspecified: Secondary | ICD-10-CM | POA: Diagnosis not present

## 2015-06-10 DIAGNOSIS — I252 Old myocardial infarction: Secondary | ICD-10-CM | POA: Insufficient documentation

## 2015-06-10 DIAGNOSIS — Z79899 Other long term (current) drug therapy: Secondary | ICD-10-CM | POA: Diagnosis not present

## 2015-06-10 DIAGNOSIS — S0990XA Unspecified injury of head, initial encounter: Secondary | ICD-10-CM | POA: Diagnosis not present

## 2015-06-10 LAB — RAPID URINE DRUG SCREEN, HOSP PERFORMED
Amphetamines: NOT DETECTED
Barbiturates: NOT DETECTED
Benzodiazepines: NOT DETECTED
Cocaine: NOT DETECTED
Opiates: NOT DETECTED
Tetrahydrocannabinol: POSITIVE — AB

## 2015-06-10 LAB — COMPREHENSIVE METABOLIC PANEL
ALT: 37 U/L (ref 14–54)
AST: 58 U/L — ABNORMAL HIGH (ref 15–41)
Albumin: 3.6 g/dL (ref 3.5–5.0)
Alkaline Phosphatase: 54 U/L (ref 38–126)
Anion gap: 10 (ref 5–15)
BUN: 10 mg/dL (ref 6–20)
CO2: 25 mmol/L (ref 22–32)
Calcium: 9.5 mg/dL (ref 8.9–10.3)
Chloride: 102 mmol/L (ref 101–111)
Creatinine, Ser: 0.74 mg/dL (ref 0.44–1.00)
GFR calc Af Amer: >60 mL/min (ref >60)
GFR calc non Af Amer: >60 mL/min (ref >60)
Glucose, Bld: 135 mg/dL — ABNORMAL HIGH (ref 65–99)
Potassium: 3 mmol/L — ABNORMAL LOW (ref 3.5–5.1)
Sodium: 137 mmol/L (ref 135–145)
Total Bilirubin: 0.8 mg/dL (ref 0.3–1.2)
Total Protein: 7.2 g/dL (ref 6.5–8.1)

## 2015-06-10 LAB — CBC WITH DIFFERENTIAL/PLATELET
Basophils Absolute: 0 10*3/uL (ref 0.0–0.1)
Basophils Relative: 0 %
Eosinophils Absolute: 0.2 10*3/uL (ref 0.0–0.7)
Eosinophils Relative: 4 %
HCT: 36.8 % (ref 36.0–46.0)
Hemoglobin: 12.2 g/dL (ref 12.0–15.0)
Lymphocytes Relative: 23 %
Lymphs Abs: 1 10*3/uL (ref 0.7–4.0)
MCH: 28.5 pg (ref 26.0–34.0)
MCHC: 33.2 g/dL (ref 30.0–36.0)
MCV: 86 fL (ref 78.0–100.0)
Monocytes Absolute: 0.5 10*3/uL (ref 0.1–1.0)
Monocytes Relative: 12 %
Neutro Abs: 2.7 10*3/uL (ref 1.7–7.7)
Neutrophils Relative %: 61 %
Platelets: 222 10*3/uL (ref 150–400)
RBC: 4.28 MIL/uL (ref 3.87–5.11)
RDW: 13.5 % (ref 11.5–15.5)
WBC: 4.4 10*3/uL (ref 4.0–10.5)

## 2015-06-10 LAB — URINALYSIS, ROUTINE W REFLEX MICROSCOPIC
Bilirubin Urine: NEGATIVE
Glucose, UA: NEGATIVE mg/dL
Hgb urine dipstick: NEGATIVE
Ketones, ur: NEGATIVE mg/dL
Leukocytes, UA: NEGATIVE
Nitrite: NEGATIVE
Protein, ur: NEGATIVE mg/dL
Specific Gravity, Urine: 1.016 (ref 1.005–1.030)
pH: 6 (ref 5.0–8.0)

## 2015-06-10 LAB — ETHANOL: Alcohol, Ethyl (B): <5 mg/dL (ref <5)

## 2015-06-10 MED ORDER — IBUPROFEN 400 MG PO TABS: 400.0000 mg | ORAL_TABLET | Freq: Four times a day (QID) | ORAL | Status: DC | PRN

## 2015-06-10 MED ORDER — POTASSIUM CHLORIDE CRYS ER 20 MEQ PO TBCR
40.0000 meq | EXTENDED_RELEASE_TABLET | Freq: Once | ORAL | Status: AC
  Administered 2015-06-10: 40 meq via ORAL
  Filled 2015-06-10: qty 2

## 2015-06-10 MED ORDER — TRAMADOL HCL 50 MG PO TABS: 50.0000 mg | ORAL_TABLET | Freq: Two times a day (BID) | ORAL | Status: DC | PRN

## 2015-06-10 MED ORDER — HYDROCODONE-ACETAMINOPHEN 5-325 MG PO TABS: 1.0000 | ORAL_TABLET | Freq: Four times a day (QID) | ORAL | Status: DC | PRN

## 2015-06-10 MED ORDER — TRAMADOL HCL 50 MG PO TABS
50.0000 mg | ORAL_TABLET | Freq: Once | ORAL | Status: AC
  Administered 2015-06-10: 50 mg via ORAL
  Filled 2015-06-10: qty 1

## 2015-06-10 NOTE — Discharge Instructions (Signed)
°Emergency Department Resource Guide °1) Find a Doctor and Pay Out of Pocket °Although you won't have to find out who is covered by your insurance plan, it is a good idea to ask around and get recommendations. You will then need to call the office and see if the doctor you have chosen will accept you as a new patient and what types of options they offer for patients who are self-pay. Some doctors offer discounts or will set up payment plans for their patients who do not have insurance, but you will need to ask so you aren't surprised when you get to your appointment. ° °2) Contact Your Local Health Department °Not all health departments have doctors that can see patients for sick visits, but many do, so it is worth a call to see if yours does. If you don't know where your local health department is, you can check in your phone book. The CDC also has a tool to help you locate your state's health department, and many state websites also have listings of all of their local health departments. ° °3) Find a Walk-in Clinic °If your illness is not likely to be very severe or complicated, you may want to try a walk in clinic. These are popping up all over the country in pharmacies, drugstores, and shopping centers. They're usually staffed by nurse practitioners or physician assistants that have been trained to treat common illnesses and complaints. They're usually fairly quick and inexpensive. However, if you have serious medical issues or chronic medical problems, these are probably not your best option. ° °No Primary Care Doctor: °- Call [facility] at  832-8000 - they can help you locate a primary care doctor that  accepts your insurance, provides certain services, etc. °- Physician Referral Service- 1-800-533-3463 ° °Chronic Pain Problems: °Organization         Address  Phone   Notes  °[facility]  (336) 297-2271 Patients need to be referred by their primary care doctor.  ° °Medication  Assistance: °Organization         Address  Phone   Notes  °[place] [facility] [address] °[place], [place] [address] (336) 641-8030 --Must be a resident of [place] °-- Must have NO insurance coverage whatsoever (no Medicaid/ Medicare, etc.) °-- The pt. MUST have a primary care doctor that directs their care regularly and follows them in the community °  °[facility]  (866) 331-1348   °[employer]  (888) 892-1162   ° °Agencies that provide inexpensive medical care: °Organization         Address  Phone   Notes  °[facility]  (336) 832-8035   °[facility]    (336) 832-7272   °[facility] [address] °[place], [place] [address] (336) 832-4777   °[facility] [address], °[place] (336) 271-4999   °[facility]    (336) 373-0678   °[facility]    (336) 272-1050   °[facility] ° [address], [place] Phone:  (336) 832-4444, Fax:  (336) 832-4440 Hours of Operation:  9 am - 6 pm, M-F.  Also accepts Medicaid/Medicare and self-pay.  °[facility] ° [address], [place] Phone: (336) [phone], Fax: (336) [phone]. Hours of Operation:  8:30 am - 5:30 pm, M-F.  Also accepts Medicaid and self-pay.  °[facility] [place] [address], [place] Phone: (336) 878-6027   °[facility] [address], [place], [place] (336)723-1848, Ext. 123 Mondays & Thursdays: 7-9 AM.  First 15 patients are seen on a first come, first serve basis. °  ° °Medicaid-accepting [place] Providers: ° °Organization         Address  Phone   Notes  °[facility] [address], [place] (336) [phone] Also accepts self-pay patients.  °[facility] [address], [place] ° (336) 856-9996   °[facility] [address], [place]  (336) 288-8857   °[facility] [address] [place] [address], [place] (336) 299-7000   °Veita Bland [address], [place]  ° (336) [phone] Only accepts [place] Access Medicaid patients after they have their name applied to their card.  ° °Self-Pay (no insurance) in [place]: ° °Organization         Address  Phone   Notes  °Sickle Cell Patients, [facility] [address], [place] (336) 832-1970   °[facility] [address], [place] (336) 832-4400   °[facility] [place] ° [address], [place] (336) 992-4800   °[facility]/Dr. Osei-Bonsu ° [address] [place] [address], [place] or [address], [place] (336) [phone] Phone number for both [place] and [place] locations is the same.  °[facility] [address], [place] (336) 299-0000   °[facility] [address] [place] [address], [place] or [address] (336) 852-7530 °(336) 878-2260   °[facility] [address], [place] (336) 350-1642, phone; (336) 294-5005, fax Sees patients 1st and 3rd Saturday of every month.  Must not qualify for public or private insurance (i.e. Medicaid, Medicare, [place] Health Choice, Veterans' Benefits) • Household income should be no more than 200% of the poverty level •The clinic cannot treat you if you are pregnant or think you are pregnant • Sexually transmitted diseases are not treated at the clinic.  ° ° °Dental Care: °Organization         Address  Phone  Notes  °[place] [facility] [address], [place] (336) 641-6152 Accepts children up to age 21 who are enrolled in Medicaid or [place] Health Choice; pregnant women with a Medicaid card; and children who have applied for Medicaid or [place] Health Choice, but were declined, whose parents can pay a reduced fee at time of service.  °[place]  [facility] [place]  [address], [place] (336) 641-7733 Accepts children up to age 21 who are enrolled in Medicaid or [place] Health Choice; pregnant women with a Medicaid card; and children who have applied for Medicaid or [place] Health Choice, but were declined, whose parents can pay a reduced fee at time of service.  °[facility] ° [address],  (336) 641-4533 Patients are seen by appointment only. Walk-ins are not accepted. [facility] will see patients 18 years of age and older. °Monday - Tuesday (8am-5pm) °Most Wednesdays (8:30-5pm) °$30 per visit, cash only  °[facility] ° [address], [place] (336) 641-4533 Patients are seen by appointment only. Walk-ins are not accepted. [facility] will see patients 18 years of age and older. °One   Wednesday Evening (Monthly: Volunteer Based).  $30 per visit, cash only  °[facility]  (919) [phone] for adults; Children under age 4, call Graduate Pediatric Dentistry at ([phone]) [phone]. Children aged 4-14, please call ([phone]) [phone] to request a pediatric application. ° Dental services are provided in all areas of dental care including fillings, crowns and bridges, complete and partial dentures, implants, gum treatment, root canals, and extractions. Preventive care is also provided. Treatment is provided to both adults and children. °Patients are selected via a lottery and there is often a waiting list. °  °[facility] [address], °[place] ° (336) 763-8833 [email or web address] °  °[facility] [address], [place], [place] (336)723-1848, Ext. 123 Second and Fourth Thursday of each month, opens at 6:30 AM; Clinic ends at 9 AM.  Patients are seen on a first-come first-served basis, and a limited number are seen during each clinic.  ° °[facility] ° [address], [place], [place] (336) 723-7904    Eligibility Requirements °You must have lived in [place], [place], or [place] counties for at least the last three months. °  You cannot be eligible for state or federal sponsored healthcare insurance, including Veterans Administration, Medicaid, or Medicare. °  You generally cannot be eligible for healthcare insurance through your employer.  °  How to apply: °Eligibility screenings are held every Tuesday and Wednesday afternoon from 1:00 pm until 4:00 pm. You do not need an appointment for the interview!  °[facility] [address], [place], [place] 336-631-2330   °[facility]  336-342-8273   °[facility]  336-703-3100   °[facility]  336-570-6415   ° °Behavioral Health Resources in the Community: °Intensive Outpatient Programs °Organization         Address  Phone  Notes  °[facility] [address], [place], [place] 336-878-6098   °[facility] [address], [place], [place] 336-832-9800   °[facility] [address], [place], [place] ° 336-882-2125   °[facility] [address],  °[place], [place] 1-800-853-5163 or 336-641-4981   °Substance Abuse Resources °Organization         Address  Phone  Notes  °[facility]  336-882-2125   °[facility]  336-784-9470   °[facility]  336-285-9073   °[facility]  336-845-3988   °Residential & Outpatient Substance Abuse Program  1-800-659-3381   °Psychological Services °Organization         Address  Phone  Notes  °[facility]  336- 832-9600   °[facility]  336- 378-7881   °[facility] [address], [place] 1-800-853-5163 or 336-641-4981   ° °Mobile Crisis Teams °Organization         Address  Phone  Notes  °[facility], Mobile Crisis Care Unit  1-877-626-1772   °Assertive °[facility] ° [address]  [place], [place] 336-834-9664   °Sharon DeEsch [address] °[place] 336-554-5454   ° °Self-Help/Support Groups °Organization         Address  Phone             Notes  °[facility] [place] - variety of support groups  336- 373-1402 Call for more information  °Narcotics Anonymous (NA), [facility] [address], °[place]  2 meetings at this location  ° °  Residential Treatment Programs Organization         Address  Phone  Notes  [facility] [address],    [place]  1-813-384-4138   [facility]  [address], [place] [id number], [place], [place]   [place], [place] (705) 700-5085 Admissions: 8am-3pm M-F  [facility] [place] [address],    [place], [place] [phone]   [facility] [address] [place], [place], [place]   [facility] [address]  [place], [place]   [facility] - Intensive Outpatient [address], [place], [place]   [facility] [place]  [place], [place] 1-(517) 331-4214 or 820-356-8770   [facility] [address] [place], [place] Accepts Medicaid  [facility] [address],  [place] 1-3651600110 Substance Abuse/Addiction Treatment   [facility] Organization         Address  Phone  Notes  [facility]  716-044-4501   Domenic Schwab, PhD [address] [place], [place]   612-319-8255 or 579-237-4226   [facility], [place] [phone]   [facility] [address] [place], [place] [phone] Insurance/Medicaid/sponsorship through [facility] [address]                                    [place], [place] (934)224-5980 [place] [address], [place] [phone]    Dr. Adele Schilder  [phone]   [facility] Dept. 1) [address], [place] 2) [place] 3)  [address], [place] ([phone]  (830)[phone]   [place] [phone] or [phone] (After Hours)        Ankle Sprain An ankle sprain is an injury to the strong, fibrous tissues (ligaments) that hold the bones of your ankle joint together.  CAUSES An ankle sprain is usually caused by a fall or by twisting your ankle. Ankle sprains most commonly occur when you step on the outer edge of your foot, and your ankle turns inward. People who participate in sports are more prone to these types of injuries.  SYMPTOMS   Pain in your ankle. The pain may be present at rest or only when you are trying to stand or walk.  Swelling.  Bruising. Bruising may develop immediately or within 1 to 2 days after your injury.  Difficulty standing or walking, particularly when turning corners or changing directions. DIAGNOSIS  Your caregiver will ask you details about your injury and perform a physical exam of your ankle to determine if you have an ankle sprain. During the physical exam, your caregiver will press on and apply pressure to specific areas of your foot and ankle. Your caregiver will try to move your ankle in certain ways. An X-ray exam may be done to be sure a bone was not broken or a ligament did not separate from one of the bones in your ankle (avulsion fracture).  TREATMENT  Certain types of braces can help stabilize your ankle. Your caregiver can make  a recommendation for this. Your caregiver may recommend the use of medicine for pain. If your sprain is severe, your caregiver may refer you to a surgeon who helps to restore function to parts of your skeletal system (orthopedist) or a physical therapist. [place] ice to your injury for 1-2 days or as directed by your caregiver. Applying ice helps to reduce inflammation and pain.  Put ice in a plastic bag.  Place a towel between your skin and the bag.  Leave the ice on for 15-20 minutes at a time, every 2 hours while you are awake.  Only take over-the-counter or prescription medicines for pain, discomfort, or fever as directed by your caregiver.  Elevate your injured ankle above the level of your heart as much as possible for 2-3 days.  If your caregiver recommends crutches, use them as instructed. Gradually put weight on the affected ankle. Continue to use crutches or a cane until you can walk without feeling pain in your ankle.  If you have a plaster splint, wear the splint as directed by your caregiver. Do not rest it on anything harder than a pillow for the first 24 hours. Do not put weight on it. Do not get it wet. You may take it off to take a shower or bath.  You may have been given an elastic bandage to wear around your ankle to provide support. If the elastic bandage is too tight (you have numbness or tingling in your foot or your foot becomes cold and blue), adjust the bandage to make it comfortable.  If you have an air splint, you may blow more air into it or let air out to make it more comfortable. You may take your splint off at night and before taking a shower or bath. Wiggle your toes in the splint several times per day to decrease swelling. SEEK MEDICAL CARE IF:   You have rapidly increasing bruising or swelling.  Your toes feel extremely cold or you lose feeling in your foot.  Your pain is not relieved with medicine. SEEK IMMEDIATE MEDICAL CARE IF:  Your toes are numb or blue.  You have severe pain that is increasing. MAKE SURE YOU:   Understand these instructions.  Will watch your condition.  Will get help right away if you are not doing well or get worse.   This information is not intended  to replace advice given to you by your health care provider. Make sure you discuss any questions you have with your health care provider.   Document Released: 03/30/2005 Document Revised: 04/20/2014 Document Reviewed: 04/11/2011 Elsevier Interactive Patient Education 2016 [employer]  Cryotherapy Cryotherapy is when you put ice on your injury. Ice helps lessen pain and puffiness (swelling) after an injury. Ice works the best when you start using it in the first 24 to 48 hours after an injury. HOME CARE  Put a dry or damp towel between the ice pack and your skin.  You may press gently on the ice pack.  Leave the ice on for no more than 10 to 20 minutes at a time.  Check your skin after 5 minutes to make sure your skin is okay.  Rest at least 20 minutes between ice pack uses.  Stop using ice when your skin loses feeling (numbness).  Do not use ice on someone who cannot tell you when it hurts. This includes small children and people with memory problems (dementia). GET HELP RIGHT AWAY IF:  You have white spots on your skin.  Your skin turns blue or pale.  Your skin feels waxy or hard.  Your puffiness gets worse. MAKE SURE YOU:   Understand these instructions.  Will watch your condition.  Will get help right away if you are not doing well or get worse.   This information is not intended to replace advice given to you by your health care provider. Make sure you discuss any questions you have with your health care provider.   Document Released: 09/16/2007 Document Revised: 06/22/2011 Document Reviewed: 11/20/2010 Elsevier Interactive Patient Education [employer].

## 2015-06-10 NOTE — ED Provider Notes (Signed)
CSN: [id number]     Arrival date & time 06/10/15  1433 History  By signing my name below, I, Rayna Sexton, attest that this documentation has been prepared under the direction and in the presence of Delsa Grana, PA-C. Electronically Signed: Rayna Sexton, ED Scribe. 06/10/2015. 5:14 PM.     Chief Complaint  Patient presents with  . Ankle Pain    The history is provided by the patient. No language interpreter was used.    HPI Comments: Vanessa Proctor is a 60 y.o. female who presents to the Emergency Department complaining of constant, moderate, right lateral ankle pain onset 1 day ago. She accidentally stepped on an uneven surface in her driveway resulting in her right ankle rolling and a backwards fall onto the lawn. Her ankle pain worsens with any movement, palpation or when bearing weight. She reports associated, mild, right ankle swelling. She notes a PMHx of LOC in 02/2015 resulting in a fall but denies any LOC or head trauma with yesterday's fall. She confirms her listed blood thinning medications noting that she recently began amlodipine and stopped hydrochlorothiazide due to it causing dehydration. She denies any other associated symptoms at this time.    Past Medical History  Diagnosis Date  . Hypertension   . Depression   . Myocardial infarction [facility])     november 1st 2012  . Coronary artery disease   . Stroke [facility])     november, 2012  . Arthritis     knees  . Nausea & vomiting last month  . Constipation last month  . Rectal bleeding   . Tobacco abuse   . Drug abuse    Past Surgical History  Procedure Laterality Date  . Abdominal hysterectomy  1985    partial  . Coronary angioplasty with stent placement  2012    x 1  . Tonsillectomy  1980  . Knot drained and removed from side of face  2007  . Finger fracture surgery Left     reduction with  splint  . Colonoscopy N/A 04/28/2013    Procedure: COLONOSCOPY;  Surgeon: Beryle Beams, MD;  Location: [facility];   Service: Endoscopy;  Laterality: N/A;   Family History  Problem Relation Age of Onset  . Coronary artery disease    . Cancer Mother     mass in abdomin  . Pancreatic cancer Sister   . Stroke Brother   . Heart attack Maternal Grandfather   . Hypertension Maternal Grandfather    Social History  Substance Use Topics  . Smoking status: Current Every Day Smoker -- 0.25 packs/day for 40 years    Types: Cigarettes  . Smokeless tobacco: Never Used     Comment: trying to quit today 02/05/14  . Alcohol Use: No     Comment: quit drugs 2011  marjuana   OB History    Gravida Para Term Preterm AB TAB SAB Ectopic Multiple Living   2 2 2       2      Review of Systems  All other systems reviewed and are negative.   Allergies  Bactrim and Sulfa antibiotics  Home Medications   Prior to Admission medications   Medication Sig Start Date End Date Taking? Authorizing Provider  albuterol (PROVENTIL HFA;VENTOLIN HFA) 108 (90 BASE) MCG/ACT inhaler Inhale 2 puffs into the lungs every 6 (six) hours as needed for wheezing or shortness of breath. 08/13/14   Gregor Hams, MD  amLODipine (NORVASC) 5 MG tablet Take 1  tablet (5 mg total) by mouth daily. 05/24/15   Larey Dresser, MD  aspirin 81 MG chewable tablet Chew 81 mg by mouth daily after lunch.     Historical Provider, MD  atorvastatin (LIPITOR) 80 MG tablet Take 40 mg by mouth daily. After lunch    Historical Provider, MD  azithromycin (ZITHROMAX) 250 MG tablet Take 1 tablet (250 mg total) by mouth daily. Take first 2 tablets together, then 1 every day until finished. 06/09/15   Samantha Tripp Dowless, PA-C  citalopram (CELEXA) 20 MG tablet Take 20 mg by mouth at bedtime.    Historical Provider, MD  clopidogrel (PLAVIX) 75 MG tablet Take 75 mg by mouth daily. At lunch    Historical Provider, MD  HYDROcodone-acetaminophen (NORCO/VICODIN) 5-325 MG tablet Take 1-2 tablets by mouth every 6 (six) hours as needed for severe pain. 06/10/15   Delsa Grana,  PA-C  ibuprofen (ADVIL,MOTRIN) 400 MG tablet Take 1 tablet (400 mg total) by mouth every 6 (six) hours as needed. 06/10/15   Delsa Grana, PA-C  losartan (COZAAR) 100 MG tablet TAKE ONE TABLET BY MOUTH ONCE DAILY 04/01/15   Larey Dresser, MD  metoprolol tartrate (LOPRESSOR) 25 MG tablet TAKE ONE TABLET BY MOUTH TWICE DAILY 04/01/15   Larey Dresser, MD  nicotine (NICODERM CQ - DOSED IN MG/24 HOURS) 14 mg/24hr patch Place 1 patch onto the skin daily as needed (smoking cessation).  03/28/12   Larey Dresser, MD  polyethylene glycol [facility] Floria Raveling) packet Take 17 g by mouth daily as needed for mild constipation.    Historical Provider, MD   BP 153/84 mmHg  Pulse 89  Temp(Src) 99.6 F (37.6 C) (Oral)  Resp 16  Ht 5\' 1"  (1.549 m)  Wt 73.483 kg  BMI 30.63 kg/m2  SpO2 100%    Physical Exam  Constitutional: She is oriented to person, place, and time. Vital signs are normal. She appears well-developed and well-nourished. She is cooperative.  Non-toxic appearance. She does not have a sickly appearance. No distress.  HENT:  Head: Normocephalic and atraumatic. Head is without raccoon's eyes, without Battle's sign, without abrasion and without contusion.  Nose: Nose normal.  Mouth/Throat: Oropharynx is clear and moist. No oropharyngeal exudate.  Eyes: Conjunctivae and EOM are normal. Pupils are equal, round, and reactive to light. Right eye exhibits no discharge. Left eye exhibits no discharge. No scleral icterus.  Neck: Trachea normal, normal range of motion and phonation normal. Neck supple. No JVD present. No tracheal tenderness, no spinous process tenderness and no muscular tenderness present. No rigidity. No tracheal deviation, no edema, no erythema and normal range of motion present. No thyromegaly present.  Cardiovascular: Normal rate, regular rhythm, normal heart sounds and intact distal pulses.  Exam reveals no gallop and no friction rub.   No murmur heard. Pulmonary/Chest: Effort  normal and breath sounds normal. No stridor. No respiratory distress. She has no wheezes. She has no rales. She exhibits no tenderness.  Abdominal: Soft. Bowel sounds are normal. She exhibits no distension and no mass. There is no tenderness. There is no rebound and no guarding.  Musculoskeletal: She exhibits no edema.       Right knee: Normal.       Right ankle: She exhibits decreased range of motion and swelling. She exhibits no ecchymosis, no deformity, no laceration and normal pulse. Tenderness. Lateral malleolus, AITFL and CF ligament tenderness found. No medial malleolus, no head of 5th metatarsal and no proximal fibula tenderness found.  Achilles tendon normal. Achilles tendon exhibits no pain, no defect and normal Thompson's test results.       Cervical back: Normal.       Thoracic back: Normal.       Lumbar back: Normal.  Lymphadenopathy:    She has no cervical adenopathy.  Neurological: She is alert and oriented to person, place, and time. She has normal reflexes. She displays no tremor. No cranial nerve deficit or sensory deficit. She exhibits normal muscle tone. Coordination normal.  Antalgic gait No facial droop  Skin: Skin is warm and dry. No rash noted. She is not diaphoretic. No erythema. No pallor.  Psychiatric: She has a normal mood and affect. Her speech is rapid and/or pressured and tangential. She is hyperactive. Cognition and memory are normal. She expresses impulsivity. She expresses no homicidal and no suicidal ideation. She expresses no suicidal plans and no homicidal plans.  Rapid speech with flight of ideas, appears impulsive, hyperactive and euphoric  Nursing note and vitals reviewed.   ED Course  Procedures  DIAGNOSTIC STUDIES: Oxygen Saturation is 100% on RA, normal by my interpretation.    COORDINATION OF CARE: 5:12 PM Discussed imaging results and next steps with pt. She verbalized understanding and is agreeable with the plan.   Labs Review  Results for  orders placed or performed during the hospital encounter of 06/10/15  Comprehensive metabolic panel  Result Value Ref Range   Sodium 137 135 - 145 mmol/L   Potassium 3.0 (L) 3.5 - 5.1 mmol/L   Chloride 102 101 - 111 mmol/L   CO2 25 22 - 32 mmol/L   Glucose, Bld 135 (H) 65 - 99 mg/dL   BUN 10 6 - 20 mg/dL   Creatinine, Ser 0.74 0.44 - 1.00 mg/dL   Calcium 9.5 8.9 - 10.3 mg/dL   Total Protein 7.2 6.5 - 8.1 g/dL   Albumin 3.6 3.5 - 5.0 g/dL   AST 58 (H) 15 - 41 U/L   ALT 37 14 - 54 U/L   Alkaline Phosphatase 54 38 - 126 U/L   Total Bilirubin 0.8 0.3 - 1.2 mg/dL   GFR calc non Af Amer >60 >60 mL/min   GFR calc Af Amer >60 >60 mL/min   Anion gap 10 5 - 15  Ethanol  Result Value Ref Range   Alcohol, Ethyl (B) <5 <5 mg/dL  CBC with Diff  Result Value Ref Range   WBC 4.4 4.0 - 10.5 K/uL   RBC 4.28 3.87 - 5.11 MIL/uL   Hemoglobin 12.2 12.0 - 15.0 g/dL   HCT 36.8 36.0 - 46.0 %   MCV 86.0 78.0 - 100.0 fL   MCH 28.5 26.0 - 34.0 pg   MCHC 33.2 30.0 - 36.0 g/dL   RDW 13.5 11.5 - 15.5 %   Platelets 222 150 - 400 K/uL   Neutrophils Relative % 61 %   Neutro Abs 2.7 1.7 - 7.7 K/uL   Lymphocytes Relative 23 %   Lymphs Abs 1.0 0.7 - 4.0 K/uL   Monocytes Relative 12 %   Monocytes Absolute 0.5 0.1 - 1.0 K/uL   Eosinophils Relative 4 %   Eosinophils Absolute 0.2 0.0 - 0.7 K/uL   Basophils Relative 0 %   Basophils Absolute 0.0 0.0 - 0.1 K/uL  Urine rapid drug screen (hosp performed)not at [facility]  Result Value Ref Range   Opiates NONE DETECTED NONE DETECTED   Cocaine NONE DETECTED NONE DETECTED   Benzodiazepines NONE DETECTED NONE DETECTED  Amphetamines NONE DETECTED NONE DETECTED   Tetrahydrocannabinol POSITIVE (A) NONE DETECTED   Barbiturates NONE DETECTED NONE DETECTED  Urinalysis, Routine w reflex microscopic (not at [facility])  Result Value Ref Range   Color, Urine YELLOW YELLOW   APPearance CLEAR CLEAR   Specific Gravity, Urine 1.016 1.005 - 1.030   pH 6.0 5.0 - 8.0   Glucose, UA  NEGATIVE NEGATIVE mg/dL   Hgb urine dipstick NEGATIVE NEGATIVE   Bilirubin Urine NEGATIVE NEGATIVE   Ketones, ur NEGATIVE NEGATIVE mg/dL   Protein, ur NEGATIVE NEGATIVE mg/dL   Nitrite NEGATIVE NEGATIVE   Leukocytes, UA NEGATIVE NEGATIVE   Results for orders placed or performed during the hospital encounter of 06/10/15  Comprehensive metabolic panel  Result Value Ref Range   Sodium 137 135 - 145 mmol/L   Potassium 3.0 (L) 3.5 - 5.1 mmol/L   Chloride 102 101 - 111 mmol/L   CO2 25 22 - 32 mmol/L   Glucose, Bld 135 (H) 65 - 99 mg/dL   BUN 10 6 - 20 mg/dL   Creatinine, Ser 0.74 0.44 - 1.00 mg/dL   Calcium 9.5 8.9 - 10.3 mg/dL   Total Protein 7.2 6.5 - 8.1 g/dL   Albumin 3.6 3.5 - 5.0 g/dL   AST 58 (H) 15 - 41 U/L   ALT 37 14 - 54 U/L   Alkaline Phosphatase 54 38 - 126 U/L   Total Bilirubin 0.8 0.3 - 1.2 mg/dL   GFR calc non Af Amer >60 >60 mL/min   GFR calc Af Amer >60 >60 mL/min   Anion gap 10 5 - 15  Ethanol  Result Value Ref Range   Alcohol, Ethyl (B) <5 <5 mg/dL  CBC with Diff  Result Value Ref Range   WBC 4.4 4.0 - 10.5 K/uL   RBC 4.28 3.87 - 5.11 MIL/uL   Hemoglobin 12.2 12.0 - 15.0 g/dL   HCT 36.8 36.0 - 46.0 %   MCV 86.0 78.0 - 100.0 fL   MCH 28.5 26.0 - 34.0 pg   MCHC 33.2 30.0 - 36.0 g/dL   RDW 13.5 11.5 - 15.5 %   Platelets 222 150 - 400 K/uL   Neutrophils Relative % 61 %   Neutro Abs 2.7 1.7 - 7.7 K/uL   Lymphocytes Relative 23 %   Lymphs Abs 1.0 0.7 - 4.0 K/uL   Monocytes Relative 12 %   Monocytes Absolute 0.5 0.1 - 1.0 K/uL   Eosinophils Relative 4 %   Eosinophils Absolute 0.2 0.0 - 0.7 K/uL   Basophils Relative 0 %   Basophils Absolute 0.0 0.0 - 0.1 K/uL  Urine rapid drug screen (hosp performed)not at [facility]  Result Value Ref Range   Opiates NONE DETECTED NONE DETECTED   Cocaine NONE DETECTED NONE DETECTED   Benzodiazepines NONE DETECTED NONE DETECTED   Amphetamines NONE DETECTED NONE DETECTED   Tetrahydrocannabinol POSITIVE (A) NONE DETECTED    Barbiturates NONE DETECTED NONE DETECTED  Urinalysis, Routine w reflex microscopic (not at [facility])  Result Value Ref Range   Color, Urine YELLOW YELLOW   APPearance CLEAR CLEAR   Specific Gravity, Urine 1.016 1.005 - 1.030   pH 6.0 5.0 - 8.0   Glucose, UA NEGATIVE NEGATIVE mg/dL   Hgb urine dipstick NEGATIVE NEGATIVE   Bilirubin Urine NEGATIVE NEGATIVE   Ketones, ur NEGATIVE NEGATIVE mg/dL   Protein, ur NEGATIVE NEGATIVE mg/dL   Nitrite NEGATIVE NEGATIVE   Leukocytes, UA NEGATIVE NEGATIVE    Ct Head Wo Contrast  06/10/2015  CLINICAL DATA:  Slipped and fell tonight.  Hit head. EXAM: CT HEAD WITHOUT CONTRAST TECHNIQUE: Contiguous axial images were obtained from the base of the skull through the vertex without intravenous contrast. COMPARISON:  CT head 03/03/2015 FINDINGS: Stable age advanced cerebral atrophy, ventriculomegaly and periventricular white matter disease. Stable bilateral basal ganglia calcifications. No findings for acute hemispheric infarction intracranial hemorrhage. No mass lesions. No extra-axial fluid collections. The brainstem and cerebellum are grossly normal despite motion artifact. Axillary and ethmoid sinus disease. The mastoid air cells and middle ear cavities are clear. The globes are intact. No skull fracture. IMPRESSION: Stable age advanced cerebral atrophy, ventriculomegaly and periventricular white matter disease. No acute intracranial findings or mass lesion. Ethmoid and maxillary sinus disease. Electronically Signed   By: Marijo Sanes M.D.   On: 06/10/2015 19:25    Dg Ankle Complete Right  06/10/2015  CLINICAL DATA:  Injured ankle today. EXAM: RIGHT ANKLE - COMPLETE 3+ VIEW COMPARISON:  None. FINDINGS: The ankle mortise is maintained. No acute ankle fracture or osteochondral abnormality. No ankle joint effusion. The mid and hindfoot bony structures are intact. Calcaneal spurring changes are noted. IMPRESSION: No acute ankle fracture. Electronically Signed   By: Marijo Sanes M.D.   On: 06/10/2015 16:46   I have personally reviewed and evaluated these images as part of my medical decision-making.   EKG Interpretation None      MDM   Patient is a 59 year old female with complicated medical history including for coronary drug-eluting stents with dual antiplatelet therapy, history of CVA, hypertension, syncope, anxiety depressed and maintained on Celexa, and multiple other medical problems, she presented to the emergency department yesterday for evaluation of bronchitis in today for evaluation of right ankle pain. She states that she had a mechanical fall twisted her ankle in a (whole in her front yard and subsequently fell backwards. She states that she has 95% sure she did not hit her head. Her daughter and granddaughter at the bedside and states that she has not been behaving normally over the past week or 2, including erratic behavior such as speeding on the freeway, severe insomnia, euphoria, flight of ideas, etc.  Pt denies SI, HI, AVH, "feels the best I've felt in a long time."  Her daughter and other family members are concerned about acute behavioral change in the past 2 weeks.  Pt ankle was evaluated and x-ray was negative for fracture or dislocation. She was placed in the ASO splint and was able to ambulate with her walker.  Basic labs and head CT were obtained as well as TTS consult.  Labs pertinent for hypokalemia 3.0, consistent with her history, repleted in the ER.  Other labs are unremarkable, UA negative, UDS + for THC. Head CT is negative for acute change from prior scans.   Pt does not have a hx of bipolar or mania, but has hx of anxiety and depression.  Celexa was initiated in mid December 2016.  Pt may need further psychiatric evaluation for medication change.  Appreciate recommendations of TTS whether she is appropriate to inpatient or outpatient workup.  Disposition according to TTS consult and recommendations.  [facility] consult  pending.  Final diagnoses:  Left ankle strain, initial encounter  Fall, initial encounter   I personally performed the services described in this documentation, which was scribed in my presence. The recorded information has been reviewed and is accurate.      Delsa Grana, PA-C 06/10/15 2012  Delsa Grana, PA-C 06/10/15 2020  Gareth Morgan, MD 06/11/15 [phone]

## 2015-06-10 NOTE — ED Notes (Signed)
Dr. Billy Fischer spoke with patient at length.  Patient will follow up with psychiatry outpatient/

## 2015-06-10 NOTE — Progress Notes (Addendum)
Pt confirmed with pt that pcp is Dr Doreene Burke at [place] states she has seen Dr Marigene Ehlers CV   Pt requested a blanket and tissue These were provided to her  Pt with last hospitalization 03/03/15 for syncope Pt with 3 ED visits and 1 admission in last 6 months

## 2015-06-10 NOTE — Telephone Encounter (Signed)
I spoke with the pt and she injured her right ankle this morning when she fell in her yard.  Apparently the city was at the water meter this morning and they were going to shut off her neighbor's water and they did not place the grate back on top of the hole. The pt was getting out of her car when she stepped in the hole. The pt complains of swelling and pain in her right ankle.  I advised the pt that she needs to contact her PCP for further assessment.  The pt said she is just going to go to the ER for evaluation.  I once again advised her to touch base with her PCP.

## 2015-06-10 NOTE — ED Notes (Signed)
Dr. Billy Fischer at the bedside.

## 2015-06-10 NOTE — ED Notes (Signed)
Pt c/o right ankle pain after turning her ankle in the yard.  No additional injuries reported.  Pain 8/10.

## 2015-06-10 NOTE — ED Notes (Signed)
Patient transported to CT 

## 2015-06-10 NOTE — Telephone Encounter (Signed)
Pt fell this morning,she hurt her leg,and ankles. Her ankle is swollen and bruised,she wants to know what she should do?

## 2015-06-11 NOTE — Telephone Encounter (Signed)
Left message to call back  

## 2015-06-11 NOTE — Telephone Encounter (Signed)
Called the pt back to obtain BP readings, and per the pt, she has been unable to take her BP, for she has been busy and hasn't had the time to buy a cuff.  Per the pt, she said that she was instructed by an RN in our office to buy a manual BP cuff and stethoscope to obtain her BP readings, vs using the automatic wrist or arm cuff.  Pt states she hasn't purchased this yet, for she doesn't have the funds too.  Informed the pt that she was given incorrect instructions, and she is not expected to take her own BP manually.  Informed the pt that even skilled providers in healthcare would never take their own BP this way, for it would be inaccurate.  Pt did however say that she does have an automatic BP cuff at home.  Advised the pt that using her automated cuff is very appropriate, and she should take her BP once daily, at the same time everyday, for the next 3 days.  Advised the pt to log her BP readings down, with the date, and call our office this Friday 2/28.  Pt states she does have one reading from yesterdays Doctor appt she went too.  BP was 154/83 and no HR reported.  Pt verbalized understanding and agrees with this plan.  Will leave this message on triage desktop to follow-up with the pt for this Friday 06/14/15, to obtain pts 3 day-logged BP readings.

## 2015-06-11 NOTE — Telephone Encounter (Signed)
Mrs.Seyller is returning your call

## 2015-06-12 ENCOUNTER — Telehealth: Payer: Self-pay | Admitting: Cardiology

## 2015-06-12 NOTE — Telephone Encounter (Signed)
Pt wants to know if she can take mentholated cough drops? She has Bronchitis and she just want to be sure she can take them a long with her other medicine.

## 2015-06-12 NOTE — Telephone Encounter (Signed)
Spoke with the pt to inform her that I went to speak with our Pharmacist Megan and she reviewed all her meds, and states there is no contraindication with what meds she is taking, and using mentholated cough drops.  Did advise the pt that she should purchase the sugar free cough drops, for if she gets the cough drops with sugar, this could cause her BS to become elevated.  Pt verbalized understanding and agrees with this plan.

## 2015-06-13 ENCOUNTER — Telehealth: Payer: Self-pay | Admitting: Cardiology

## 2015-06-13 ENCOUNTER — Telehealth (HOSPITAL_COMMUNITY): Payer: Self-pay

## 2015-06-13 NOTE — Telephone Encounter (Addendum)
Spoke with pt and she states that she was accidentally cutting her Amlodipine in half instead of her Metoprolol. Pt was afraid that taking these meds incorrectly may cause a change in her mentality. Advised pt that this was unlikely, especially since she was recently on the higher dose (25mg ) of Metoprolol anyway. Advised pt to take her meds correctly, monitor her BP and HR and call us if there are any problems. Pt denies dizziness, lightheadedness or fatigue. Pt states she feels fine and agrees to advisement.

## 2015-06-13 NOTE — Telephone Encounter (Signed)
Pt calling to see if she has any limitations post fall getting into car.  Informed no limitations noted on AVS.  Informed do not drive while taking  vicodin.

## 2015-06-13 NOTE — Telephone Encounter (Signed)
Vanessa Proctor is calling because she states that she may have taken the wrong medication, she is on Amolodpine and Metoprolol . Wants to know will that have a some effect on her mentality.

## 2015-06-14 ENCOUNTER — Telehealth: Payer: Self-pay | Admitting: Cardiology

## 2015-06-14 NOTE — Telephone Encounter (Signed)
New message       Pt c/o BP issue: STAT if pt c/o blurred vision, one-sided weakness or slurred speech  1. What are your last 5 BP readings? Feb 28th 127/53 HR 73, march 1 151/74 HR 76. March 2 127/70 HR 81, march 3 160/83 HR 80 2. Are you having any other symptoms (ex. Dizziness, headache, blurred vision, passed out)? Little blurred vision--pt fell Monday and went to the ER 3. What is your BP issue?  Calling to give bp readings.  Pt states that she may have mixed up her metoprolol and amlodipine.

## 2015-06-14 NOTE — Telephone Encounter (Signed)
Pt fell after her foot was caught and twisted in a hole, Not due to meds. Pt was unsure if meds were mixed up but has reviewed her pill minder and all are correct.

## 2015-06-14 NOTE — Telephone Encounter (Signed)
See phone note from 3/3

## 2015-06-14 NOTE — Telephone Encounter (Signed)
BP up and down.  Would have her make sure she is taking her meds like they are listed on her med rec sheet and call back with daily BP readings in a week.

## 2015-06-14 NOTE — Telephone Encounter (Signed)
**Note De-identified Vanessa Proctor Obfuscation** The pt is advised and she verbalized understanding. 

## 2015-06-18 ENCOUNTER — Telehealth: Payer: Self-pay | Admitting: Cardiology

## 2015-06-18 NOTE — Telephone Encounter (Signed)
Patient complained of chest pain and SOB on Sunday that lasted about 15 minutes. Patient stated this has happen twice since Sunday. Patient stated she does not have any chest pain at this time. Made patient an appointment with Truitt Merle NP for tomorrow morning. Encouraged patient to go to ED if chest pain comes back. Patient verbalized understanding.

## 2015-06-18 NOTE — Telephone Encounter (Signed)
Left message to call back  

## 2015-06-18 NOTE — Telephone Encounter (Signed)
Pt c/o of Chest Pain: STAT if CP now or developed within 24 hours  1. Are you having CP right now? no  2. Are you experiencing any other symptoms (ex. SOB, nausea, vomiting, sweating)? Little SOB  3. How long have you been experiencing CP? Since Sunday 06/16/15  4. Is your CP continuous or coming and going? Comes and goes- just twice since Sunday 06/16/15  5. Have you taken Nitroglycerin? no ?

## 2015-06-19 ENCOUNTER — Encounter: Payer: Self-pay | Admitting: Nurse Practitioner

## 2015-06-19 ENCOUNTER — Ambulatory Visit (INDEPENDENT_AMBULATORY_CARE_PROVIDER_SITE_OTHER): Payer: Medicare Other | Admitting: Nurse Practitioner

## 2015-06-19 VITALS — BP 140/82 | HR 80 | Ht 61.0 in | Wt 159.1 lb

## 2015-06-19 DIAGNOSIS — R0600 Dyspnea, unspecified: Secondary | ICD-10-CM

## 2015-06-19 DIAGNOSIS — I10 Essential (primary) hypertension: Secondary | ICD-10-CM

## 2015-06-19 DIAGNOSIS — E785 Hyperlipidemia, unspecified: Secondary | ICD-10-CM

## 2015-06-19 DIAGNOSIS — I2510 Atherosclerotic heart disease of native coronary artery without angina pectoris: Secondary | ICD-10-CM

## 2015-06-19 DIAGNOSIS — F17200 Nicotine dependence, unspecified, uncomplicated: Secondary | ICD-10-CM

## 2015-06-19 DIAGNOSIS — R079 Chest pain, unspecified: Secondary | ICD-10-CM | POA: Diagnosis not present

## 2015-06-19 MED ORDER — NITROGLYCERIN 0.4 MG SL SUBL: 0.4000 mg | SUBLINGUAL_TABLET | SUBLINGUAL | Status: DC | PRN

## 2015-06-19 NOTE — Progress Notes (Signed)
CARDIOLOGY OFFICE NOTE  Date:  06/19/2015    Vanessa Proctor Date of Birth: 1955-05-07 Medical Record [id number]  PCP:  Angelica Chessman, MD  Cardiologist:  Aundra Dubin    Chief Complaint  Patient presents with  . Chest Pain  . Shortness of Breath    Work in visit - seen for Dr. Aundra Dubin    History of Present Illness: Vanessa Proctor is a 60 y.o. female who presents today for a work in visit. Seen for Dr. Aundra Dubin.   She has a history of CAD s/p NSTEMI and CVA. She has ongoing tobacco abuse.  She had NSTEMI with PCI to RCA with DES in 11/12. A few weeks after the NSTEMI, she had a CVA. EF was preserved on echo. She had more chest pain last summer and had a Cardiolite in 8/16 with no ischemia, low risk.   She had an episodes of syncope in 11/16. She had been standing for a long time and had had little to drink that day. She was hospitalized, and episode was thought to be vagal. MRI showed no CVA, 30 day monitor was unremarkable, echo and carotid dopplers also were unremarkable. HCTZ was stopped due to possible dehydration.   Seen last month and was felt to be doing ok.   Several phone calls since her last visit. She had a lone spell where her HR was 48 - metoprolol cut back - looks like she cut back Norvasc. Had a fall. Then had chest pain over the weekend.   Comes back today. Here alone.  She has not had any of her medicines yet today. Had a "sharp pain" after church on Sunday and again on Monday. Sunday's episode was precipitated by some stressful news from her daughter. Monday's spell lasted less time. Pretty vague about the description. She had no associated symptoms such as shortness of breath, sweating, clammy, etc. Did not have any NTG to take. Says she is not smoking - but she is. No exertional symptoms other than DOE and she has recurrent bronchitis. Still smoking marijuana "to deal with my anxiety" and that she does not smoke with other people. Then starts  talking about how she had a psyche referral placed during her last ER visit - ? Of bipolar.   PMH: 1. HTN 2. Depression 3. CAD: NSTEMI in 11/12. LHC with EF 55-60%, basal inferior HK, 99% mRCA stenosis, 40% OM1. Patient had Promus DES to RCA. Cardiolite (8/16) with fixed small anteroseptal defect likely artifcact, no ischemia, EF 69%, low risk.  4. CVA: 11/12, right external capsule. Thought to be due to small vessel disease. Carotid dopplers showed no significant stenosis. Carotid dopplers 11/15 with minimal disease.  5. Elevated transaminases: Mild, with statin use.  6. ABIs (9/13): Normal.  7. Active smoker.  8. Colon polyps 9. Syncope: 30 day monitor (11/16) with rare PVCs. Echo (11/16) with EF 123456, normal diastolic function, normal RV size and systolic function. Carotid doppler (11/16) with minimal stenosis.    Past Medical History  Diagnosis Date  . Hypertension   . Depression   . Myocardial infarction [facility])     november 1st 2012  . Coronary artery disease   . Stroke [facility])     november, 2012  . Arthritis     knees  . Nausea & vomiting last month  . Constipation last month  . Rectal bleeding   . Tobacco abuse   . Drug abuse     Past Surgical History  Procedure Laterality Date  . Abdominal hysterectomy  1985    partial  . Coronary angioplasty with stent placement  2012    x 1  . Tonsillectomy  1980  . Knot drained and removed from side of face  2007  . Finger fracture surgery Left     reduction with  splint  . Colonoscopy N/A 04/28/2013    Procedure: COLONOSCOPY;  Surgeon: Beryle Beams, MD;  Location: [facility] ENDOSCOPY;  Service: Endoscopy;  Laterality: N/A;     Medications: Current Outpatient Prescriptions  Medication Sig Dispense Refill  . albuterol (PROVENTIL HFA;VENTOLIN HFA) 108 (90 BASE) MCG/ACT inhaler Inhale 2 puffs into the lungs every 6 (six) hours as needed for wheezing or shortness of breath. 1 Inhaler 2  . amLODipine (NORVASC) 5 MG tablet  Take 1 tablet (5 mg total) by mouth daily. 90 tablet 1  . aspirin 81 MG chewable tablet Chew 81 mg by mouth daily after lunch.     Marland Kitchen atorvastatin (LIPITOR) 80 MG tablet Take 40 mg by mouth daily. After lunch    . citalopram (CELEXA) 20 MG tablet Take 20 mg by mouth at bedtime.    . clopidogrel (PLAVIX) 75 MG tablet Take 75 mg by mouth daily. At lunch    . ibuprofen (ADVIL,MOTRIN) 400 MG tablet Take 1 tablet (400 mg total) by mouth every 6 (six) hours as needed. 30 tablet 0  . losartan (COZAAR) 100 MG tablet TAKE ONE TABLET BY MOUTH ONCE DAILY 30 tablet 11  . metoprolol tartrate (LOPRESSOR) 25 MG tablet TAKE ONE TABLET BY MOUTH TWICE DAILY 60 tablet 11  . nitroGLYCERIN (NITROSTAT) 0.4 MG SL tablet Place 1 tablet (0.4 mg total) under the tongue every 5 (five) minutes as needed for chest pain. 90 tablet 3   No current facility-administered medications for this visit.    Allergies: Allergies  Allergen Reactions  . Bactrim Rash  . Sulfa Antibiotics Rash    Social History: The patient  reports that she has been smoking Cigarettes.  She has a 10 pack-year smoking history. She has never used smokeless tobacco. She reports that she uses illicit drugs (Marijuana). She reports that she does not drink alcohol.   Family History: The patient's family history includes Cancer in her mother; Heart attack in her maternal grandfather; Hypertension in her maternal grandfather; Pancreatic cancer in her sister; Stroke in her brother.   Review of Systems: Please see the history of present illness.   Otherwise, the review of systems is positive for none.   All other systems are reviewed and negative.   Physical Exam: VS:  BP 140/82 mmHg  Pulse 80  Ht 5\' 1"  (1.549 m)  Wt 159 lb 1.9 oz (72.176 kg)  BMI 30.08 kg/m2 .  BMI Body mass index is 30.08 kg/(m^2).  Wt Readings from Last 3 Encounters:  06/19/15 159 lb 1.9 oz (72.176 kg)  06/10/15 162 lb (73.483 kg)  05/24/15 162 lb (73.483 kg)    General:  Alert. Smells of tobacco. She is alert and in no acute distress.  HEENT: Normal but has no teeth. Neck: Supple, no JVD, carotid bruits, or masses noted.  Cardiac: Regular rate and rhythm. No murmurs, rubs, or gallops. No edema.  Respiratory:  Lungs are coarse to auscultation bilaterally with normal work of breathing.  GI: Soft and nontender.  MS: No deformity or atrophy. Gait and ROM intact. Skin: Warm and dry. Color is normal.  Neuro:  Strength and sensation are intact and no gross  focal deficits noted.  Psych: Alert, appropriate and with normal affect.   LABORATORY DATA:  EKG:  EKG is ordered today. This demonstrates NSR with PVC - no ST or T wave changes.  Lab Results  Component Value Date   WBC 4.4 06/10/2015   HGB 12.2 06/10/2015   HCT 36.8 06/10/2015   PLT 222 06/10/2015   GLUCOSE 135* 06/10/2015   CHOL 103 03/04/2015   TRIG 48 03/04/2015   HDL 35* 03/04/2015   LDLCALC 58 03/04/2015   ALT 37 06/10/2015   AST 58* 06/10/2015   NA 137 06/10/2015   K 3.0* 06/10/2015   CL 102 06/10/2015   CREATININE 0.74 06/10/2015   BUN 10 06/10/2015   CO2 25 06/10/2015   TSH 1.43 11/28/2014   INR 1.05 03/03/2015   HGBA1C 6.3* 03/04/2015   MICROALBUR 0.98 05/15/2010    BNP (last 3 results) No results for input(s): BNP in the last 8760 hours.  ProBNP (last 3 results) No results for input(s): PROBNP in the last 8760 hours.   Other Studies Reviewed Today:  Echo Study Conclusions from 02/2015  - Left ventricle: The cavity size was normal. Wall thickness was  normal. Systolic function was normal. The estimated ejection  fraction was in the range of 60% to 65%. Wall motion was normal;  there were no regional wall motion abnormalities. Left  ventricular diastolic function parameters were normal. - Aortic valve: There was no stenosis. - Mitral valve: There was trivial regurgitation. - Right ventricle: The cavity size was normal. Systolic function  was normal. - Tricuspid  valve: Peak RV-RA gradient (S): 20 mm Hg. - Pulmonary arteries: PA peak pressure: 23 mm Hg (S). - Inferior vena cava: The vessel was normal in size. The  respirophasic diameter changes were in the normal range (>= 50%),  consistent with normal central venous pressure.  Impressions:  - Normal LV size and systolic function, EF 123456. Normal diastolic  function. Normal RV size and systolic function. No significant  valvular abnormalities.   Myoview Study Highlights from August 2016     Nuclear stress EF: 69%.  There was no ST segment deviation noted during stress.  This is a low risk study.  The left ventricular ejection fraction is hyperdynamic (>65%).  There was a fixed, small mild mid anteroseptal perfusion defect. Given normal wall motion, I think that this is most likely attenuation. No ischemia. Low risk study. Of note, frequent PVCs with exercise.      Assessment/Plan: 1. Chest pain - seems atypical. Have sent in RX for NTG. Low risk Myoview back in August. Multiple risk factors. Would continue to follow for now. She has had no exertional symptoms.  If continues/persists/worsens - will need further testing. Reminded in how to use NTG. Needs to stop smoking.   2. Known CAD with prior NSTEMI and PCI/stent to the RCA in 2012 - low risk Myoview from August of 2016.   3. HTN - no medicines today.   4. Prior CVA  5. HLD - on statin  6. Tobacco abuse/drug use - total cessation imperative. She is not ready to stop.   Current medicines are reviewed with the patient today.  The patient does not have concerns regarding medicines other than what has been noted above.  The following changes have been made:  See above.  Labs/ tests ordered today include:    Orders Placed This Encounter  Procedures  . EKG 12-Lead     Disposition:   FU with Dr.  McLean as planned.   Patient is agreeable to this plan and will call if any problems develop in the interim.    Signed: Burtis Junes, RN, ANP-C 06/19/2015 9:04 AM  [facility] [address] [place], [place]  [address] Phone: [phone] Fax: [phone]

## 2015-06-19 NOTE — Patient Instructions (Addendum)
We will be checking the following labs today - NONE   Medication Instructions:    Continue with your current medicines.  I sent in a RX for NTG to your drug store - Use your NTG under your tongue for recurrent chest pain. May take one tablet every 5 minutes. If you are still having discomfort after 3 tablets in 15 minutes, call 911.    Testing/Procedures To Be Arranged:  N/A  Follow-Up:   See Dr. Aundra Dubin as planned.     Other Special Instructions:   Need to stop smoking altogether.  If you continue to have episodes of chest pain, you need to come back and see [place] again.     If you need a refill on your cardiac medications before your next appointment, please call your pharmacy.   Call the [place] office at [phone] if you have any questions, problems or concerns.

## 2015-06-24 ENCOUNTER — Inpatient Hospital Stay: Payer: Self-pay | Admitting: Internal Medicine

## 2015-06-24 ENCOUNTER — Telehealth: Payer: Self-pay | Admitting: *Deleted

## 2015-06-24 NOTE — Telephone Encounter (Signed)
Walk in.  States she was at a club Saturday nite and had a few drinks and went down some steps and fell.  Yesterday she noticed some bruising at (L) arm pit and came in for someone to look at the bruise. Advised her that we do not see walk-ins and would have to make an appointment or go to her PCP or urgent care.  She proceeds to unbutton her blouse (3 buttons) to show me the bruise.  Again advised that would not be able to evaluate her.  She states she had an appointment with  her PCP but was 5 min late and they made her reschedule so she came here.  States she does have an appointment tomorrow and encouraged her to keep the appointment. Suggested she button up her blouse before going back into the lobby which she did.

## 2015-06-25 ENCOUNTER — Ambulatory Visit: Payer: Medicare Other | Attending: Internal Medicine | Admitting: Internal Medicine

## 2015-06-25 DIAGNOSIS — Z5321 Procedure and treatment not carried out due to patient leaving prior to being seen by health care provider: Secondary | ICD-10-CM

## 2015-06-26 ENCOUNTER — Telehealth: Payer: Self-pay | Admitting: Cardiology

## 2015-06-26 NOTE — Telephone Encounter (Signed)
Calling stating she went to [facility] yesterday but didn't have the copay and was rescheduled to [facility] 3/27.  She is calling here to see if we can do a TSH to see if that is why she is falling.  Advised would need to get the [facility] physician to check. They checked it in 2015.  She verbalizes understanding and will mention this to physician when seen.

## 2015-06-26 NOTE — Telephone Encounter (Signed)
Follow up ° ° ° ° ° °Returning a call to the nurse °

## 2015-06-26 NOTE — Progress Notes (Signed)
Pt was not seen in clinic, left before being seen.

## 2015-06-26 NOTE — Telephone Encounter (Signed)
New Message:  Pt called in wanting to speak with a nurse about some of the reasons why she keeps falling. She says that she has a f/u appt with [facility] but she would like to ask a few questions before hand. Please f/u with her

## 2015-06-26 NOTE — Telephone Encounter (Signed)
[email or web address]

## 2015-07-02 ENCOUNTER — Other Ambulatory Visit: Payer: Self-pay | Admitting: Cardiology

## 2015-07-04 ENCOUNTER — Other Ambulatory Visit: Payer: Self-pay | Admitting: *Deleted

## 2015-07-04 MED ORDER — ATORVASTATIN CALCIUM 80 MG PO TABS: 40.0000 mg | ORAL_TABLET | Freq: Every day | ORAL | Status: DC

## 2015-07-04 NOTE — Telephone Encounter (Signed)
REFILL 

## 2015-07-08 ENCOUNTER — Inpatient Hospital Stay: Payer: Medicare Other | Admitting: Internal Medicine

## 2015-07-15 ENCOUNTER — Inpatient Hospital Stay: Payer: Medicare Other | Admitting: Internal Medicine

## 2015-07-16 ENCOUNTER — Encounter: Payer: Self-pay | Admitting: Internal Medicine

## 2015-07-16 ENCOUNTER — Ambulatory Visit: Payer: Medicare Other | Attending: Internal Medicine | Admitting: Internal Medicine

## 2015-07-16 ENCOUNTER — Telehealth: Payer: Self-pay

## 2015-07-16 VITALS — BP 150/83 | HR 83 | Temp 98.0°F | Resp 16 | Ht 61.0 in | Wt 155.8 lb

## 2015-07-16 DIAGNOSIS — Z79899 Other long term (current) drug therapy: Secondary | ICD-10-CM | POA: Diagnosis not present

## 2015-07-16 DIAGNOSIS — Z7982 Long term (current) use of aspirin: Secondary | ICD-10-CM | POA: Diagnosis not present

## 2015-07-16 DIAGNOSIS — I2510 Atherosclerotic heart disease of native coronary artery without angina pectoris: Secondary | ICD-10-CM

## 2015-07-16 DIAGNOSIS — Z1239 Encounter for other screening for malignant neoplasm of breast: Secondary | ICD-10-CM

## 2015-07-16 DIAGNOSIS — F17210 Nicotine dependence, cigarettes, uncomplicated: Secondary | ICD-10-CM | POA: Diagnosis not present

## 2015-07-16 DIAGNOSIS — I10 Essential (primary) hypertension: Secondary | ICD-10-CM

## 2015-07-16 DIAGNOSIS — R634 Abnormal weight loss: Secondary | ICD-10-CM

## 2015-07-16 DIAGNOSIS — F17200 Nicotine dependence, unspecified, uncomplicated: Secondary | ICD-10-CM

## 2015-07-16 DIAGNOSIS — K5900 Constipation, unspecified: Secondary | ICD-10-CM

## 2015-07-16 DIAGNOSIS — Z1231 Encounter for screening mammogram for malignant neoplasm of breast: Secondary | ICD-10-CM

## 2015-07-16 LAB — TSH: TSH: 0.73 mIU/L

## 2015-07-16 MED ORDER — AMLODIPINE BESYLATE 5 MG PO TABS: 5.0000 mg | ORAL_TABLET | Freq: Two times a day (BID) | ORAL | Status: DC

## 2015-07-16 NOTE — Telephone Encounter (Signed)
Called patient this am to confirm she is coming in for her appointment

## 2015-07-16 NOTE — Progress Notes (Signed)
Patient here for follow up from her fall Has sprained both of her ankles Today she is concerned about the fact she has been loosing weight

## 2015-07-16 NOTE — Patient Instructions (Addendum)
2 wks fu w/ [facility] for bp chk - High-Fiber Diet Fiber, also called dietary fiber, is a type of carbohydrate found in fruits, vegetables, whole grains, and beans. A high-fiber diet can have many health benefits. Your health care provider may recommend a high-fiber diet to help:  Prevent constipation. Fiber can make your bowel movements more regular.  Lower your cholesterol.  Relieve hemorrhoids, uncomplicated diverticulosis, or irritable bowel syndrome.  Prevent overeating as part of a weight-loss plan.  Prevent heart disease, type 2 diabetes, and certain cancers. WHAT IS MY PLAN? The recommended daily intake of fiber includes:  38 grams for men under age 77.  32 grams for men over age 37.  75 grams for women under age 63.  41 grams for women over age 39. You can get the recommended daily intake of dietary fiber by eating a variety of fruits, vegetables, grains, and beans. Your health care provider may also recommend a fiber supplement if it is not possible to get enough fiber through your diet. WHAT DO I NEED TO KNOW ABOUT A HIGH-FIBER DIET?  Fiber supplements have not been widely studied for their effectiveness, so it is better to get fiber through food sources.  Always check the fiber content on thenutrition facts label of any prepackaged food. Look for foods that contain at least 5 grams of fiber per serving.  Ask your dietitian if you have questions about specific foods that are related to your condition, especially if those foods are not listed in the following section.  Increase your daily fiber consumption gradually. Increasing your intake of dietary fiber too quickly may cause bloating, cramping, or gas.  Drink plenty of water. Water helps you to digest fiber. WHAT FOODS CAN I EAT? Grains Whole-grain breads. Multigrain cereal. Oats and oatmeal. Brown rice. Barley. Bulgur wheat. [place]. Bran muffins. Popcorn. Rye wafer crackers. Vegetables Sweet potatoes.  Spinach. Kale. Artichokes. Cabbage. Broccoli. Green peas. Carrots. Squash. Fruits Berries. Pears. Apples. Oranges. Avocados. Prunes and raisins. Dried figs. Meats and Other Protein Sources [place], kidney, pinto, and soy beans. Split peas. Lentils. Nuts and seeds. Dairy Fiber-fortified yogurt. Beverages Fiber-fortified soy milk. Fiber-fortified orange juice. Other Fiber bars. The items listed above may not be a complete list of recommended foods or beverages. Contact your dietitian for more options. WHAT FOODS ARE NOT RECOMMENDED? Grains White bread. Pasta made with refined flour. White rice. Vegetables Fried potatoes. Canned vegetables. Well-cooked vegetables.  Fruits Fruit juice. Cooked, strained fruit. Meats and Other Protein Sources Fatty cuts of meat. Fried Sales executive or fried fish. Dairy Milk. Yogurt. Cream cheese. Sour cream. Beverages Soft drinks. Other Cakes and pastries. Butter and oils. The items listed above may not be a complete list of foods and beverages to avoid. Contact your dietitian for more information. WHAT ARE SOME TIPS FOR INCLUDING HIGH-FIBER FOODS IN MY DIET?  Eat a wide variety of high-fiber foods.  Make sure that half of all grains consumed each day are whole grains.  Replace breads and cereals made from refined flour or white flour with whole-grain breads and cereals.  Replace white rice with brown rice, bulgur wheat, or millet.  Start the day with a breakfast that is high in fiber, such as a cereal that contains at least 5 grams of fiber per serving.  Use beans in place of meat in soups, salads, or pasta.  Eat high-fiber snacks, such as berries, raw vegetables, nuts, or popcorn.   This information is not intended to replace advice given to  you by your health care provider. Make sure you discuss any questions you have with your health care provider.   Document Released: 03/30/2005 Document Revised: 04/20/2014 Document Reviewed: 09/12/2013 Elsevier  Interactive Patient Education 2016 [place] DASH stands for "Dietary Approaches to Stop Hypertension." The DASH eating plan is a healthy eating plan that has been shown to reduce high blood pressure (hypertension). Additional health benefits may include reducing the risk of type 2 diabetes mellitus, heart disease, and stroke. The DASH eating plan may also help with weight loss. WHAT DO I NEED TO KNOW ABOUT THE DASH EATING PLAN? For the DASH eating plan, you will follow these general guidelines:  Choose foods with a percent daily value for sodium of less than 5% (as listed on the food label).  Use salt-free seasonings or herbs instead of table salt or sea salt.  Check with your health care provider or pharmacist before using salt substitutes.  Eat lower-sodium products, often labeled as "lower sodium" or "no salt added."  Eat fresh foods.  Eat more vegetables, fruits, and low-fat dairy products.  Choose whole grains. Look for the word "whole" as the first word in the ingredient list.  Choose fish and skinless chicken or [place] more often than red meat. Limit fish, poultry, and meat to 6 oz (170 g) each day.  Limit sweets, desserts, sugars, and sugary drinks.  Choose heart-healthy fats.  Limit cheese to 1 oz (28 g) per day.  Eat more home-cooked food and less restaurant, buffet, and fast food.  Limit fried foods.  Cook foods using methods other than frying.  Limit canned vegetables. If you do use them, rinse them well to decrease the sodium.  When eating at a restaurant, ask that your food be prepared with less salt, or no salt if possible. WHAT FOODS CAN I EAT? Seek help from a dietitian for individual calorie needs. Grains Whole grain or whole wheat bread. Brown rice. Whole grain or whole wheat pasta. Quinoa, bulgur, and whole grain cereals. Low-sodium cereals. Corn or whole wheat flour tortillas. Whole grain cornbread. Whole grain crackers.  Low-sodium crackers. Vegetables Fresh or frozen vegetables (raw, steamed, roasted, or grilled). Low-sodium or reduced-sodium tomato and vegetable juices. Low-sodium or reduced-sodium tomato sauce and paste. Low-sodium or reduced-sodium canned vegetables.  Fruits All fresh, canned (in natural juice), or frozen fruits. Meat and Other Protein Products Ground beef (85% or leaner), grass-fed beef, or beef trimmed of fat. Skinless chicken or [place]. Ground chicken or [place]. Pork trimmed of fat. All fish and seafood. Eggs. Dried beans, peas, or lentils. Unsalted nuts and seeds. Unsalted canned beans. Dairy Low-fat dairy products, such as skim or 1% milk, 2% or reduced-fat cheeses, low-fat ricotta or cottage cheese, or plain low-fat yogurt. Low-sodium or reduced-sodium cheeses. Fats and Oils Tub margarines without trans fats. Light or reduced-fat mayonnaise and salad dressings (reduced sodium). Avocado. Safflower, olive, or canola oils. Natural peanut or almond butter. Other Unsalted popcorn and pretzels. The items listed above may not be a complete list of recommended foods or beverages. Contact your dietitian for more options. WHAT FOODS ARE NOT RECOMMENDED? Grains White bread. White pasta. White rice. Refined cornbread. Bagels and croissants. Crackers that contain trans fat. Vegetables Creamed or fried vegetables. Vegetables in a cheese sauce. Regular canned vegetables. Regular canned tomato sauce and paste. Regular tomato and vegetable juices. Fruits Dried fruits. Canned fruit in light or heavy syrup. Fruit juice. Meat and Other Protein Products Fatty cuts of  meat. Ribs, chicken wings, bacon, sausage, bologna, salami, chitterlings, fatback, hot dogs, bratwurst, and packaged luncheon meats. Salted nuts and seeds. Canned beans with salt. Dairy Whole or 2% milk, cream, half-and-half, and cream cheese. Whole-fat or sweetened yogurt. Full-fat cheeses or blue cheese. Nondairy creamers and whipped  toppings. Processed cheese, cheese spreads, or cheese curds. Condiments Onion and garlic salt, seasoned salt, table salt, and sea salt. Canned and packaged gravies. Worcestershire sauce. Tartar sauce. Barbecue sauce. Teriyaki sauce. Soy sauce, including reduced sodium. Steak sauce. Fish sauce. Oyster sauce. Cocktail sauce. Horseradish. Ketchup and mustard. Meat flavorings and tenderizers. Bouillon cubes. Hot sauce. Tabasco sauce. Marinades. Taco seasonings. Relishes. Fats and Oils Butter, stick margarine, lard, shortening, ghee, and bacon fat. Coconut, palm kernel, or palm oils. Regular salad dressings. Other Pickles and olives. Salted popcorn and pretzels. The items listed above may not be a complete list of foods and beverages to avoid. Contact your dietitian for more information. WHERE CAN I FIND MORE INFORMATION? National Heart, Lung, and Blood Institute: travelstabloid.com   This information is not intended to replace advice given to you by your health care provider. Make sure you discuss any questions you have with your health care provider.   Document Released: 03/19/2011 Document Revised: 04/20/2014 Document Reviewed: 02/01/2013 Elsevier Interactive Patient Education 2016 [employer].   Hypertension Hypertension, commonly called high blood pressure, is when the force of blood pumping through your arteries is too strong. Your arteries are the blood vessels that carry blood from your heart throughout your body. A blood pressure reading consists of a higher number over a lower number, such as 110/72. The higher number (systolic) is the pressure inside your arteries when your heart pumps. The lower number (diastolic) is the pressure inside your arteries when your heart relaxes. Ideally you want your blood pressure below 120/80. Hypertension forces your heart to work harder to pump blood. Your arteries may become narrow or stiff. Having untreated or  uncontrolled hypertension can cause heart attack, stroke, kidney disease, and other problems. RISK FACTORS Some risk factors for high blood pressure are controllable. Others are not.  Risk factors you cannot control include:   Race. You may be at higher risk if you are African American.  Age. Risk increases with age.  Gender. Men are at higher risk than women before age 68 years. After age 49, women are at higher risk than men. Risk factors you can control include:  Not getting enough exercise or physical activity.  Being overweight.  Getting too much fat, sugar, calories, or salt in your diet.  Drinking too much alcohol. SIGNS AND SYMPTOMS Hypertension does not usually cause signs or symptoms. Extremely high blood pressure (hypertensive crisis) may cause headache, anxiety, shortness of breath, and nosebleed. DIAGNOSIS To check if you have hypertension, your health care provider will measure your blood pressure while you are seated, with your arm held at the level of your heart. It should be measured at least twice using the same arm. Certain conditions can cause a difference in blood pressure between your right and left arms. A blood pressure reading that is higher than normal on one occasion does not mean that you need treatment. If it is not clear whether you have high blood pressure, you may be asked to return on a different day to have your blood pressure checked again. Or, you may be asked to monitor your blood pressure at home for 1 or more weeks. TREATMENT Treating high blood pressure includes making lifestyle changes  and possibly taking medicine. Living a healthy lifestyle can help lower high blood pressure. You may need to change some of your habits. Lifestyle changes may include:  Following the DASH diet. This diet is high in fruits, vegetables, and whole grains. It is low in salt, red meat, and added sugars.  Keep your sodium intake below 2,300 mg per day.  Getting at least  30-45 minutes of aerobic exercise at least 4 times per week.  Losing weight if necessary.  Not smoking.  Limiting alcoholic beverages.  Learning ways to reduce stress. Your health care provider may prescribe medicine if lifestyle changes are not enough to get your blood pressure under control, and if one of the following is true:  You are 37-11 years of age and your systolic blood pressure is above 140.  You are 46 years of age or older, and your systolic blood pressure is above 150.  Your diastolic blood pressure is above 90.  You have diabetes, and your systolic blood pressure is over [id number] or your diastolic blood pressure is over 90.  You have kidney disease and your blood pressure is above 140/90.  You have heart disease and your blood pressure is above 140/90. Your personal target blood pressure may vary depending on your medical conditions, your age, and other factors. HOME CARE INSTRUCTIONS  Have your blood pressure rechecked as directed by your health care provider.   Take medicines only as directed by your health care provider. Follow the directions carefully. Blood pressure medicines must be taken as prescribed. The medicine does not work as well when you skip doses. Skipping doses also puts you at risk for problems.  Do not smoke.   Monitor your blood pressure at home as directed by your health care provider. SEEK MEDICAL CARE IF:   You think you are having a reaction to medicines taken.  You have recurrent headaches or feel dizzy.  You have swelling in your ankles.  You have trouble with your vision. SEEK IMMEDIATE MEDICAL CARE IF:  You develop a severe headache or confusion.  You have unusual weakness, numbness, or feel faint.  You have severe chest or abdominal pain.  You vomit repeatedly.  You have trouble breathing. MAKE SURE YOU:   Understand these instructions.  Will watch your condition.  Will get help right away if you are not doing well  or get worse.   This information is not intended to replace advice given to you by your health care provider. Make sure you discuss any questions you have with your health care provider.   Document Released: 03/30/2005 Document Revised: 08/14/2014 Document Reviewed: 01/20/2013 Elsevier Interactive Patient Education 2016 [employer].  Smoking Cessation, Tips for Success If you are ready to quit smoking, congratulations! You have chosen to help yourself be healthier. Cigarettes bring nicotine, tar, carbon monoxide, and other irritants into your body. Your lungs, heart, and blood vessels will be able to work better without these poisons. There are many different ways to quit smoking. Nicotine gum, nicotine patches, a nicotine inhaler, or nicotine nasal spray can help with physical craving. Hypnosis, support groups, and medicines help break the habit of smoking. WHAT THINGS CAN I DO TO MAKE QUITTING EASIER?  Here are some tips to help you quit for good:  Pick a date when you will quit smoking completely. Tell all of your friends and family about your plan to quit on that date.  Do not try to slowly cut down on the number of  cigarettes you are smoking. Pick a quit date and quit smoking completely starting on that day.  Throw away all cigarettes.   Clean and remove all ashtrays from your home, work, and car.  On a card, write down your reasons for quitting. Carry the card with you and read it when you get the urge to smoke.  Cleanse your body of nicotine. Drink enough water and fluids to keep your urine clear or pale yellow. Do this after quitting to flush the nicotine from your body.  Learn to predict your moods. Do not let a bad situation be your excuse to have a cigarette. Some situations in your life might tempt you into wanting a cigarette.  Never have "just one" cigarette. It leads to wanting another and another. Remind yourself of your decision to quit.  Change habits associated with  smoking. If you smoked while driving or when feeling stressed, try other activities to replace smoking. Stand up when drinking your coffee. Brush your teeth after eating. Sit in a different chair when you read the paper. Avoid alcohol while trying to quit, and try to drink fewer caffeinated beverages. Alcohol and caffeine may urge you to smoke.  Avoid foods and drinks that can trigger a desire to smoke, such as sugary or spicy foods and alcohol.  Ask people who smoke not to smoke around you.  Have something planned to do right after eating or having a cup of coffee. For example, plan to take a walk or exercise.  Try a relaxation exercise to calm you down and decrease your stress. Remember, you may be tense and nervous for the first 2 weeks after you quit, but this will pass.  Find new activities to keep your hands busy. Play with a pen, coin, or rubber band. Doodle or draw things on paper.  Brush your teeth right after eating. This will help cut down on the craving for the taste of tobacco after meals. You can also try mouthwash.   Use oral substitutes in place of cigarettes. Try using lemon drops, carrots, cinnamon sticks, or chewing gum. Keep them handy so they are available when you have the urge to smoke.  When you have the urge to smoke, try deep breathing.  Designate your home as a nonsmoking area.  If you are a heavy smoker, ask your health care provider about a prescription for nicotine chewing gum. It can ease your withdrawal from nicotine.  Reward yourself. Set aside the cigarette money you save and buy yourself something nice.  Look for support from others. Join a support group or smoking cessation program. Ask someone at home or at work to help you with your plan to quit smoking.  Always ask yourself, "Do I need this cigarette or is this just a reflex?" Tell yourself, "Today, I choose not to smoke," or "I do not want to smoke." You are reminding yourself of your decision to  quit.  Do not replace cigarette smoking with electronic cigarettes (commonly called e-cigarettes). The safety of e-cigarettes is unknown, and some may contain harmful chemicals.  If you relapse, do not give up! Plan ahead and think about what you will do the next time you get the urge to smoke. HOW WILL I FEEL WHEN I QUIT SMOKING? You may have symptoms of withdrawal because your body is used to nicotine (the addictive substance in cigarettes). You may crave cigarettes, be irritable, feel very hungry, cough often, get headaches, or have difficulty concentrating. The withdrawal symptoms are  only temporary. They are strongest when you first quit but will go away within 10-14 days. When withdrawal symptoms occur, stay in control. Think about your reasons for quitting. Remind yourself that these are signs that your body is healing and getting used to being without cigarettes. Remember that withdrawal symptoms are easier to treat than the major diseases that smoking can cause.  Even after the withdrawal is over, expect periodic urges to smoke. However, these cravings are generally short lived and will go away whether you smoke or not. Do not smoke! WHAT RESOURCES ARE AVAILABLE TO HELP ME QUIT SMOKING? Your health care provider can direct you to community resources or hospitals for support, which may include:  Group support.  Education.  Hypnosis.  Therapy.   This information is not intended to replace advice given to you by your health care provider. Make sure you discuss any questions you have with your health care provider.   Document Released: 12/27/2003 Document Revised: 04/20/2014 Document Reviewed: 09/15/2012 Elsevier Interactive Patient Education [employer].

## 2015-07-16 NOTE — Progress Notes (Signed)
Vanessa Proctor, is a 60 y.o. female  [id number]  [id number]  DOB - 03/26/1956  CC:  Chief Complaint  Patient presents with  . Follow-up       HPI: Vanessa Proctor is a 60 y.o. female here today to establish medical care.  She has not been seen in our clinic since 8/15.  She has had a very busy winter, has been feeling very "hyperactive" since than. Says shes trying to do 4 things at once, and gets "racing thoughts" at times.  She also notes weight loss of about 10 lbs in the last month.  She thought possibly her thyroid was an issue.  She has been worried about that.    Patient has No headache, No chest pain, No abdominal pain - No Nausea, No new weakness tingling or numbness, No Cough - SOB.  Denies any dizziness, palpitations.  Denies insomnia, but says worries a lot. Denies Si/hi/ah/vh.  Hx of possible depression last winter again where her cardiologist started her on Celexa again, which appears to be helping.   Allergies  Allergen Reactions  . Bactrim Rash  . Sulfa Antibiotics Rash   Past Medical History  Diagnosis Date  . Hypertension   . Depression   . Myocardial infarction [facility])     november 1st 2012  . Coronary artery disease   . Stroke [facility])     november, 2012  . Arthritis     knees  . Nausea & vomiting last month  . Constipation last month  . Rectal bleeding   . Tobacco abuse   . Drug abuse    Current Outpatient Prescriptions on File Prior to Visit  Medication Sig Dispense Refill  . albuterol (PROVENTIL HFA;VENTOLIN HFA) 108 (90 BASE) MCG/ACT inhaler Inhale 2 puffs into the lungs every 6 (six) hours as needed for wheezing or shortness of breath. 1 Inhaler 2  . aspirin 81 MG chewable tablet Chew 81 mg by mouth daily after lunch.     Marland Kitchen atorvastatin (LIPITOR) 80 MG tablet TAKE ONE-HALF TABLET BY MOUTH ONCE DAILY 30 tablet 11  . citalopram (CELEXA) 20 MG tablet Take 20 mg by mouth at bedtime.    . clopidogrel (PLAVIX) 75 MG tablet Take 75 mg by  mouth daily. At lunch    . ibuprofen (ADVIL,MOTRIN) 400 MG tablet Take 1 tablet (400 mg total) by mouth every 6 (six) hours as needed. 30 tablet 0  . losartan (COZAAR) 100 MG tablet TAKE ONE TABLET BY MOUTH ONCE DAILY 30 tablet 11  . metoprolol tartrate (LOPRESSOR) 25 MG tablet TAKE ONE TABLET BY MOUTH TWICE DAILY 60 tablet 11  . nitroGLYCERIN (NITROSTAT) 0.4 MG SL tablet Place 1 tablet (0.4 mg total) under the tongue every 5 (five) minutes as needed for chest pain. 90 tablet 3   No current facility-administered medications on file prior to visit.   Family History  Problem Relation Age of Onset  . Coronary artery disease    . Cancer Mother     mass in abdomin  . Pancreatic cancer Sister   . Stroke Brother   . Heart attack Maternal Grandfather   . Hypertension Maternal Grandfather    Social History   Social History  . Marital Status: Single    Spouse Name: N/A  . Number of Children: N/A  . Years of Education: N/A   Occupational History  . Not on file.   Social History Main Topics  . Smoking status: Current Every Day Smoker -- 0.25 packs/day for  40 years    Types: Cigarettes  . Smokeless tobacco: Never Used     Comment: trying to quit today 02/05/14  . Alcohol Use: No     Comment: quit drugs 2011  marjuana  . Drug Use: Yes    Special: Marijuana     Comment: marijuana  . Sexual Activity: Not on file   Other Topics Concern  . Not on file   Social History Narrative    Review of Systems: Constitutional: Negative for fever, chills, diaphoresis,  appetite change and fatigue.  +weight loss, increase activity, hyperactivity HENT: Negative for ear pain, nosebleeds, congestion, facial swelling, rhinorrhea, neck pain, neck stiffness and ear discharge.  Eyes: Negative for pain, discharge, redness, itching and visual disturbance. Respiratory: Negative for cough, choking, chest tightness, shortness of breath, wheezing and stridor.  Cardiovascular: Negative for chest pain,  palpitations and leg swelling. Gastrointestinal: Negative for abdominal distention.  +hard small balls of stool, sits on toilet sometimes 75mins, denies hematochezia/brbpr Genitourinary: Negative for dysuria, urgency, frequency, hematuria, flank pain, decreased urine volume, difficulty urinating and dyspareunia.    Musculoskeletal: Negative for back pain, joint swelling, arthralgia and gait problem. Neurological: Negative for dizziness, tremors, seizures, syncope, facial asymmetry, speech difficulty, weakness, light-headedness, numbness and headaches.  Hematological: Negative for adenopathy. Does not bruise/bleed easily. Psychiatric/Behavioral: Negative for hallucinations, behavioral problems, confusion, dysphoric mood, decreased concentration and agitation.   +hyperactivity and "racing thoughts"   Objective:   Filed Vitals:   07/16/15 1023  BP: 150/83  Pulse: 83  Temp: 98 F (36.7 C)  Resp: 16    Physical Exam: Constitutional: Patient appears well-developed and well-nourished. No distress. aaox 3,  Speech is normal, not rapid. HENT: Normocephalic, atraumatic, External right and left ear normal. Oropharynx is clear and moist.  Eyes: Conjunctivae and EOM are normal. PERRL, no scleral icterus. Neck: Normal ROM. Neck supple. No JVD. No tracheal deviation. No thyromegaly. CVS: RRR, S1/S2 +, no murmurs, no gallops, no carotid bruit.  No le edeam/c/c Pulmonary: Effort and breath sounds normal, no stridor, rhonchi, wheezes, rales.  Abdominal: Soft. BS +, no distension, tenderness, rebound or guarding.  Musculoskeletal: Normal range of motion. No edema and no tenderness.  Lymphadenopathy: No lymphadenopathy noted, cervical Neuro: Alert. Normal reflexes, muscle tone coordination. No cranial nerve deficit grossly. Skin: Skin is warm and dry. No rash noted. Not diaphoretic. No erythema. No pallor. Psychiatric: Normal mood and affect. Behavior, judgment, thought content normal.  Speech normal.    No signs of mania noted.  Lab Results  Component Value Date   WBC 4.4 06/10/2015   HGB 12.2 06/10/2015   HCT 36.8 06/10/2015   MCV 86.0 06/10/2015   PLT 222 06/10/2015   Lab Results  Component Value Date   CREATININE 0.74 06/10/2015   BUN 10 06/10/2015   NA 137 06/10/2015   K 3.0* 06/10/2015   CL 102 06/10/2015   CO2 25 06/10/2015    Lab Results  Component Value Date   HGBA1C 6.3* 03/04/2015   Lipid Panel     Component Value Date/Time   CHOL 103 03/04/2015 0513   TRIG 48 03/04/2015 0513   HDL 35* 03/04/2015 0513   CHOLHDL 2.9 03/04/2015 0513   VLDL 10 03/04/2015 0513   LDLCALC 58 03/04/2015 0513       Assessment and plan:   1. HYPERTENSION, BENIGN ESSENTIAL - ellavated today, despite her am meds.  She said they have been elavated last 2 wks or so. - trial increasing norvasc, will make  it 5mg  po bid for now - fu w/ Stacey/pharm clinic for bp chk in 2 wks.  2. Coronary artery disease involving native coronary artery of native heart without angina pectoris - per cards - continue bb, cozaar, plavix, asa 81, statin  3. TOBACCO ABUSE Now down to 5-6 cigs a day, encouraged to stop, not quite ready, has some nicotine replacement patches at home, but not using b/c makes her nauseus when smoking at same time  4. Constipation, unspecified constipation type - recd high fiber diet and not straining, hx of hemorrhoids on colonoscopy  5. Weight loss, not planned - ?mania vs hyperactive thyroid, hx of anxiety, may be bipolar, will follow, no overt signs of overt mania - TSH  6. Visit for screening mammogram - MM Digital Screening; Future   Return in about 4 weeks (around 08/13/2015).  The patient was given clear instructions to go to ER or return to medical center if symptoms don't improve, worsen or new problems develop. The patient verbalized understanding. The patient was told to call to get lab results if they haven't heard anything in the next week.      Maren Reamer, MD, [facility], [place]   07/16/2015, 11:01 AM

## 2015-07-22 ENCOUNTER — Telehealth: Payer: Self-pay | Admitting: Cardiology

## 2015-07-22 ENCOUNTER — Telehealth: Payer: Self-pay | Admitting: Internal Medicine

## 2015-07-22 NOTE — Telephone Encounter (Signed)
Patient called and requesting her lab results. Please f/u with pt

## 2015-07-22 NOTE — Telephone Encounter (Signed)
Patients would like lab results. Please follow up.

## 2015-07-22 NOTE — Telephone Encounter (Signed)
Pt advised notes and lab done 07/16/15 is available to Dr Aundra Dubin in [place].

## 2015-07-22 NOTE — Telephone Encounter (Signed)
New message      Calling to see if we have received last ov note and lab results from [facility]?

## 2015-07-24 NOTE — Telephone Encounter (Signed)
Patient called and requesting her blood work results, please f/u with pt.

## 2015-07-25 ENCOUNTER — Ambulatory Visit (HOSPITAL_COMMUNITY): Payer: Self-pay | Admitting: Psychiatry

## 2015-07-25 ENCOUNTER — Telehealth: Payer: Self-pay

## 2015-07-25 NOTE — Telephone Encounter (Signed)
Pt. Called requesting her Lab results, please f/u with pt.

## 2015-07-25 NOTE — Telephone Encounter (Signed)
Notes Recorded by Maren Reamer, MD on 07/17/2015 at 3:27 PM Please call patient and tell her that her thyroid is normal. i suspect her overactivity may be a mild form of mania (1 component of bipolar (other is depression); we discussed this in clinic. We may need to up her celexa, tell her to keep her appt in 1 month and we can look into this more. thanks   CMA called patient, patient verified name and DOB. Patient was given lab results verbalized he understood with no further questions.

## 2015-07-30 ENCOUNTER — Ambulatory Visit: Payer: Medicare Other | Attending: Internal Medicine | Admitting: Pharmacist

## 2015-07-30 ENCOUNTER — Encounter: Payer: Self-pay | Admitting: Pharmacist

## 2015-07-30 VITALS — BP 125/76 | HR 73

## 2015-07-30 DIAGNOSIS — I10 Essential (primary) hypertension: Secondary | ICD-10-CM | POA: Diagnosis not present

## 2015-07-30 DIAGNOSIS — I2510 Atherosclerotic heart disease of native coronary artery without angina pectoris: Secondary | ICD-10-CM | POA: Diagnosis not present

## 2015-07-30 MED ORDER — AMLODIPINE BESYLATE 10 MG PO TABS: 10.0000 mg | ORAL_TABLET | Freq: Every day | ORAL | Status: DC

## 2015-07-30 NOTE — Patient Instructions (Signed)
Thanks for coming to see me!  Your blood pressure looks great!  I ordered the amlodipine 10 mg and you can take 1 tablet once a day to replace the 5 mg 1 tablet 2 times a day  Follow up with Dr. Janne Napoleon for depression medication

## 2015-07-30 NOTE — Progress Notes (Signed)
S:    Patient arrives in good spirits.    Presents to the clinic for hypertension evaluation. Patient was referred on 07/16/15.  Patient was last seen by Primary Care Provider on 07/16/15.   Patient reports adherence with medications. She reports feeling very well overall.  Current BP Medications include:  Amlodipine 5 mg BID, losartan 100 mg daily, and metoprolol 25 mg BID    O:   Last 3 Office BP readings: BP Readings from Last 3 Encounters:  07/30/15 125/76  07/16/15 150/83  06/19/15 140/82    BMET    Component Value Date/Time   NA 137 06/10/2015 1813   K 3.0* 06/10/2015 1813   CL 102 06/10/2015 1813   CO2 25 06/10/2015 1813   GLUCOSE 135* 06/10/2015 1813   BUN 10 06/10/2015 1813   CREATININE 0.74 06/10/2015 1813   CREATININE 0.73 04/01/2015 1300   CALCIUM 9.5 06/10/2015 1813   GFRNONAA >60 06/10/2015 1813   GFRNONAA 83 03/22/2013 1042   GFRAA >60 06/10/2015 1813   GFRAA >89 03/22/2013 1042    A/P: Hypertension longstanding currently controlled on current medications.  Continued losartan 100 mg daily, metoprolol tartrate 25 mg BID, and ordered amlodipine 10 mg daily so patient did not have to use the 5 mg tablets. Encouraged patient to get her refills on time and to not miss any doses of her medications.   Results reviewed and written information provided.   Total time in face-to-face counseling 20 minutes.   F/U Clinic Visit with Dr. Janne Napoleon as directed.

## 2015-07-31 ENCOUNTER — Encounter (HOSPITAL_COMMUNITY): Payer: Self-pay | Admitting: Psychiatry

## 2015-07-31 ENCOUNTER — Ambulatory Visit (INDEPENDENT_AMBULATORY_CARE_PROVIDER_SITE_OTHER): Payer: Medicare Other | Admitting: Psychiatry

## 2015-07-31 VITALS — BP 102/66 | HR 67 | Ht 61.0 in | Wt 153.4 lb

## 2015-07-31 DIAGNOSIS — F3162 Bipolar disorder, current episode mixed, moderate: Secondary | ICD-10-CM

## 2015-07-31 DIAGNOSIS — F3110 Bipolar disorder, current episode manic without psychotic features, unspecified: Secondary | ICD-10-CM

## 2015-07-31 MED ORDER — LAMOTRIGINE 100 MG PO TABS: ORAL_TABLET | ORAL | Status: DC

## 2015-07-31 NOTE — Progress Notes (Signed)
Psychiatric Initial Adult Assessment   Patient Identification: Vanessa Proctor MRN:  [id number] Date of Evaluation:  07/31/2015 Referral Source: Self-referred Chief Complaint: Weight loss  Visit Diagnosis: Bipolar disorder, type 1   ICD-9-CM ICD-10-CM   1. 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 She was asked to be seen after she was psychiatrically hospitalized for a fall. She apparently was evaluated and referred to have a psychiatric assessment. Her family said that she was experiencing an episode where she couldn't stop talking. She apparently was very restless and fell. This it was out of character for her. In a close evaluation the patient acknowledges that about the time she fell release the week she was experiencing euphoria. She said during this episode she did high levels of energy spoke quickly and had an inflated self-esteem. She was not quite grandiose. She also apparently did some risky things that she was not specific about. She also had a decreased need to sleep. She demonstrated a number symptoms consistent with an episode of mania. She claims she's never had such an episode before. Presently the patient is involved with a boyfriend of 4 months. She likes a lot but has no issues with him at this time. The patient has 2 daughters 6 grandchildren and one great-grandchild who she says are all doing well. The patient is a retired [employer] manager. The patient is on disability for coronary artery disease. She also had a right CVA in the past with little residual. The patient denies daily depression. Is not clear she had specific episodes of major depression but it's noted that in 1999 she was suicidal and was admitted to [facility] hospital. At this time the patient denies daily depression and is sleeping well. She does note that her appetite is  reduced and she is losing weight for unclear reasons. Her energy level is low. Patient claims she has a thyroid study that was negative. The patient claims she can concentrate well. She denies being suicidal at this time. She still enjoys reading watching TV sinking and music. The patient acknowledges a distant history of an alcohol problem and now has about 1 drink every other day. Noted also is the patient smokes marijuana weekly. The patient denies ever having auditory visual hallucinations. She denies ever having generalized anxiety disorder panic disorder or obsessive-compulsive disorder. The patient has been on a number past antidepressants including Paxil and Prozac which failed. The patient presently is on Celexa. Today the patient was seen alone. At this time the patient denies new chest pain or shortness of breath. She denies any neurological symptoms.  Associated Signs/Symptoms: Depression Symptoms:  weight loss, (Hypo) Manic Symptoms:   Anxiety Symptoms:   Psychotic Symptoms:   PTSD Symptoms:   Past Psychiatric History: Psychiatric hospitalization at [facility] in 1999.  Previous Psychotropic Medications: Yes   Substance Abuse History in the last 12 months:  Yes.    Consequences of Substance Abuse:   Past Medical History:  Past Medical History  Diagnosis Date  . Hypertension   . Depression   . Myocardial infarction [facility])     november 1st 2012  . Coronary artery disease   . Stroke [facility])     november, 2012  . Arthritis     knees  . Nausea & vomiting last month  . Constipation last month  . Rectal  bleeding   . Tobacco abuse   . Drug abuse     Past Surgical History  Procedure Laterality Date  . Abdominal hysterectomy  1985    partial  . Coronary angioplasty with stent placement  2012    x 1  . Tonsillectomy  1980  . Knot drained and removed from side of face  2007  . Finger fracture surgery Left     reduction with  splint  . Colonoscopy N/A 04/28/2013     Procedure: COLONOSCOPY;  Surgeon: Beryle Beams, MD;  Location: [facility] ENDOSCOPY;  Service: Endoscopy;  Laterality: N/A;    Family Psychiatric History:   Family History:  Family History  Problem Relation Age of Onset  . Coronary artery disease    . Cancer Mother     mass in abdomin  . Pancreatic cancer Sister   . Stroke Brother   . Heart attack Maternal Grandfather   . Hypertension Maternal Grandfather     Social History:   Social History   Social History  . Marital Status: Single    Spouse Name: N/A  . Number of Children: N/A  . Years of Education: N/A   Social History Main Topics  . Smoking status: Current Every Day Smoker -- 0.25 packs/day for 40 years    Types: Cigarettes  . Smokeless tobacco: Never Used     Comment: trying to quit today 02/05/14  . Alcohol Use: No     Comment: quit drugs 2011  marjuana  . Drug Use: Yes    Special: Marijuana     Comment: marijuana  . Sexual Activity: Not Asked   Other Topics Concern  . None   Social History Narrative    Additional Social History:   Allergies:   Allergies  Allergen Reactions  . Bactrim Rash  . Sulfa Antibiotics Rash    Metabolic Disorder Labs: Lab Results  Component Value Date   HGBA1C 6.3* 03/04/2015   MPG 134 03/04/2015   MPG 128* 03/09/2011   No results found for: PROLACTIN Lab Results  Component Value Date   CHOL 103 03/04/2015   TRIG 48 03/04/2015   HDL 35* 03/04/2015   CHOLHDL 2.9 03/04/2015   VLDL 10 03/04/2015   LDLCALC 58 03/04/2015   LDLCALC 55 11/28/2014     Current Medications: Current Outpatient Prescriptions  Medication Sig Dispense Refill  . albuterol (PROVENTIL HFA;VENTOLIN HFA) 108 (90 BASE) MCG/ACT inhaler Inhale 2 puffs into the lungs every 6 (six) hours as needed for wheezing or shortness of breath. 1 Inhaler 2  . amLODipine (NORVASC) 10 MG tablet Take 1 tablet (10 mg total) by mouth daily. 30 tablet 5  . aspirin 81 MG chewable tablet Chew 81 mg by mouth daily after  lunch.     Marland Kitchen atorvastatin (LIPITOR) 80 MG tablet TAKE ONE-HALF TABLET BY MOUTH ONCE DAILY 30 tablet 11  . citalopram (CELEXA) 20 MG tablet Take 20 mg by mouth at bedtime.    . clopidogrel (PLAVIX) 75 MG tablet Take 75 mg by mouth daily. At lunch    . ibuprofen (ADVIL,MOTRIN) 400 MG tablet Take 1 tablet (400 mg total) by mouth every 6 (six) hours as needed. 30 tablet 0  . lamoTRIgine (LAMICTAL) 100 MG tablet Half qhs for 6  Nights then 1  qhs  For 1 week  Then  1 and a half thereafter 60 tablet 2  . losartan (COZAAR) 100 MG tablet TAKE ONE TABLET BY MOUTH ONCE DAILY 30 tablet 11  .  metoprolol tartrate (LOPRESSOR) 25 MG tablet TAKE ONE TABLET BY MOUTH TWICE DAILY 60 tablet 11  . nitroGLYCERIN (NITROSTAT) 0.4 MG SL tablet Place 1 tablet (0.4 mg total) under the tongue every 5 (five) minutes as needed for chest pain. 90 tablet 3   No current facility-administered medications for this visit.    Neurologic: Headache: No Seizure: No Paresthesias:No  Musculoskeletal: Strength & Muscle Tone: within normal limits Gait & Station: normal Patient leans  Psychiatric Specialty Exam: ROS  Blood pressure 102/66, pulse 67, height 5\' 1"  (1.549 m), weight 153 lb 6.4 oz (69.582 kg).Body mass index is 29 kg/(m^2).  General Appearance: Casual  Eye Contact:  Good  Speech:  Clear and Coherent  Volume:  Normal  Mood:  Negative  Affect:  Appropriate  Thought Process:  Coherent  Orientation:  Full (Time, Place, and Person)  Thought Content:  WDL  Suicidal Thoughts:  No  Homicidal Thoughts:  No  Memory:  Negative  Judgement:  Good  Insight:  Good  Psychomotor Activity:  Normal  Concentration:  Good  Recall:  Good  Fund of Knowledge:Good  Language: Fair  Akathisia:  No  Handed:  Right  AIMS (if indicated):    Assets:  Desire for Improvement  ADL's:  Intact  Cognition: WNL  Sleep:     Treatment Plan Summary: At this time I believe the patient describes a fairly good story for having a manic  episode. In retrospect the patient has had probably episodes of major depression 1 where she was suicidal. It makes sense although she is somewhat older that this might be evidence of bipolar disorder. At this time I interventions are for her to discontinue her Celexa. She's been on multiple SSRIs without much effect. The outside chance the SSRI Celexa is causing weight loss is not out of the question. At this time we'll go ahead and begin her on Lamictal beginning at 50 mg and slowly titrating up 150 mg. The patient was given the warning about a rash and rash occur to stop her medicine immediately. This patient she'll be seen again in 5 weeks and she'll bring her family with her. Today she was seen alone. She'll also go ahead and get a TSH. Her next visit we will review her evaluation done at [facility]. At this time the patient is very stable. She's not dangerous to herself or to anyone else. I shared with her that she likely had a manic episode. The possibility of marijuana having effectiveness is not out of the question is perhaps she got marijuana was something added to it. Nonetheless we'll discuss these things directly with her again in 5 weeks with her family. Patient was told to call if there are any problems or if she has a rash. She was told if she has a rash to discontinue the medicine.   Haskel Schroeder, MD 4/19/20174:53 PM

## 2015-08-06 ENCOUNTER — Ambulatory Visit: Payer: Medicare Other | Attending: Internal Medicine | Admitting: Internal Medicine

## 2015-08-06 ENCOUNTER — Encounter: Payer: Self-pay | Admitting: Clinical

## 2015-08-06 ENCOUNTER — Encounter: Payer: Self-pay | Admitting: Internal Medicine

## 2015-08-06 VITALS — BP 122/76 | HR 77 | Temp 98.5°F | Wt 158.6 lb

## 2015-08-06 DIAGNOSIS — I2510 Atherosclerotic heart disease of native coronary artery without angina pectoris: Secondary | ICD-10-CM | POA: Diagnosis not present

## 2015-08-06 DIAGNOSIS — M545 Low back pain: Secondary | ICD-10-CM

## 2015-08-06 DIAGNOSIS — I10 Essential (primary) hypertension: Secondary | ICD-10-CM

## 2015-08-06 DIAGNOSIS — F3110 Bipolar disorder, current episode manic without psychotic features, unspecified: Secondary | ICD-10-CM

## 2015-08-06 DIAGNOSIS — Z720 Tobacco use: Secondary | ICD-10-CM | POA: Diagnosis not present

## 2015-08-06 NOTE — Progress Notes (Signed)
Depression screen [facility] 2/9 08/06/2015 03/22/2013  Decreased Interest 0 0  Down, Depressed, Hopeless 0 0  PHQ - 2 Score 0 0  Altered sleeping 1 -  Tired, decreased energy 1 -  Change in appetite 0 -  Feeling bad or failure about yourself  1 -  Suicidal thoughts 0 -  PHQ-9 Score 3 -  Difficult doing work/chores Not difficult at all -    GAD 7 : Generalized Anxiety Score 08/06/2015  Nervous, Anxious, on Edge 0  Control/stop worrying 0  Worry too much - different things 0  Trouble relaxing 0  Restless 0  Easily annoyed or irritable 1  Afraid - awful might happen 0  Total GAD 7 Score 1  Anxiety Difficulty Not difficult at all

## 2015-08-06 NOTE — Progress Notes (Signed)
Vanessa Proctor, is a 60 y.o. female  [id number]  [id number]  DOB - November 03, 1955  Chief Complaint  Patient presents with  . Follow-up    New medication Lamictal  - going ok        Subjective:   Vanessa Proctor is a 60 y.o. female here today for a follow up visit.  She is doing much better since I last saw her. She saw Dr Casimiro Needle w/ Behavioral health on 07/31/15, and he felt she was suffering from mania, and dx her w/ Bipolar 1 w/ manic episodes.  He subsequently switched her from Celexa to Lamictal.  She is currently titrating up her Lamictal, currently on 100mg  daily, goal is 150mg  daily and will f/u w/ him soon.  Of note, since starting Lamictal, she's noticed that she feels much better, no more racing thought and "hyperactivity".  She also states she is sleeping more and resting more now.  Much calmer.  C/o of some mild left sided low back pain Sunday w/ rain started. Has not tried anything yet.   Patient has No headache, No chest pain, No abdominal pain - No Nausea, No new weakness tingling or numbness, No Cough - SOB.  No problems updated.  ALLERGIES: Allergies  Allergen Reactions  . Bactrim Rash  . Sulfa Antibiotics Rash    PAST MEDICAL HISTORY: Past Medical History  Diagnosis Date  . Hypertension   . Depression   . Myocardial infarction [facility])     november 1st 2012  . Coronary artery disease   . Stroke [facility])     november, 2012  . Arthritis     knees  . Nausea & vomiting last month  . Constipation last month  . Rectal bleeding   . Tobacco abuse   . Drug abuse     MEDICATIONS AT HOME: Prior to Admission medications   Medication Sig Start Date End Date Taking? Authorizing Provider  albuterol (PROVENTIL HFA;VENTOLIN HFA) 108 (90 BASE) MCG/ACT inhaler Inhale 2 puffs into the lungs every 6 (six) hours as needed for wheezing or shortness of breath. 08/13/14  Yes Gregor Hams, MD  amLODipine (NORVASC) 10 MG tablet Take 1 tablet (10 mg total) by mouth  daily. 07/30/15  Yes Tresa Garter, MD  aspirin 81 MG chewable tablet Chew 81 mg by mouth daily after lunch.    Yes Historical Provider, MD  atorvastatin (LIPITOR) 80 MG tablet TAKE ONE-HALF TABLET BY MOUTH ONCE DAILY 07/04/15  Yes Larey Dresser, MD  clopidogrel (PLAVIX) 75 MG tablet Take 75 mg by mouth daily. At lunch   Yes Historical Provider, MD  ibuprofen (ADVIL,MOTRIN) 400 MG tablet Take 1 tablet (400 mg total) by mouth every 6 (six) hours as needed. 06/10/15  Yes Delsa Grana, PA-C  lamoTRIgine (LAMICTAL) 100 MG tablet Half qhs for 6  Nights then 1  qhs  For 1 week  Then  1 and a half thereafter 07/31/15  Yes Norma Fredrickson, MD  losartan (COZAAR) 100 MG tablet TAKE ONE TABLET BY MOUTH ONCE DAILY 04/01/15  Yes Larey Dresser, MD  metoprolol tartrate (LOPRESSOR) 25 MG tablet TAKE ONE TABLET BY MOUTH TWICE DAILY 04/01/15  Yes Larey Dresser, MD  nitroGLYCERIN (NITROSTAT) 0.4 MG SL tablet Place 1 tablet (0.4 mg total) under the tongue every 5 (five) minutes as needed for chest pain. 06/19/15  Yes Burtis Junes, NP  citalopram (CELEXA) 20 MG tablet Take 20 mg by mouth at bedtime. Reported on 08/06/2015  Historical Provider, MD     Objective:   Filed Vitals:   08/06/15 1526  BP: 122/76  Pulse: 77  Temp: 98.5 F (36.9 C)  TempSrc: Oral  Weight: 158 lb 9.6 oz (71.94 kg)    Exam General appearance : Awake, alert, not in any distress. Speech Clear. Not toxic looking,  Pleasant, calm today. HEENT: Atraumatic and Normocephalic Neck: supple, no JVD. Chest:Good air entry bilaterally, no added sounds. CVS: S1 S2 regular, no murmurs/gallups or rubs. Abdomen: Bowel sounds active, Non tender and not distended with no gaurding, rigidity or rebound.  No ttp to back exam. Extremities: B/L Lower Ext shows no edema, both legs are warm to touch Neurology: Awake alert, and oriented X 3, CN II-XII grossly intact, Non focal Skin:No Rash  Data Review Lab Results  Component Value Date   HGBA1C  6.3* 03/04/2015   HGBA1C 6.0 11/30/2013   HGBA1C 6.1* 03/09/2011    Depression screen PHQ 2/9 08/06/2015 03/22/2013  Decreased Interest 0 0  Down, Depressed, Hopeless 0 0  PHQ - 2 Score 0 0  Altered sleeping 1 -  Tired, decreased energy 1 -  Change in appetite 0 -  Feeling bad or failure about yourself  1 -  Suicidal thoughts 0 -  PHQ-9 Score 3 -  Difficult doing work/chores Not difficult at all -      Assessment & Plan   1. Bipolar I disorder, most recent episode (or current) manic (HCC) Much improved on Lamictal, appreciate Dr Plovsky's assistance greatly. F/u w/ Behavioral health.  2. Tobacco abuse - has tried numerous times to stop w/ different medications, etc. All failed. - encouraged tob cessation again, very hard for her. She actually has e-cig at home, so far all studies on E-cig certainly does not show as clear a harm as tob smoking, so I recd that she tries switching to that and than try weaning down on e-cigs as able.    3. HYPERTENSION, BENIGN ESSENTIAL Controlled, again recd low salt diet. - had bit of weight gain recently (153 lbs on 07/31/15)  - likely due to dietary discretion (chinese takeout), again dw pt low salt/dash diet/<2g salt daily  4. Low back pain without sciatica, unspecified back pain laterality - trial back exercises and warm moist heat. - prn motrin 1-2 tabs q8 hrs w/ food if needed on top of it for short course. - she looks pretty comfortable on exam, do not think need more aggressive medication regimen.  5. Health Maintenance - up to date on pap-smear, mm, colonscopy.  Patient have been counseled extensively about nutrition and exercise  Return in about 2 months (around 10/06/2015).  The patient was given clear instructions to go to ER or return to medical center if symptoms don't improve, worsen or new problems develop. The patient verbalized understanding. The patient was told to call to get lab results if they haven't heard anything in  the next week.    Maren Reamer, MD, [facility] [place], [place]   08/06/2015, 4:08 PM

## 2015-08-06 NOTE — Patient Instructions (Addendum)
Low-Sodium Eating Plan Sodium raises blood pressure and causes water to be held in the body. Getting less sodium from food will help lower your blood pressure, reduce any swelling, and protect your heart, liver, and kidneys. We get sodium by adding salt (sodium chloride) to food. Most of our sodium comes from canned, boxed, and frozen foods. Restaurant foods, fast foods, and pizza are also very high in sodium. Even if you take medicine to lower your blood pressure or to reduce fluid in your body, getting less sodium from your food is important. WHAT IS MY PLAN? Most people should limit their sodium intake to 2,300 mg a day. Your health care provider recommends that you limit your sodium intake to __________ a day.  WHAT DO I NEED TO KNOW ABOUT THIS EATING PLAN? For the low-sodium eating plan, you will follow these general guidelines:  Choose foods with a % Daily Value for sodium of less than 5% (as listed on the food label).   Use salt-free seasonings or herbs instead of table salt or sea salt.   Check with your health care provider or pharmacist before using salt substitutes.   Eat fresh foods.  Eat more vegetables and fruits.  Limit canned vegetables. If you do use them, rinse them well to decrease the sodium.   Limit cheese to 1 oz (28 g) per day.   Eat lower-sodium products, often labeled as "lower sodium" or "no salt added."  Avoid foods that contain monosodium glutamate (MSG). MSG is sometimes added to [place] food and some canned foods.  Check food labels (Nutrition Facts labels) on foods to learn how much sodium is in one serving.  Eat more home-cooked food and less restaurant, buffet, and fast food.  When eating at a restaurant, ask that your food be prepared with less salt, or no salt if possible.  HOW DO I READ FOOD LABELS FOR SODIUM INFORMATION? The Nutrition Facts label lists the amount of sodium in one serving of the food. If you eat more than one serving, you  must multiply the listed amount of sodium by the number of servings. Food labels may also identify foods as:  Sodium free--Less than 5 mg in a serving.  Very low sodium--35 mg or less in a serving.  Low sodium--140 mg or less in a serving.  Light in sodium--50% less sodium in a serving. For example, if a food that usually has 300 mg of sodium is changed to become light in sodium, it will have 150 mg of sodium.  Reduced sodium--25% less sodium in a serving. For example, if a food that usually has 400 mg of sodium is changed to reduced sodium, it will have 300 mg of sodium. WHAT FOODS CAN I EAT? Grains Low-sodium cereals, including oats, puffed wheat and rice, and shredded wheat cereals. Low-sodium crackers. Unsalted rice and pasta. Lower-sodium bread.  Vegetables Frozen or fresh vegetables. Low-sodium or reduced-sodium canned vegetables. Low-sodium or reduced-sodium tomato sauce and paste. Low-sodium or reduced-sodium tomato and vegetable juices.  Fruits Fresh, frozen, and canned fruit. Fruit juice.  Meat and Other Protein Products Low-sodium canned tuna and salmon. Fresh or frozen meat, poultry, seafood, and fish. Lamb. Unsalted nuts. Dried beans, peas, and lentils without added salt. Unsalted canned beans. Homemade soups without salt. Eggs.  Dairy Milk. Soy milk. Ricotta cheese. Low-sodium or reduced-sodium cheeses. Yogurt.  Condiments Fresh and dried herbs and spices. Salt-free seasonings. Onion and garlic powders. Low-sodium varieties of mustard and ketchup. Fresh or refrigerated horseradish. [place]  juice.  Fats and Oils Reduced-sodium salad dressings. Unsalted butter.  Other Unsalted popcorn and pretzels.  The items listed above may not be a complete list of recommended foods or beverages. Contact your dietitian for more options. WHAT FOODS ARE NOT RECOMMENDED? Grains Instant hot cereals. Bread stuffing, pancake, and biscuit mixes. Croutons. Seasoned rice or pasta mixes.  Noodle soup cups. Boxed or frozen macaroni and cheese. Self-rising flour. Regular salted crackers. Vegetables Regular canned vegetables. Regular canned tomato sauce and paste. Regular tomato and vegetable juices. Frozen vegetables in sauces. Salted [place] fries. Olives. Angie Fava. Relishes. Sauerkraut. Salsa. Meat and Other Protein Products Salted, canned, smoked, spiced, or pickled meats, seafood, or fish. Bacon, ham, sausage, hot dogs, corned beef, chipped beef, and packaged luncheon meats. Salt pork. Jerky. Pickled herring. Anchovies, regular canned tuna, and sardines. Salted nuts. Dairy Processed cheese and cheese spreads. Cheese curds. Blue cheese and cottage cheese. Buttermilk.  Condiments Onion and garlic salt, seasoned salt, table salt, and sea salt. Canned and packaged gravies. Worcestershire sauce. Tartar sauce. Barbecue sauce. Teriyaki sauce. Soy sauce, including reduced sodium. Steak sauce. Fish sauce. Oyster sauce. Cocktail sauce. Horseradish that you find on the shelf. Regular ketchup and mustard. Meat flavorings and tenderizers. Bouillon cubes. Hot sauce. Tabasco sauce. Marinades. Taco seasonings. Relishes. Fats and Oils Regular salad dressings. Salted butter. Margarine. Ghee. Bacon fat.  Other Potato and tortilla chips. Corn chips and puffs. Salted popcorn and pretzels. Canned or dried soups. Pizza. Frozen entrees and pot pies.  The items listed above may not be a complete list of foods and beverages to avoid. Contact your dietitian for more information.   This information is not intended to replace advice given to you by your health care provider. Make sure you discuss any questions you have with your health care provider.   Document Released: 09/19/2001 Document Revised: 04/20/2014 Document Reviewed: 02/01/2013 Elsevier Interactive Patient Education 2016 [place] DASH stands for "Dietary Approaches to Stop Hypertension." The DASH eating plan is a  healthy eating plan that has been shown to reduce high blood pressure (hypertension). Additional health benefits may include reducing the risk of type 2 diabetes mellitus, heart disease, and stroke. The DASH eating plan may also help with weight loss. WHAT DO I NEED TO KNOW ABOUT THE DASH EATING PLAN? For the DASH eating plan, you will follow these general guidelines:  Choose foods with a percent daily value for sodium of less than 5% (as listed on the food label).  Use salt-free seasonings or herbs instead of table salt or sea salt.  Check with your health care provider or pharmacist before using salt substitutes.  Eat lower-sodium products, often labeled as "lower sodium" or "no salt added."  Eat fresh foods.  Eat more vegetables, fruits, and low-fat dairy products.  Choose whole grains. Look for the word "whole" as the first word in the ingredient list.  Choose fish and skinless chicken or [place] more often than red meat. Limit fish, poultry, and meat to 6 oz (170 g) each day.  Limit sweets, desserts, sugars, and sugary drinks.  Choose heart-healthy fats.  Limit cheese to 1 oz (28 g) per day.  Eat more home-cooked food and less restaurant, buffet, and fast food.  Limit fried foods.  Cook foods using methods other than frying.  Limit canned vegetables. If you do use them, rinse them well to decrease the sodium.  When eating at a restaurant, ask that your food be prepared with less salt,  or no salt if possible. WHAT FOODS CAN I EAT? Seek help from a dietitian for individual calorie needs. Grains Whole grain or whole wheat bread. Brown rice. Whole grain or whole wheat pasta. Quinoa, bulgur, and whole grain cereals. Low-sodium cereals. Corn or whole wheat flour tortillas. Whole grain cornbread. Whole grain crackers. Low-sodium crackers. Vegetables Fresh or frozen vegetables (raw, steamed, roasted, or grilled). Low-sodium or reduced-sodium tomato and vegetable juices. Low-sodium  or reduced-sodium tomato sauce and paste. Low-sodium or reduced-sodium canned vegetables.  Fruits All fresh, canned (in natural juice), or frozen fruits. Meat and Other Protein Products Ground beef (85% or leaner), grass-fed beef, or beef trimmed of fat. Skinless chicken or [place]. Ground chicken or [place]. Pork trimmed of fat. All fish and seafood. Eggs. Dried beans, peas, or lentils. Unsalted nuts and seeds. Unsalted canned beans. Dairy Low-fat dairy products, such as skim or 1% milk, 2% or reduced-fat cheeses, low-fat ricotta or cottage cheese, or plain low-fat yogurt. Low-sodium or reduced-sodium cheeses. Fats and Oils Tub margarines without trans fats. Light or reduced-fat mayonnaise and salad dressings (reduced sodium). Avocado. Safflower, olive, or canola oils. Natural peanut or almond butter. Other Unsalted popcorn and pretzels. The items listed above may not be a complete list of recommended foods or beverages. Contact your dietitian for more options. WHAT FOODS ARE NOT RECOMMENDED? Grains White bread. White pasta. White rice. Refined cornbread. Bagels and croissants. Crackers that contain trans fat. Vegetables Creamed or fried vegetables. Vegetables in a cheese sauce. Regular canned vegetables. Regular canned tomato sauce and paste. Regular tomato and vegetable juices. Fruits Dried fruits. Canned fruit in light or heavy syrup. Fruit juice. Meat and Other Protein Products Fatty cuts of meat. Ribs, chicken wings, bacon, sausage, bologna, salami, chitterlings, fatback, hot dogs, bratwurst, and packaged luncheon meats. Salted nuts and seeds. Canned beans with salt. Dairy Whole or 2% milk, cream, half-and-half, and cream cheese. Whole-fat or sweetened yogurt. Full-fat cheeses or blue cheese. Nondairy creamers and whipped toppings. Processed cheese, cheese spreads, or cheese curds. Condiments Onion and garlic salt, seasoned salt, table salt, and sea salt. Canned and packaged gravies.  Worcestershire sauce. Tartar sauce. Barbecue sauce. Teriyaki sauce. Soy sauce, including reduced sodium. Steak sauce. Fish sauce. Oyster sauce. Cocktail sauce. Horseradish. Ketchup and mustard. Meat flavorings and tenderizers. Bouillon cubes. Hot sauce. Tabasco sauce. Marinades. Taco seasonings. Relishes. Fats and Oils Butter, stick margarine, lard, shortening, ghee, and bacon fat. Coconut, palm kernel, or palm oils. Regular salad dressings. Other Pickles and olives. Salted popcorn and pretzels. The items listed above may not be a complete list of foods and beverages to avoid. Contact your dietitian for more information. WHERE CAN I FIND MORE INFORMATION? National Heart, Lung, and Blood Institute: travelstabloid.com   This information is not intended to replace advice given to you by your health care provider. Make sure you discuss any questions you have with your health care provider.   Document Released: 03/19/2011 Document Revised: 04/20/2014 Document Reviewed: 02/01/2013 Elsevier Interactive Patient Education 2016 Elsevier Inc[employer]  - Smoking Cessation, Tips for Success If you are ready to quit smoking, congratulations! You have chosen to help yourself be healthier. Cigarettes bring nicotine, tar, carbon monoxide, and other irritants into your body. Your lungs, heart, and blood vessels will be able to work better without these poisons. There are many different ways to quit smoking. Nicotine gum, nicotine patches, a nicotine inhaler, or nicotine nasal spray can help with physical craving. Hypnosis, support groups, and medicines help break the habit of smoking.  WHAT THINGS CAN I DO TO MAKE QUITTING EASIER?  Here are some tips to help you quit for good:  Pick a date when you will quit smoking completely. Tell all of your friends and family about your plan to quit on that date.  Do not try to slowly cut down on the number of cigarettes you are smoking. Pick a quit  date and quit smoking completely starting on that day.  Throw away all cigarettes.   Clean and remove all ashtrays from your home, work, and car.  On a card, write down your reasons for quitting. Carry the card with you and read it when you get the urge to smoke.  Cleanse your body of nicotine. Drink enough water and fluids to keep your urine clear or pale yellow. Do this after quitting to flush the nicotine from your body.  Learn to predict your moods. Do not let a bad situation be your excuse to have a cigarette. Some situations in your life might tempt you into wanting a cigarette.  Never have "just one" cigarette. It leads to wanting another and another. Remind yourself of your decision to quit.  Change habits associated with smoking. If you smoked while driving or when feeling stressed, try other activities to replace smoking. Stand up when drinking your coffee. Brush your teeth after eating. Sit in a different chair when you read the paper. Avoid alcohol while trying to quit, and try to drink fewer caffeinated beverages. Alcohol and caffeine may urge you to smoke.  Avoid foods and drinks that can trigger a desire to smoke, such as sugary or spicy foods and alcohol.  Ask people who smoke not to smoke around you.  Have something planned to do right after eating or having a cup of coffee. For example, plan to take a walk or exercise.  Try a relaxation exercise to calm you down and decrease your stress. Remember, you may be tense and nervous for the first 2 weeks after you quit, but this will pass.  Find new activities to keep your hands busy. Play with a pen, coin, or rubber band. Doodle or draw things on paper.  Brush your teeth right after eating. This will help cut down on the craving for the taste of tobacco after meals. You can also try mouthwash.   Use oral substitutes in place of cigarettes. Try using lemon drops, carrots, cinnamon sticks, or chewing gum. Keep them handy so  they are available when you have the urge to smoke.  When you have the urge to smoke, try deep breathing.  Designate your home as a nonsmoking area.  If you are a heavy smoker, ask your health care provider about a prescription for nicotine chewing gum. It can ease your withdrawal from nicotine.  Reward yourself. Set aside the cigarette money you save and buy yourself something nice.  Look for support from others. Join a support group or smoking cessation program. Ask someone at home or at work to help you with your plan to quit smoking.  Always ask yourself, "Do I need this cigarette or is this just a reflex?" Tell yourself, "Today, I choose not to smoke," or "I do not want to smoke." You are reminding yourself of your decision to quit.  Do not replace cigarette smoking with electronic cigarettes (commonly called e-cigarettes). The safety of e-cigarettes is unknown, and some may contain harmful chemicals.  If you relapse, do not give up! Plan ahead and think about what you will  do the next time you get the urge to smoke. HOW WILL I FEEL WHEN I QUIT SMOKING? You may have symptoms of withdrawal because your body is used to nicotine (the addictive substance in cigarettes). You may crave cigarettes, be irritable, feel very hungry, cough often, get headaches, or have difficulty concentrating. The withdrawal symptoms are only temporary. They are strongest when you first quit but will go away within 10-14 days. When withdrawal symptoms occur, stay in control. Think about your reasons for quitting. Remind yourself that these are signs that your body is healing and getting used to being without cigarettes. Remember that withdrawal symptoms are easier to treat than the major diseases that smoking can cause.  Even after the withdrawal is over, expect periodic urges to smoke. However, these cravings are generally short lived and will go away whether you smoke or not. Do not smoke! WHAT RESOURCES ARE  AVAILABLE TO HELP ME QUIT SMOKING? Your health care provider can direct you to community resources or hospitals for support, which may include:  Group support.  Education.  Hypnosis.  Therapy.   This information is not intended to replace advice given to you by your health care provider. Make sure you discuss any questions you have with your health care provider.   Document Released: 12/27/2003 Document Revised: 04/20/2014 Document Reviewed: 09/15/2012 Elsevier Interactive Patient Education [employer].

## 2015-08-07 ENCOUNTER — Other Ambulatory Visit: Payer: Self-pay | Admitting: *Deleted

## 2015-08-07 MED ORDER — METOPROLOL TARTRATE 25 MG PO TABS: 25.0000 mg | ORAL_TABLET | Freq: Two times a day (BID) | ORAL | Status: DC

## 2015-08-07 MED ORDER — ATORVASTATIN CALCIUM 80 MG PO TABS: 40.0000 mg | ORAL_TABLET | Freq: Every day | ORAL | Status: DC

## 2015-08-07 MED ORDER — LOSARTAN POTASSIUM 100 MG PO TABS: 100.0000 mg | ORAL_TABLET | Freq: Every day | ORAL | Status: DC

## 2015-08-07 MED ORDER — CLOPIDOGREL BISULFATE 75 MG PO TABS: 75.0000 mg | ORAL_TABLET | Freq: Every day | ORAL | Status: DC

## 2015-08-07 NOTE — Telephone Encounter (Signed)
Patient verified DOB Patient is aware of thyroid being normal. Patient advised of overactivity may be due to mild mania (bipolar condition). Patient visited with Dr. Janne Napoleon on 08/06/15. No further questions at this time.

## 2015-08-28 DIAGNOSIS — F3162 Bipolar disorder, current episode mixed, moderate: Secondary | ICD-10-CM | POA: Diagnosis not present

## 2015-08-29 LAB — TSH: TSH: 1.06 mIU/L

## 2015-09-04 ENCOUNTER — Ambulatory Visit (INDEPENDENT_AMBULATORY_CARE_PROVIDER_SITE_OTHER): Payer: Medicare Other | Admitting: Psychiatry

## 2015-09-04 VITALS — BP 128/70 | HR 62 | Ht 61.0 in | Wt 156.8 lb

## 2015-09-04 DIAGNOSIS — F3130 Bipolar disorder, current episode depressed, mild or moderate severity, unspecified: Secondary | ICD-10-CM | POA: Diagnosis not present

## 2015-09-04 DIAGNOSIS — I2510 Atherosclerotic heart disease of native coronary artery without angina pectoris: Secondary | ICD-10-CM

## 2015-09-04 MED ORDER — LAMOTRIGINE 200 MG PO TABS: ORAL_TABLET | ORAL | Status: DC

## 2015-09-04 NOTE — Progress Notes (Signed)
Patient ID: Vanessa Proctor, female   DOB: 24-Apr-1955, 60 y.o.   MRN: [id number]  Psychiatric Initial Adult Assessment   Patient Identification: Vanessa Proctor MRN:  [id number] Date of Evaluation:  09/04/2015 Referral Source: Self-referred Chief Complaint: Weight loss  Visit Diagnosis: Bipolar disorder, type 1 No diagnosis found.  History of Present Illness:  Today the patient was seen with her sister-in-law Sunday Spillers. Her sister-in-law confirmed all the descriptions of this patient at having periods where she was manic. She clearly had episodes recently of being euphoric elated talking quickly and being physically hyperactive. These are. For she did need to sleep. She had one significant one in the last year or so but I suspect she's had other hypomanic episodes over the years. It should be noted this patient has tried multiple antidepressants without success. It should be noted that she's had one psychiatric hospitalization where she was depressed and suicidal that was in the 1990s. The patient presently lives with her brother. She doesn't get along well. Sunday Spillers says overall she is improved and stable since being placed on Lamictal. She did have a mild rash on her right shoulder but that is gone away. The patient takes her medicines without problems. Today we spent more than 50% of the time going over the illness of bipolar disorder. She clearly still has some stigma related to his when she was told that she likely should take this medicine indefinitely. Went over the pros and cons and she generally agree that she would take this medicine as prescribed. She is not suicidal. She denies chest pain or shortness of breath or any neurological symptoms at this time. She certainly is not suicidal. Associated Signs/Symptoms: Depression Symptoms:  weight loss, (Hypo) Manic Symptoms:   Anxiety Symptoms:   Psychotic Symptoms:   PTSD Symptoms:   Past Psychiatric History: Psychiatric hospitalization at  [facility] in 1999.  Previous Psychotropic Medications: Yes   Substance Abuse History in the last 12 months:  Yes.    Consequences of Substance Abuse:   Past Medical History:  Past Medical History  Diagnosis Date  . Hypertension   . Depression   . Myocardial infarction [facility])     november 1st 2012  . Coronary artery disease   . Stroke [facility])     november, 2012  . Arthritis     knees  . Nausea & vomiting last month  . Constipation last month  . Rectal bleeding   . Tobacco abuse   . Drug abuse     Past Surgical History  Procedure Laterality Date  . Abdominal hysterectomy  1985    partial  . Coronary angioplasty with stent placement  2012    x 1  . Tonsillectomy  1980  . Knot drained and removed from side of face  2007  . Finger fracture surgery Left     reduction with  splint  . Colonoscopy N/A 04/28/2013    Procedure: COLONOSCOPY;  Surgeon: Beryle Beams, MD;  Location: [facility];  Service: Endoscopy;  Laterality: N/A;    Family Psychiatric History:   Family History:  Family History  Problem Relation Age of Onset  . Coronary artery disease    . Cancer Mother     mass in abdomin  . Pancreatic cancer Sister   . Stroke Brother   . Heart attack Maternal Grandfather   . Hypertension Maternal Grandfather     Social History:   Social History   Social History  . Marital Status:  Single    Spouse Name: N/A  . Number of Children: N/A  . Years of Education: N/A   Social History Main Topics  . Smoking status: Current Every Day Smoker -- 0.25 packs/day for 40 years    Types: Cigarettes  . Smokeless tobacco: Never Used     Comment: trying to quit today 02/05/14  . Alcohol Use: No     Comment: quit drugs 2011  marjuana  . Drug Use: Yes    Special: Marijuana     Comment: marijuana  . Sexual Activity: Not on file   Other Topics Concern  . Not on file   Social History Narrative    Additional Social History:   Allergies:   Allergies  Allergen  Reactions  . Bactrim Rash  . Sulfa Antibiotics Rash    Metabolic Disorder Labs: Lab Results  Component Value Date   HGBA1C 6.3* 03/04/2015   MPG 134 03/04/2015   MPG 128* 03/09/2011   No results found for: PROLACTIN Lab Results  Component Value Date   CHOL 103 03/04/2015   TRIG 48 03/04/2015   HDL 35* 03/04/2015   CHOLHDL 2.9 03/04/2015   VLDL 10 03/04/2015   LDLCALC 58 03/04/2015   LDLCALC 55 11/28/2014     Current Medications: Current Outpatient Prescriptions  Medication Sig Dispense Refill  . albuterol (PROVENTIL HFA;VENTOLIN HFA) 108 (90 BASE) MCG/ACT inhaler Inhale 2 puffs into the lungs every 6 (six) hours as needed for wheezing or shortness of breath. 1 Inhaler 2  . amLODipine (NORVASC) 10 MG tablet Take 1 tablet (10 mg total) by mouth daily. 30 tablet 5  . aspirin 81 MG chewable tablet Chew 81 mg by mouth daily after lunch.     Marland Kitchen atorvastatin (LIPITOR) 80 MG tablet Take 0.5 tablets (40 mg total) by mouth daily. 45 tablet 2  . citalopram (CELEXA) 20 MG tablet Take 20 mg by mouth at bedtime. Reported on 08/06/2015    . clopidogrel (PLAVIX) 75 MG tablet Take 1 tablet (75 mg total) by mouth daily. At lunch 90 tablet 2  . ibuprofen (ADVIL,MOTRIN) 400 MG tablet Take 1 tablet (400 mg total) by mouth every 6 (six) hours as needed. 30 tablet 0  . lamoTRIgine (LAMICTAL) 200 MG tablet Half qhs for 6  Nights then 1  qhs  For 1 week  Then  1 and a half thereafter 30 tablet 6  . losartan (COZAAR) 100 MG tablet Take 1 tablet (100 mg total) by mouth daily. 90 tablet 2  . metoprolol tartrate (LOPRESSOR) 25 MG tablet Take 1 tablet (25 mg total) by mouth 2 (two) times daily. 180 tablet 2  . nitroGLYCERIN (NITROSTAT) 0.4 MG SL tablet Place 1 tablet (0.4 mg total) under the tongue every 5 (five) minutes as needed for chest pain. 90 tablet 3   No current facility-administered medications for this visit.    Neurologic: Headache: No Seizure:  No Paresthesias:No  Musculoskeletal: Strength & Muscle Tone: within normal limits Gait & Station: normal Patient leans  Psychiatric Specialty Exam: ROS  Blood pressure 128/70, pulse 62, height 5\' 1"  (1.549 m), weight 156 lb 12.8 oz (71.124 kg).Body mass index is 29.64 kg/(m^2).  General Appearance: Casual  Eye Contact:  Good  Speech:  Clear and Coherent  Volume:  Normal  Mood:  Negative  Affect:  Appropriate  Thought Process:  Coherent  Orientation:  Full (Time, Place, and Person)  Thought Content:  WDL  Suicidal Thoughts:  No  Homicidal Thoughts:  No  Memory:  Negative  Judgement:  Good  Insight:  Good  Psychomotor Activity:  Normal  Concentration:  Good  Recall:  Good  Fund of Knowledge:Good  Language: Fair  Akathisia:  No  Handed:  Right  AIMS (if indicated):    Assets:  Desire for Improvement  ADL's:  Intact  Cognition: WNL  Sleep:     Treatment Plan Summary: At this time we will go ahead and increase this patient's Lamictal to the full dose of 200 mg. She's not had any true side effects from it. The patient is agreed to take it as prescribed. She is medically stable. She does smoke marijuana once or twice a week but it does not seem to have a much of an effect on her life. The patient is very stable. She denies being suicidal. She'll return to see me in 4 months.  Haskel Schroeder, MD 5/24/20174:52 PM

## 2015-09-11 ENCOUNTER — Ambulatory Visit: Payer: Medicare Other

## 2015-09-13 ENCOUNTER — Ambulatory Visit
Admission: RE | Admit: 2015-09-13 | Discharge: 2015-09-13 | Disposition: A | Discharge status: Home / Self Care | Payer: Medicare Other | Source: Ambulatory Visit | Attending: Internal Medicine | Admitting: Internal Medicine

## 2015-09-13 DIAGNOSIS — Z1231 Encounter for screening mammogram for malignant neoplasm of breast: Secondary | ICD-10-CM

## 2015-10-07 ENCOUNTER — Encounter: Payer: Self-pay | Admitting: Internal Medicine

## 2015-10-07 ENCOUNTER — Ambulatory Visit: Payer: Medicare Other | Attending: Internal Medicine | Admitting: Internal Medicine

## 2015-10-07 VITALS — BP 111/70 | HR 72 | Temp 98.4°F | Resp 18 | Ht 61.0 in | Wt 156.8 lb

## 2015-10-07 DIAGNOSIS — I252 Old myocardial infarction: Secondary | ICD-10-CM | POA: Insufficient documentation

## 2015-10-07 DIAGNOSIS — Z8673 Personal history of transient ischemic attack (TIA), and cerebral infarction without residual deficits: Secondary | ICD-10-CM | POA: Insufficient documentation

## 2015-10-07 DIAGNOSIS — I2510 Atherosclerotic heart disease of native coronary artery without angina pectoris: Secondary | ICD-10-CM | POA: Diagnosis not present

## 2015-10-07 DIAGNOSIS — M13862 Other specified arthritis, left knee: Secondary | ICD-10-CM | POA: Diagnosis not present

## 2015-10-07 DIAGNOSIS — K5900 Constipation, unspecified: Secondary | ICD-10-CM | POA: Insufficient documentation

## 2015-10-07 DIAGNOSIS — Z888 Allergy status to other drugs, medicaments and biological substances status: Secondary | ICD-10-CM | POA: Diagnosis not present

## 2015-10-07 DIAGNOSIS — Z79899 Other long term (current) drug therapy: Secondary | ICD-10-CM | POA: Insufficient documentation

## 2015-10-07 DIAGNOSIS — M13861 Other specified arthritis, right knee: Secondary | ICD-10-CM | POA: Insufficient documentation

## 2015-10-07 DIAGNOSIS — R7303 Prediabetes: Secondary | ICD-10-CM

## 2015-10-07 DIAGNOSIS — Z881 Allergy status to other antibiotic agents status: Secondary | ICD-10-CM | POA: Diagnosis not present

## 2015-10-07 DIAGNOSIS — F319 Bipolar disorder, unspecified: Secondary | ICD-10-CM

## 2015-10-07 DIAGNOSIS — I10 Essential (primary) hypertension: Secondary | ICD-10-CM | POA: Diagnosis not present

## 2015-10-07 DIAGNOSIS — Z7982 Long term (current) use of aspirin: Secondary | ICD-10-CM | POA: Insufficient documentation

## 2015-10-07 LAB — POCT GLYCOSYLATED HEMOGLOBIN (HGB A1C): Hemoglobin A1C: 5.7

## 2015-10-07 MED ORDER — ASPIRIN 81 MG PO CHEW: 81.0000 mg | CHEWABLE_TABLET | Freq: Every day | ORAL | Status: DC

## 2015-10-07 NOTE — Progress Notes (Signed)
Patient is here for FU HTN  Patient complains of right side knot.  Patient has taken medication and patient has eaten today.

## 2015-10-07 NOTE — Progress Notes (Signed)
Vanessa Proctor, is a 60 y.o. female  [id number]  [id number]  DOB - [birth year]  No chief complaint on file.       Subjective:   Vanessa Proctor is a 60 y.o. female here today for a follow up visit for htn.  Doing very well since I last saw her. Bipolar much better controlled on lamictal 200 qday. Taking all her meds as prescribed.  C/o of mild constipation this past week, no n/v/d though.  Hx of prediabetes per labs last year.  Still smoking about 2-3 cig a day, but she found she had no urge to smoke Saturday after she ran out, so may be able to try stopping again.  Patient has No headache, No chest pain, No abdominal pain - No Nausea, No new weakness tingling or numbness, No Cough - SOB.  Problem  Bipolar I Disorder ([facility])    ALLERGIES: Allergies  Allergen Reactions  . Bactrim Rash  . Sulfa Antibiotics Rash    PAST MEDICAL HISTORY: Past Medical History  Diagnosis Date  . Hypertension   . Depression   . Myocardial infarction [facility])     november 1st 2012  . Coronary artery disease   . Stroke [facility])     november, 2012  . Arthritis     knees  . Nausea & vomiting last month  . Constipation last month  . Rectal bleeding   . Tobacco abuse   . Drug abuse     MEDICATIONS AT HOME: Prior to Admission medications   Medication Sig Start Date End Date Taking? Authorizing Provider  albuterol (PROVENTIL HFA;VENTOLIN HFA) 108 (90 BASE) MCG/ACT inhaler Inhale 2 puffs into the lungs every 6 (six) hours as needed for wheezing or shortness of breath. 08/13/14   Gregor Hams, MD  amLODipine (NORVASC) 10 MG tablet Take 1 tablet (10 mg total) by mouth daily. 07/30/15   Tresa Garter, MD  aspirin 81 MG chewable tablet Chew 1 tablet (81 mg total) by mouth daily after lunch. 10/07/15   Maren Reamer, MD  atorvastatin (LIPITOR) 80 MG tablet Take 0.5 tablets (40 mg total) by mouth daily. 08/07/15   Larey Dresser, MD  clopidogrel (PLAVIX) 75 MG tablet Take 1 tablet (75  mg total) by mouth daily. At lunch 08/07/15   Larey Dresser, MD  ibuprofen (ADVIL,MOTRIN) 400 MG tablet Take 1 tablet (400 mg total) by mouth every 6 (six) hours as needed. 06/10/15   Delsa Grana, PA-C  lamoTRIgine (LAMICTAL) 200 MG tablet Half qhs for 6  Nights then 1  qhs  For 1 week  Then  1 and a half thereafter 09/04/15   Norma Fredrickson, MD  losartan (COZAAR) 100 MG tablet Take 1 tablet (100 mg total) by mouth daily. 08/07/15   Larey Dresser, MD  metoprolol tartrate (LOPRESSOR) 25 MG tablet Take 1 tablet (25 mg total) by mouth 2 (two) times daily. 08/07/15   Larey Dresser, MD  nitroGLYCERIN (NITROSTAT) 0.4 MG SL tablet Place 1 tablet (0.4 mg total) under the tongue every 5 (five) minutes as needed for chest pain. 06/19/15   Burtis Junes, NP     Objective:   Filed Vitals:   10/07/15 1145  BP: 111/70  Pulse: 72  Temp: 98.4 F (36.9 C)  TempSrc: Oral  Resp: 18  Height: 5\' 1"  (1.549 m)  Weight: 156 lb 12.8 oz (71.124 kg)  SpO2: 99%     111/70  Exam General appearance : Awake, alert, not  in any distress. Speech Clear. Not toxic looking, looks great. HEENT: Atraumatic and Normocephalic, pupils equally reactive to light. Neck: supple, no JVD. No cervical lymphadenopathy.  Chest:Good air entry bilaterally, no added sounds. CVS: S1 S2 regular, no murmurs/gallups or rubs. Abdomen: Bowel sounds active, Non tender and not distended with no gaurding, rigidity or rebound. Extremities: B/L Lower Ext shows no edema, both legs are warm to touch Neurology: Awake alert, and oriented X 3, CN II-XII grossly intact, Non focal Skin:No Rash  Data Review Lab Results  Component Value Date   HGBA1C 6.3* 03/04/2015   HGBA1C 6.0 11/30/2013   HGBA1C 6.1* 03/09/2011    Depression screen PHQ 2/9 08/06/2015 03/22/2013  Decreased Interest 0 0  Down, Depressed, Hopeless 0 0  PHQ - 2 Score 0 0  Altered sleeping 1 -  Tired, decreased energy 1 -  Change in appetite 0 -  Feeling bad or failure  about yourself  1 -  Suicidal thoughts 0 -  PHQ-9 Score 3 -  Difficult doing work/chores Not difficult at all -      Assessment & Plan   1. Prediabetes - HgB A1c today,  11/16 a1c 6.3  2. HYPERTENSION, BENIGN ESSENTIAL Doing well on current regimen, no changes. Continue f/u w/ cardiology at scheduled appts.  3. Bipolar I disorder ([place]) Doing well on lamictal, appreciate psychiatry assistance.  4. tob abuse - down to 2-3 cigs/day, encouraged cessation.   Patient have been counseled extensively about nutrition and exercise  Return in about 3 months (around 01/07/2016).  The patient was given clear instructions to go to ER or return to medical center if symptoms don't improve, worsen or new problems develop. The patient verbalized understanding. The patient was told to call to get lab results if they haven't heard anything in the next week.   This note has been created with Surveyor, quantity. Any transcriptional errors are unintentional.   Maren Reamer, MD, [facility] [place], [place]   10/07/2015, 12:11 PM

## 2015-10-07 NOTE — Patient Instructions (Addendum)
- try Ground Flax Seed - for constipation, 1 spoonful on salad may be enough!!   High-Fiber Diet Fiber, also called dietary fiber, is a type of carbohydrate found in fruits, vegetables, whole grains, and beans. A high-fiber diet can have many health benefits. Your health care provider may recommend a high-fiber diet to help:  Prevent constipation. Fiber can make your bowel movements more regular.  Lower your cholesterol.  Relieve hemorrhoids, uncomplicated diverticulosis, or irritable bowel syndrome.  Prevent overeating as part of a weight-loss plan.  Prevent heart disease, type 2 diabetes, and certain cancers. WHAT IS MY PLAN? The recommended daily intake of fiber includes:  38 grams for men under age 50.  56 grams for men over age 29.  70 grams for women under age 49.  72 grams for women over age 54. You can get the recommended daily intake of dietary fiber by eating a variety of fruits, vegetables, grains, and beans. Your health care provider may also recommend a fiber supplement if it is not possible to get enough fiber through your diet. WHAT DO I NEED TO KNOW ABOUT A HIGH-FIBER DIET?  Fiber supplements have not been widely studied for their effectiveness, so it is better to get fiber through food sources.  Always check the fiber content on thenutrition facts label of any prepackaged food. Look for foods that contain at least 5 grams of fiber per serving.  Ask your dietitian if you have questions about specific foods that are related to your condition, especially if those foods are not listed in the following section.  Increase your daily fiber consumption gradually. Increasing your intake of dietary fiber too quickly may cause bloating, cramping, or gas.  Drink plenty of water. Water helps you to digest fiber. WHAT FOODS CAN I EAT? Grains Whole-grain breads. Multigrain cereal. Oats and oatmeal. Brown rice. Barley. Bulgur wheat. Rondo. Bran muffins. Popcorn. Rye wafer  crackers. Vegetables Sweet potatoes. Spinach. Kale. Artichokes. Cabbage. Broccoli. Green peas. Carrots. Squash. Fruits Berries. Pears. Apples. Oranges. Avocados. Prunes and raisins. Dried figs. Meats and Other Protein Sources Navy, kidney, pinto, and soy beans. Split peas. Lentils. Nuts and seeds. Dairy Fiber-fortified yogurt. Beverages Fiber-fortified soy milk. Fiber-fortified orange juice. Other Fiber bars. The items listed above may not be a complete list of recommended foods or beverages. Contact your dietitian for more options. WHAT FOODS ARE NOT RECOMMENDED? Grains White bread. Pasta made with refined flour. White rice. Vegetables Fried potatoes. Canned vegetables. Well-cooked vegetables.  Fruits Fruit juice. Cooked, strained fruit. Meats and Other Protein Sources Fatty cuts of meat. Fried Sales executive or fried fish. Dairy Milk. Yogurt. Cream cheese. Sour cream. Beverages Soft drinks. Other Cakes and pastries. Butter and oils. The items listed above may not be a complete list of foods and beverages to avoid. Contact your dietitian for more information. WHAT ARE SOME TIPS FOR INCLUDING HIGH-FIBER FOODS IN MY DIET?  Eat a wide variety of high-fiber foods.  Make sure that half of all grains consumed each day are whole grains.  Replace breads and cereals made from refined flour or white flour with whole-grain breads and cereals.  Replace white rice with brown rice, bulgur wheat, or millet.  Start the day with a breakfast that is high in fiber, such as a cereal that contains at least 5 grams of fiber per serving.  Use beans in place of meat in soups, salads, or pasta.  Eat high-fiber snacks, such as berries, raw vegetables, nuts, or popcorn.   This information is  not intended to replace advice given to you by your health care provider. Make sure you discuss any questions you have with your health care provider.   Document Released: 03/30/2005 Document Revised: 04/20/2014  Document Reviewed: 09/12/2013 Elsevier Interactive Patient Education 2016 [place] Can Quit Smoking If you are ready to quit smoking or are thinking about it, congratulations! You have chosen to help yourself be healthier and live longer! There are lots of different ways to quit smoking. Nicotine gum, nicotine patches, a nicotine inhaler, or nicotine nasal spray can help with physical craving. Hypnosis, support groups, and medicines help break the habit of smoking. TIPS TO GET OFF AND STAY OFF CIGARETTES  Learn to predict your moods. Do not let a bad situation be your excuse to have a cigarette. Some situations in your life might tempt you to have a cigarette.  Ask friends and co-workers not to smoke around you.  Make your home smoke-free.  Never have "just one" cigarette. It leads to wanting another and another. Remind yourself of your decision to quit.  On a card, make a list of your reasons for not smoking. Read it at least the same number of times a day as you have a cigarette. Tell yourself everyday, "I do not want to smoke. I choose not to smoke."  Ask someone at home or work to help you with your plan to quit smoking.  Have something planned after you eat or have a cup of coffee. Take a walk or get other exercise to perk you up. This will help to keep you from overeating.  Try a relaxation exercise to calm you down and decrease your stress. Remember, you may be tense and nervous the first two weeks after you quit. This will pass.  Find new activities to keep your hands busy. Play with a pen, coin, or rubber band. Doodle or draw things on paper.  Brush your teeth right after eating. This will help cut down the craving for the taste of tobacco after meals. You can try mouthwash too.  Try gum, breath mints, or diet candy to keep something in your mouth. IF YOU SMOKE AND WANT TO QUIT:  Do not stock up on cigarettes. Never buy a carton. Wait until one pack is finished before  you buy another.  Never carry cigarettes with you at work or at home.  Keep cigarettes as far away from you as possible. Leave them with someone else.  Never carry matches or a lighter with you.  Ask yourself, "Do I need this cigarette or is this just a reflex?"  Bet with someone that you can quit. Put cigarette money in a piggy bank every morning. If you smoke, you give up the money. If you do not smoke, by the end of the week, you keep the money.  Keep trying. It takes 21 days to change a habit!  Talk to your doctor about using medicines to help you quit. These include nicotine replacement gum, lozenges, or skin patches.   This information is not intended to replace advice given to you by your health care provider. Make sure you discuss any questions you have with your health care provider.   Document Released: 01/24/2009 Document Revised: 06/22/2011 Document Reviewed: 01/24/2009 Elsevier Interactive Patient Education [employer].

## 2015-11-05 ENCOUNTER — Ambulatory Visit (HOSPITAL_COMMUNITY)
Admission: EM | Admit: 2015-11-05 | Discharge: 2015-11-05 | Disposition: A | Discharge status: Home / Self Care | Payer: Medicare Other | Attending: Family Medicine | Admitting: Family Medicine

## 2015-11-05 ENCOUNTER — Encounter (HOSPITAL_COMMUNITY): Payer: Self-pay | Admitting: Emergency Medicine

## 2015-11-05 DIAGNOSIS — H6121 Impacted cerumen, right ear: Secondary | ICD-10-CM | POA: Diagnosis not present

## 2015-11-05 MED ORDER — NEOMYCIN-POLYMYXIN-HC 3.5-10000-1 OT SUSP: 4.0000 [drp] | Freq: Three times a day (TID) | OTIC | 0 refills | Status: DC
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
## 2015-11-05 NOTE — ED Triage Notes (Signed)
PT has a raw area in the inner portion of both ears. PT said areas are tender. PT does report she scratches at them when they scab over.

## 2015-12-02 ENCOUNTER — Telehealth (HOSPITAL_COMMUNITY): Payer: Self-pay

## 2015-12-02 NOTE — Telephone Encounter (Signed)
Patient called and stated that she developed a rash on her hands and wakes up itchy all the time. I advised patient to stop her Lamictal now and that I would talk to you and call her back. Please review and advise, thank you

## 2015-12-19 DIAGNOSIS — H2511 Age-related nuclear cataract, right eye: Secondary | ICD-10-CM | POA: Diagnosis not present

## 2015-12-19 DIAGNOSIS — H40013 Open angle with borderline findings, low risk, bilateral: Secondary | ICD-10-CM | POA: Diagnosis not present

## 2015-12-19 DIAGNOSIS — H25811 Combined forms of age-related cataract, right eye: Secondary | ICD-10-CM | POA: Diagnosis not present

## 2015-12-19 DIAGNOSIS — H25812 Combined forms of age-related cataract, left eye: Secondary | ICD-10-CM | POA: Diagnosis not present

## 2015-12-19 DIAGNOSIS — H04123 Dry eye syndrome of bilateral lacrimal glands: Secondary | ICD-10-CM | POA: Diagnosis not present

## 2015-12-27 ENCOUNTER — Other Ambulatory Visit (HOSPITAL_COMMUNITY): Payer: Self-pay

## 2015-12-27 DIAGNOSIS — H04123 Dry eye syndrome of bilateral lacrimal glands: Secondary | ICD-10-CM | POA: Diagnosis not present

## 2015-12-27 DIAGNOSIS — H25811 Combined forms of age-related cataract, right eye: Secondary | ICD-10-CM | POA: Diagnosis not present

## 2015-12-27 DIAGNOSIS — H40013 Open angle with borderline findings, low risk, bilateral: Secondary | ICD-10-CM | POA: Diagnosis not present

## 2015-12-27 DIAGNOSIS — H25812 Combined forms of age-related cataract, left eye: Secondary | ICD-10-CM | POA: Diagnosis not present

## 2015-12-27 MED ORDER — CARBAMAZEPINE ER 200 MG PO TB12: 200.0000 mg | ORAL_TABLET | Freq: Two times a day (BID) | ORAL | 2 refills | Status: DC

## 2016-01-08 ENCOUNTER — Ambulatory Visit (INDEPENDENT_AMBULATORY_CARE_PROVIDER_SITE_OTHER): Payer: Medicare Other | Admitting: Psychiatry

## 2016-01-08 ENCOUNTER — Encounter (HOSPITAL_COMMUNITY): Payer: Self-pay | Admitting: Psychiatry

## 2016-01-08 VITALS — BP 124/76 | HR 67 | Ht 61.0 in | Wt 158.6 lb

## 2016-01-08 DIAGNOSIS — F3110 Bipolar disorder, current episode manic without psychotic features, unspecified: Secondary | ICD-10-CM

## 2016-01-08 DIAGNOSIS — I2510 Atherosclerotic heart disease of native coronary artery without angina pectoris: Secondary | ICD-10-CM | POA: Diagnosis not present

## 2016-01-08 MED ORDER — DIVALPROEX SODIUM ER 250 MG PO TB24: ORAL_TABLET | ORAL | 5 refills | Status: DC

## 2016-01-08 NOTE — Progress Notes (Signed)
Patient ID: Vanessa Proctor, female   DOB: [birth year], 60 y.o.   MRN: [id number]  Psychiatric Initial Adult Assessment   Patient Identification: Vanessa Proctor MRN:  [id number] Date of Evaluation:  01/08/2016 Referral Source: Self-referred Chief Complaint: Weight loss  Visit Diagnosis: Bipolar disorder, type 1 No diagnosis found.  History of Present Illness:    Since her last visit the patient called describing a rash. His carbamazepine Lamictal caused rashes she's been on it for well over a month and a half. We did increase the dose last time and after the increase she says she started getting a rash and discoloration. Over the phone we stopped the Lamictal and began her on Tegretol. She's been on Tegretol for 1 week but she says every night she takes it she gets hives. She doesn't have the hives in the morning when she takes her first dose but she always gets hives at night when she takes her second dose. I think it's hard to believe that this is related to Tegretol but for now is the patient appears distressed we'll go ahead and discontinue the Tegretol and ask her to wait a week. Shared with her her hives reappear in a week from now for her to see her dermatologist. I think it is here a week later unlikely to be the Tegretol and not even sure the initial rash really was from Lamictal. Nonetheless at this time we'll go ahead and start her on a new anti-manic drug that of Depakote 250 mg ER one in the morning for a week and then increase it to. Just started a week after she stopped the Tegretol. She'll return to see me in approximately 6 weeks. The patient is not showing any signs of mania. She sleeps fairly well but she says the hives Throughout. Her appetite is good. She describes significant stress related to her daughter and living environment. She doesn't really want her daughter to live with her. The patient denies any psychotic symptoms. She denies any alcohol or drugs. She seems to be  reliable. Depression Symptoms:  weight loss, (Hypo) Manic Symptoms:   Anxiety Symptoms:   Psychotic Symptoms:   PTSD Symptoms:   Past Psychiatric History: Psychiatric hospitalization at [facility] regional in 1999.  Previous Psychotropic Medications: Yes   Substance Abuse History in the last 12 months:  Yes.    Consequences of Substance Abuse:   Past Medical History:  Past Medical History:  Diagnosis Date  . Arthritis    knees  . Constipation last month  . Coronary artery disease   . Depression   . Drug abuse   . Hypertension   . Myocardial infarction [facility])    november 1st 2012  . Nausea & vomiting last month  . Rectal bleeding   . Stroke [facility])    november, 2012  . Tobacco abuse     Past Surgical History:  Procedure Laterality Date  . ABDOMINAL HYSTERECTOMY  1985   partial  . COLONOSCOPY N/A 04/28/2013   Procedure: COLONOSCOPY;  Surgeon: Beryle Beams, MD;  [facility]: [facility];  Service: Endoscopy;  Laterality: N/A;  . CORONARY ANGIOPLASTY WITH STENT PLACEMENT  2012   x 1  . FINGER FRACTURE SURGERY Left    reduction with  splint  . knot drained and removed from side of face  2007  . TONSILLECTOMY  1980    Family Psychiatric History:   Family History:  Family History  Problem Relation Age of Onset  . Cancer Mother  mass in abdomin  . Coronary artery disease    . Pancreatic cancer Sister   . Stroke Brother   . Heart attack Maternal Grandfather   . Hypertension Maternal Grandfather     Social History:   Social History   Social History  . Marital status: Single    Spouse name: N/A  . Number of children: N/A  . Years of education: N/A   Social History Main Topics  . Smoking status: Current Every Day Smoker    Packs/day: 0.25    Years: 40.00    Types: Cigarettes  . Smokeless tobacco: Never Used     Comment: trying to quit today 02/05/14  . Alcohol use No     Comment: quit drugs 2011  marjuana  . Drug use: No     Comment: marijuana  .  Sexual activity: Not Currently   Other Topics Concern  . None   Social History Narrative  . None    Additional Social History:   Allergies:   Allergies  Allergen Reactions  . Bactrim Rash  . Sulfa Antibiotics Rash    Metabolic Disorder Labs: Lab Results  Component Value Date   HGBA1C 5.7 10/07/2015   MPG 134 03/04/2015   MPG 128 (H) 03/09/2011   No results found for: PROLACTIN Lab Results  Component Value Date   CHOL 103 03/04/2015   TRIG 48 03/04/2015   HDL 35 (L) 03/04/2015   CHOLHDL 2.9 03/04/2015   VLDL 10 03/04/2015   LDLCALC 58 03/04/2015   LDLCALC 55 11/28/2014     Current Medications: Current Outpatient Prescriptions  Medication Sig Dispense Refill  . albuterol (PROVENTIL HFA;VENTOLIN HFA) 108 (90 BASE) MCG/ACT inhaler Inhale 2 puffs into the lungs every 6 (six) hours as needed for wheezing or shortness of breath. 1 Inhaler 2  . amLODipine (NORVASC) 10 MG tablet Take 1 tablet (10 mg total) by mouth daily. 30 tablet 5  . aspirin 81 MG chewable tablet Chew 1 tablet (81 mg total) by mouth daily after lunch. 90 tablet 3  . atorvastatin (LIPITOR) 80 MG tablet Take 0.5 tablets (40 mg total) by mouth daily. 45 tablet 2  . carbamazepine (TEGRETOL-XR) 200 MG 12 hr tablet Take 1 tablet (200 mg total) by mouth 2 (two) times daily. 60 tablet 2  . clopidogrel (PLAVIX) 75 MG tablet Take 1 tablet (75 mg total) by mouth daily. At lunch 90 tablet 2  . divalproex (DEPAKOTE ER) 250 MG 24 hr tablet 1  qam  For 1 week then   2  qam 60 tablet 5  . ibuprofen (ADVIL,MOTRIN) 400 MG tablet Take 1 tablet (400 mg total) by mouth every 6 (six) hours as needed. 30 tablet 0  . lamoTRIgine (LAMICTAL) 200 MG tablet Half qhs for 6  Nights then 1  qhs  For 1 week  Then  1 and a half thereafter 30 tablet 6  . losartan (COZAAR) 100 MG tablet Take 1 tablet (100 mg total) by mouth daily. 90 tablet 2  . metoprolol tartrate (LOPRESSOR) 25 MG tablet Take 1 tablet (25 mg total) by mouth 2 (two) times  daily. 180 tablet 2  . neomycin-polymyxin-hydrocortisone (CORTISPORIN) 3.5-10000-1 otic suspension Place 4 drops into the right ear 3 (three) times daily. 10 mL 0  . nitroGLYCERIN (NITROSTAT) 0.4 MG SL tablet Place 1 tablet (0.4 mg total) under the tongue every 5 (five) minutes as needed for chest pain. 90 tablet 3   No current facility-administered medications for this visit.  Neurologic: Headache: No Seizure: No Paresthesias:No  Musculoskeletal: Strength & Muscle Tone: within normal limits Gait & Station: normal Patient leans  Psychiatric Specialty Exam: ROS  Blood pressure 124/76, pulse 67, height 5\' 1"  (1.549 m), weight 158 lb 9.6 oz (71.9 kg).Body mass index is 29.97 kg/m.  General Appearance: Casual  Eye Contact:  Good  Speech:  Clear and Coherent  Volume:  Normal  Mood:  Negative  Affect:  Appropriate  Thought Process:  Coherent  Orientation:  Full (Time, Place, and Person)  Thought Content:  WDL  Suicidal Thoughts:  No  Homicidal Thoughts:  No  Memory:  Negative  Judgement:  Good  Insight:  Good  Psychomotor Activity:  Normal  Concentration:  Good  Recall:  Good  Fund of Knowledge:Good  Language: Fair  Akathisia:  No  Handed:  Right  AIMS (if indicated):    Assets:  Desire for Improvement  ADL's:  Intact  Cognition: WNL  Sleep:     Treatment Plan Summary: At this time we will discontinue her Tegretol as her weight one week and to see the hives go away. If they don't go away she's been instructed to contact her dermatologist. She has a she thinks the hives could be from stress. I suspect it was either drug rash or due to stress occurred night. Not sure what this rash is from. At this time we'll go ahead and change her to Depakote 250 mg ER 2 a day. The patient is not suicidal. No every other way she seems stable. She'll return to see me in 7 weeks.  Haskel Schroeder, MD 9/27/20171:37 PM

## 2016-01-16 ENCOUNTER — Ambulatory Visit: Payer: Self-pay | Admitting: Internal Medicine

## 2016-02-04 ENCOUNTER — Encounter: Payer: Self-pay | Admitting: Internal Medicine

## 2016-02-04 ENCOUNTER — Ambulatory Visit: Payer: Medicare Other | Attending: Internal Medicine | Admitting: Internal Medicine

## 2016-02-04 VITALS — BP 112/74 | HR 69 | Temp 98.9°F | Resp 16 | Wt 162.8 lb

## 2016-02-04 DIAGNOSIS — F319 Bipolar disorder, unspecified: Secondary | ICD-10-CM

## 2016-02-04 DIAGNOSIS — E785 Hyperlipidemia, unspecified: Secondary | ICD-10-CM | POA: Diagnosis not present

## 2016-02-04 DIAGNOSIS — H6121 Impacted cerumen, right ear: Secondary | ICD-10-CM

## 2016-02-04 DIAGNOSIS — I10 Essential (primary) hypertension: Secondary | ICD-10-CM | POA: Diagnosis not present

## 2016-02-04 DIAGNOSIS — Z1159 Encounter for screening for other viral diseases: Secondary | ICD-10-CM | POA: Diagnosis not present

## 2016-02-04 DIAGNOSIS — B1920 Unspecified viral hepatitis C without hepatic coma: Secondary | ICD-10-CM | POA: Diagnosis not present

## 2016-02-04 DIAGNOSIS — E2839 Other primary ovarian failure: Secondary | ICD-10-CM

## 2016-02-04 DIAGNOSIS — I2510 Atherosclerotic heart disease of native coronary artery without angina pectoris: Secondary | ICD-10-CM

## 2016-02-04 LAB — LIPID PANEL
Cholesterol: 130 mg/dL (ref 125–200)
HDL: 45 mg/dL — ABNORMAL LOW (ref >=46)
LDL Cholesterol: 49 mg/dL (ref <130)
Total CHOL/HDL Ratio: 2.9 Ratio (ref <=5.0)
Triglycerides: 178 mg/dL — ABNORMAL HIGH (ref <150)
VLDL: 36 mg/dL — ABNORMAL HIGH (ref <30)

## 2016-02-04 MED ORDER — LOSARTAN POTASSIUM 100 MG PO TABS: 100.0000 mg | ORAL_TABLET | Freq: Every day | ORAL | 2 refills | Status: DC

## 2016-02-04 MED ORDER — METOPROLOL TARTRATE 25 MG PO TABS: 25.0000 mg | ORAL_TABLET | Freq: Two times a day (BID) | ORAL | 2 refills | Status: DC

## 2016-02-04 MED ORDER — CLOPIDOGREL BISULFATE 75 MG PO TABS: 75.0000 mg | ORAL_TABLET | Freq: Every day | ORAL | 2 refills | Status: DC

## 2016-02-04 MED ORDER — AMLODIPINE BESYLATE 10 MG PO TABS: 10.0000 mg | ORAL_TABLET | Freq: Every day | ORAL | 3 refills | Status: DC

## 2016-02-04 MED ORDER — CARBAMIDE PEROXIDE 6.5 % OT SOLN: 5.0000 [drp] | Freq: Two times a day (BID) | OTIC | 0 refills | Status: DC

## 2016-02-04 MED ORDER — ATORVASTATIN CALCIUM 80 MG PO TABS: 40.0000 mg | ORAL_TABLET | Freq: Every day | ORAL | 2 refills | Status: DC

## 2016-02-04 MED ORDER — ASPIRIN 81 MG PO CHEW: 81.0000 mg | CHEWABLE_TABLET | Freq: Every day | ORAL | 3 refills | Status: DC

## 2016-02-04 NOTE — Patient Instructions (Signed)
You Can Quit Smoking If you are ready to quit smoking or are thinking about it, congratulations! You have chosen to help yourself be healthier and live longer! There are lots of different ways to quit smoking. Nicotine gum, nicotine patches, a nicotine inhaler, or nicotine nasal spray can help with physical craving. Hypnosis, support groups, and medicines help break the habit of smoking. TIPS TO GET OFF AND STAY OFF CIGARETTES  Learn to predict your moods. Do not let a bad situation be your excuse to have a cigarette. Some situations in your life might tempt you to have a cigarette.  Ask friends and co-workers not to smoke around you.  Make your home smoke-free.  Never have "just one" cigarette. It leads to wanting another and another. Remind yourself of your decision to quit.  On a card, make a list of your reasons for not smoking. Read it at least the same number of times a day as you have a cigarette. Tell yourself everyday, "I do not want to smoke. I choose not to smoke."  Ask someone at home or work to help you with your plan to quit smoking.  Have something planned after you eat or have a cup of coffee. Take a walk or get other exercise to perk you up. This will help to keep you from overeating.  Try a relaxation exercise to calm you down and decrease your stress. Remember, you may be tense and nervous the first two weeks after you quit. This will pass.  Find new activities to keep your hands busy. Play with a pen, coin, or rubber band. Doodle or draw things on paper.  Brush your teeth right after eating. This will help cut down the craving for the taste of tobacco after meals. You can try mouthwash too.  Try gum, breath mints, or diet candy to keep something in your mouth. IF YOU SMOKE AND WANT TO QUIT:  Do not stock up on cigarettes. Never buy a carton. Wait until one pack is finished before you buy another.  Never carry cigarettes with you at work or at home.  Keep cigarettes  as far away from you as possible. Leave them with someone else.  Never carry matches or a lighter with you.  Ask yourself, "Do I need this cigarette or is this just a reflex?"  Bet with someone that you can quit. Put cigarette money in a piggy bank every morning. If you smoke, you give up the money. If you do not smoke, by the end of the week, you keep the money.  Keep trying. It takes 21 days to change a habit!  Talk to your doctor about using medicines to help you quit. These include nicotine replacement gum, lozenges, or skin patches.   This information is not intended to replace advice given to you by your health care provider. Make sure you discuss any questions you have with your health care provider.   Document Released: 01/24/2009 Document Revised: 06/22/2011 Document Reviewed: 01/24/2009 Elsevier Interactive Patient Education 2016 [place] DASH stands for "Dietary Approaches to Stop Hypertension." The DASH eating plan is a healthy eating plan that has been shown to reduce high blood pressure (hypertension). Additional health benefits may include reducing the risk of type 2 diabetes mellitus, heart disease, and stroke. The DASH eating plan may also help with weight loss. WHAT DO I NEED TO KNOW ABOUT THE DASH EATING PLAN? For the DASH eating plan, you will follow these  general guidelines:  Choose foods with a percent daily value for sodium of less than 5% (as listed on the food label).  Use salt-free seasonings or herbs instead of table salt or sea salt.  Check with your health care provider or pharmacist before using salt substitutes.  Eat lower-sodium products, often labeled as "lower sodium" or "no salt added."  Eat fresh foods.  Eat more vegetables, fruits, and low-fat dairy products.  Choose whole grains. Look for the word "whole" as the first word in the ingredient list.  Choose fish and skinless chicken or [place] more often than red meat.  Limit fish, poultry, and meat to 6 oz (170 g) each day.  Limit sweets, desserts, sugars, and sugary drinks.  Choose heart-healthy fats.  Limit cheese to 1 oz (28 g) per day.  Eat more home-cooked food and less restaurant, buffet, and fast food.  Limit fried foods.  Cook foods using methods other than frying.  Limit canned vegetables. If you do use them, rinse them well to decrease the sodium.  When eating at a restaurant, ask that your food be prepared with less salt, or no salt if possible. WHAT FOODS CAN I EAT? Seek help from a dietitian for individual calorie needs. Grains Whole grain or whole wheat bread. Brown rice. Whole grain or whole wheat pasta. Quinoa, bulgur, and whole grain cereals. Low-sodium cereals. Corn or whole wheat flour tortillas. Whole grain cornbread. Whole grain crackers. Low-sodium crackers. Vegetables Fresh or frozen vegetables (raw, steamed, roasted, or grilled). Low-sodium or reduced-sodium tomato and vegetable juices. Low-sodium or reduced-sodium tomato sauce and paste. Low-sodium or reduced-sodium canned vegetables.  Fruits All fresh, canned (in natural juice), or frozen fruits. Meat and Other Protein Products Ground beef (85% or leaner), grass-fed beef, or beef trimmed of fat. Skinless chicken or [place]. Ground chicken or [place]. Pork trimmed of fat. All fish and seafood. Eggs. Dried beans, peas, or lentils. Unsalted nuts and seeds. Unsalted canned beans. Dairy Low-fat dairy products, such as skim or 1% milk, 2% or reduced-fat cheeses, low-fat ricotta or cottage cheese, or plain low-fat yogurt. Low-sodium or reduced-sodium cheeses. Fats and Oils Tub margarines without trans fats. Light or reduced-fat mayonnaise and salad dressings (reduced sodium). Avocado. Safflower, olive, or canola oils. Natural peanut or almond butter. Other Unsalted popcorn and pretzels. The items listed above may not be a complete list of recommended foods or beverages. Contact  your dietitian for more options. WHAT FOODS ARE NOT RECOMMENDED? Grains White bread. White pasta. White rice. Refined cornbread. Bagels and croissants. Crackers that contain trans fat. Vegetables Creamed or fried vegetables. Vegetables in a cheese sauce. Regular canned vegetables. Regular canned tomato sauce and paste. Regular tomato and vegetable juices. Fruits Dried fruits. Canned fruit in light or heavy syrup. Fruit juice. Meat and Other Protein Products Fatty cuts of meat. Ribs, chicken wings, bacon, sausage, bologna, salami, chitterlings, fatback, hot dogs, bratwurst, and packaged luncheon meats. Salted nuts and seeds. Canned beans with salt. Dairy Whole or 2% milk, cream, half-and-half, and cream cheese. Whole-fat or sweetened yogurt. Full-fat cheeses or blue cheese. Nondairy creamers and whipped toppings. Processed cheese, cheese spreads, or cheese curds. Condiments Onion and garlic salt, seasoned salt, table salt, and sea salt. Canned and packaged gravies. Worcestershire sauce. Tartar sauce. Barbecue sauce. Teriyaki sauce. Soy sauce, including reduced sodium. Steak sauce. Fish sauce. Oyster sauce. Cocktail sauce. Horseradish. Ketchup and mustard. Meat flavorings and tenderizers. Bouillon cubes. Hot sauce. Tabasco sauce. Marinades. Taco seasonings. Relishes. Fats and Oils Butter, stick  margarine, lard, shortening, ghee, and bacon fat. Coconut, palm kernel, or palm oils. Regular salad dressings. Other Pickles and olives. Salted popcorn and pretzels. The items listed above may not be a complete list of foods and beverages to avoid. Contact your dietitian for more information. WHERE CAN I FIND MORE INFORMATION? National Heart, Lung, and Blood Institute: travelstabloid.com   This information is not intended to replace advice given to you by your health care provider. Make sure you discuss any questions you have with your health care provider.   Document  Released: 03/19/2011 Document Revised: 04/20/2014 Document Reviewed: 02/01/2013 Elsevier Interactive Patient Education 2016 [place] is a normal process in which your reproductive ability comes to an end. This process happens gradually over a span of months to years, usually between the ages of 28 and 31. Menopause is complete when you have missed 12 consecutive menstrual periods. It is important to talk with your health care provider about some of the most common conditions that affect postmenopausal women, such as heart disease, cancer, and bone loss (osteoporosis). Adopting a healthy lifestyle and getting preventive care can help to promote your health and wellness. Those actions can also lower your chances of developing some of these common conditions. WHAT SHOULD I KNOW ABOUT MENOPAUSE? During menopause, you may experience a number of symptoms, such as:  Moderate-to-severe hot flashes.  Night sweats.  Decrease in sex drive.  Mood swings.  Headaches.  Tiredness.  Irritability.  Memory problems.  Insomnia. Choosing to treat or not to treat menopausal changes is an individual decision that you make with your health care provider. WHAT SHOULD I KNOW ABOUT HORMONE REPLACEMENT THERAPY AND SUPPLEMENTS? Hormone therapy products are effective for treating symptoms that are associated with menopause, such as hot flashes and night sweats. Hormone replacement carries certain risks, especially as you become older. If you are thinking about using estrogen or estrogen with progestin treatments, discuss the benefits and risks with your health care provider. WHAT SHOULD I KNOW ABOUT HEART DISEASE AND STROKE? Heart disease, heart attack, and stroke become more likely as you age. This may be due, in part, to the hormonal changes that your body experiences during menopause. These can affect how your body processes dietary fats, triglycerides, and cholesterol. Heart attack and stroke  are both medical emergencies. There are many things that you can do to help prevent heart disease and stroke:  Have your blood pressure checked at least every 1-2 years. High blood pressure causes heart disease and increases the risk of stroke.  If you are 30-101 years old, ask your health care provider if you should take aspirin to prevent a heart attack or a stroke.  Do not use any tobacco products, including cigarettes, chewing tobacco, or electronic cigarettes. If you need help quitting, ask your health care provider.  It is important to eat a healthy diet and maintain a healthy weight.  Be sure to include plenty of vegetables, fruits, low-fat dairy products, and lean protein.  Avoid eating foods that are high in solid fats, added sugars, or salt (sodium).  Get regular exercise. This is one of the most important things that you can do for your health.  Try to exercise for at least 150 minutes each week. The type of exercise that you do should increase your heart rate and make you sweat. This is known as moderate-intensity exercise.  Try to do strengthening exercises at least twice each week. Do these in addition to the moderate-intensity  exercise.  Know your numbers.Ask your health care provider to check your cholesterol and your blood glucose. Continue to have your blood tested as directed by your health care provider. WHAT SHOULD I KNOW ABOUT CANCER SCREENING? There are several types of cancer. Take the following steps to reduce your risk and to catch any cancer development as early as possible. Breast Cancer  Practice breast self-awareness.  This means understanding how your breasts normally appear and feel.  It also means doing regular breast self-exams. Let your health care provider know about any changes, no matter how small.  If you are 76 or older, have a clinician do a breast exam (clinical breast exam or CBE) every year. Depending on your age, family history, and medical  history, it may be recommended that you also have a yearly breast X-ray (mammogram).  If you have a family history of breast cancer, talk with your health care provider about genetic screening.  If you are at high risk for breast cancer, talk with your health care provider about having an MRI and a mammogram every year.  Breast cancer (BRCA) gene test is recommended for women who have family members with BRCA-related cancers. Results of the assessment will determine the need for genetic counseling and BRCA1 and for BRCA2 testing. BRCA-related cancers include these types:  Breast. This occurs in males or females.  Ovarian.  Tubal. This may also be called fallopian tube cancer.  Cancer of the abdominal or pelvic lining (peritoneal cancer).  Prostate.  Pancreatic. Cervical, Uterine, and Ovarian Cancer Your health care provider may recommend that you be screened regularly for cancer of the pelvic organs. These include your ovaries, uterus, and vagina. This screening involves a pelvic exam, which includes checking for microscopic changes to the surface of your cervix (Pap test).  For women ages 21-65, health care providers may recommend a pelvic exam and a Pap test every three years. For women ages 85-65, they may recommend the Pap test and pelvic exam, combined with testing for human papilloma virus (HPV), every five years. Some types of HPV increase your risk of cervical cancer. Testing for HPV may also be done on women of any age who have unclear Pap test results.  Other health care providers may not recommend any screening for nonpregnant women who are considered low risk for pelvic cancer and have no symptoms. Ask your health care provider if a screening pelvic exam is right for you.  If you have had past treatment for cervical cancer or a condition that could lead to cancer, you need Pap tests and screening for cancer for at least 20 years after your treatment. If Pap tests have been  discontinued for you, your risk factors (such as having a new sexual partner) need to be reassessed to determine if you should start having screenings again. Some women have medical problems that increase the chance of getting cervical cancer. In these cases, your health care provider may recommend that you have screening and Pap tests more often.  If you have a family history of uterine cancer or ovarian cancer, talk with your health care provider about genetic screening.  If you have vaginal bleeding after reaching menopause, tell your health care provider.  There are currently no reliable tests available to screen for ovarian cancer. Lung Cancer Lung cancer screening is recommended for adults 54-19 years old who are at high risk for lung cancer because of a history of smoking. A yearly low-dose CT scan of the lungs  is recommended if you:  Currently smoke.  Have a history of at least 30 pack-years of smoking and you currently smoke or have quit within the past 15 years. A pack-year is smoking an average of one pack of cigarettes per day for one year. Yearly screening should:  Continue until it has been 15 years since you quit.  Stop if you develop a health problem that would prevent you from having lung cancer treatment. Colorectal Cancer  This type of cancer can be detected and can often be prevented.  Routine colorectal cancer screening usually begins at age 25 and continues through age 41.  If you have risk factors for colon cancer, your health care provider may recommend that you be screened at an earlier age.  If you have a family history of colorectal cancer, talk with your health care provider about genetic screening.  Your health care provider may also recommend using home test kits to check for hidden blood in your stool.  A small camera at the end of a tube can be used to examine your colon directly (sigmoidoscopy or colonoscopy). This is done to check for the earliest forms  of colorectal cancer.  Direct examination of the colon should be repeated every 5-10 years until age 15. However, if early forms of precancerous polyps or small growths are found or if you have a family history or genetic risk for colorectal cancer, you may need to be screened more often. Skin Cancer  Check your skin from head to toe regularly.  Monitor any moles. Be sure to tell your health care provider:  About any new moles or changes in moles, especially if there is a change in a mole's shape or color.  If you have a mole that is larger than the size of a pencil eraser.  If any of your family members has a history of skin cancer, especially at a young age, talk with your health care provider about genetic screening.  Always use sunscreen. Apply sunscreen liberally and repeatedly throughout the day.  Whenever you are outside, protect yourself by wearing long sleeves, pants, a wide-brimmed hat, and sunglasses. WHAT SHOULD I KNOW ABOUT OSTEOPOROSIS? Osteoporosis is a condition in which bone destruction happens more quickly than new bone creation. After menopause, you may be at an increased risk for osteoporosis. To help prevent osteoporosis or the bone fractures that can happen because of osteoporosis, the following is recommended:  If you are 62-68 years old, get at least 1,000 mg of calcium and at least 600 mg of vitamin D per day.  If you are older than age 65 but younger than age 22, get at least 1,200 mg of calcium and at least 600 mg of vitamin D per day.  If you are older than age 62, get at least 1,200 mg of calcium and at least 800 mg of vitamin D per day. Smoking and excessive alcohol intake increase the risk of osteoporosis. Eat foods that are rich in calcium and vitamin D, and do weight-bearing exercises several times each week as directed by your health care provider. WHAT SHOULD I KNOW ABOUT HOW MENOPAUSE AFFECTS [place]? Depression may occur at any age, but it is  more common as you become older. Common symptoms of depression include:  Low or sad mood.  Changes in sleep patterns.  Changes in appetite or eating patterns.  Feeling an overall lack of motivation or enjoyment of activities that you previously enjoyed.  Frequent crying spells. Talk  with your health care provider if you think that you are experiencing depression. WHAT SHOULD I KNOW ABOUT IMMUNIZATIONS? It is important that you get and maintain your immunizations. These include:  Tetanus, diphtheria, and pertussis (Tdap) booster vaccine.  Influenza every year before the flu season begins.  Pneumonia vaccine.  Shingles vaccine. Your health care provider may also recommend other immunizations.   This information is not intended to replace advice given to you by your health care provider. Make sure you discuss any questions you have with your health care provider.   Document Released: 05/22/2005 Document Revised: 04/20/2014 Document Reviewed: 11/30/2013 Elsevier Interactive Patient Education [employer].

## 2016-02-04 NOTE — Progress Notes (Signed)
Vanessa Proctor, is a 60 y.o. female  [id number]  [id number]  DOB - 1955-05-23  Chief Complaint  Patient presents with  . Hypertension        Subjective:   Vanessa Proctor is a 60 y.o. female here today for a follow up visit., last seen in clinic 10/07/15. Per pt, has issues w/ rash on some bipolar meds recently, lamictal and possibly Carbamezapine. Her psychiatrist is not certain if rash is due to these meds, but rash has resolved since taking depakote. She denies any problems  taking any medications, although she is running low on some meds that she needs to pick up refills.   Her bipolar sx including mania has improved greatly as well since starting on her meds.   Overall very well for her  She just received her flu shot today. She is up-to-date on most of her type maintenance screening as well except for the DEXA screening.  She's had prior hysterectomy. No longer needs Pap smears but does still have her ovaries.  Has smoked much less frequently now given that cigarettes are so expensive.  Patient has No headache, No chest pain, No abdominal pain - No Nausea, No new weakness tingling or numbness, No Cough - SOB.  No problems updated.  ALLERGIES: Allergies  Allergen Reactions  . Bactrim Rash  . Sulfa Antibiotics Rash    PAST MEDICAL HISTORY: Past Medical History:  Diagnosis Date  . Arthritis    knees  . Constipation last month  . Coronary artery disease   . Depression   . Drug abuse   . Hypertension   . Myocardial infarction    november 1st 2012  . Nausea & vomiting last month  . Rectal bleeding   . Stroke [facility])    november, 2012  . Tobacco abuse     MEDICATIONS AT HOME: Prior to Admission medications   Medication Sig Start Date End Date Taking? Authorizing Provider  albuterol (PROVENTIL HFA;VENTOLIN HFA) 108 (90 BASE) MCG/ACT inhaler Inhale 2 puffs into the lungs every 6 (six) hours as needed for wheezing or shortness of breath. 08/13/14  Yes Gregor Hams, MD  amLODipine (NORVASC) 10 MG tablet Take 1 tablet (10 mg total) by mouth daily. 02/04/16  Yes Maren Reamer, MD  aspirin 81 MG chewable tablet Chew 1 tablet (81 mg total) by mouth daily after lunch. 02/04/16  Yes Maren Reamer, MD  atorvastatin (LIPITOR) 80 MG tablet Take 0.5 tablets (40 mg total) by mouth daily. 02/04/16  Yes Maren Reamer, MD  clopidogrel (PLAVIX) 75 MG tablet Take 1 tablet (75 mg total) by mouth daily. At lunch 02/04/16  Yes Maren Reamer, MD  divalproex (DEPAKOTE ER) 250 MG 24 hr tablet 1  qam  For 1 week then   2  qam 01/08/16  Yes Norma Fredrickson, MD  ibuprofen (ADVIL,MOTRIN) 400 MG tablet Take 1 tablet (400 mg total) by mouth every 6 (six) hours as needed. 06/10/15  Yes Delsa Grana, PA-C  losartan (COZAAR) 100 MG tablet Take 1 tablet (100 mg total) by mouth daily. 02/04/16  Yes Maren Reamer, MD  metoprolol tartrate (LOPRESSOR) 25 MG tablet Take 1 tablet (25 mg total) by mouth 2 (two) times daily. 02/04/16  Yes Raife Lizer Lazarus Gowda, MD  neomycin-polymyxin-hydrocortisone (CORTISPORIN) 3.5-10000-1 otic suspension Place 4 drops into the right ear 3 (three) times daily. 11/05/15  Yes Billy Fischer, MD  nitroGLYCERIN (NITROSTAT) 0.4 MG SL tablet Place 1 tablet (0.4 mg total)  under the tongue every 5 (five) minutes as needed for chest pain. 06/19/15  Yes Burtis Junes, NP  carbamide peroxide (DEBROX) 6.5 % otic solution Place 5 drops into the right ear 2 (two) times daily. 02/04/16   Maren Reamer, MD     Objective:   Vitals:   02/04/16 1127  BP: 112/74  Pulse: 69  Resp: 16  Temp: 98.9 F (37.2 C)  TempSrc: Oral  SpO2: 96%  Weight: 162 lb 12.8 oz (73.8 kg)    Exam General appearance : Awake, alert, not in any distress. Speech Clear. Not toxic looking, pleasant HEENT: Atraumatic and Normocephalic, pupils equally reactive to light.  Right ear with Ceruminosis. Left TM is clear Neck: supple, no JVD. No cervical lymphadenopathy.  Chest:Good air  entry bilaterally, no added sounds. CVS: S1 S2 regular, no murmurs/gallups or rubs. Abdomen: Bowel sounds active, Non tender and not distended with no gaurding, rigidity or rebound. Extremities: B/L Lower Ext shows no edema, both legs are warm to touch Neurology: Awake alert, and oriented X 3, CN II-XII grossly intact, Non focal Skin:No Rash  Data Review Lab Results  Component Value Date   HGBA1C 5.7 10/07/2015   HGBA1C 6.3 (H) 03/04/2015   HGBA1C 6.0 11/30/2013    Depression screen PHQ 2/9 08/06/2015 03/22/2013  Decreased Interest 0 0  Down, Depressed, Hopeless 0 0  PHQ - 2 Score 0 0  Altered sleeping 1 -  Tired, decreased energy 1 -  Change in appetite 0 -  Feeling bad or failure about yourself  1 -  Suicidal thoughts 0 -  PHQ-9 Score 3 -  Difficult doing work/chores Not difficult at all -      Assessment & Plan   1. HYPERTENSION, BENIGN ESSENTIAL Well-controlled, continue low-salt diet renewed all her medications  2. Hyperlipidemia, unspecified hyperlipidemia type Renewed her atorvastatin - Lipid Panel  3. Coronary artery disease involving native coronary artery of native heart without angina pectoris She is on aspirin and Plavix, continue.  Continue statin and metoprolol as well  4. Estrogen deficiency - DG Bone Density; Future  5. Bipolar I disorder ([place]) No recurrence of rash, doing well on Depakote. Will defer to psychiatry. Appreciate assistance  6. Need for hepatitis C screening test - Hepatitis C antibody   7. Flu vac today.  8. Right ear cerumen impaction.  Trial Debrox drops, come back for irrigation.  9. tob use, has reduced greatly, would recd total cessation, tips discussed  Patient have been counseled extensively about nutrition and exercise  Return in about 4 weeks (around 03/03/2016) for ear irrigation.  The patient was given clear instructions to go to ER or return to medical center if symptoms don't improve, worsen or new problems  develop. The patient verbalized understanding. The patient was told to call to get lab results if they haven't heard anything in the next week.   This note has been created with Surveyor, quantity. Any transcriptional errors are unintentional.   Maren Reamer, MD, [facility] [place], [place]   02/04/2016, 12:27 PM

## 2016-02-05 LAB — HEPATITIS C ANTIBODY: HCV Ab: REACTIVE — AB

## 2016-02-07 LAB — HEPATITIS C RNA QUANTITATIVE
HCV Quantitative Log: 6.89 {Log} — ABNORMAL HIGH (ref <1.18)
HCV Quantitative: 7762146 IU/mL — ABNORMAL HIGH (ref <15)

## 2016-02-10 ENCOUNTER — Other Ambulatory Visit: Payer: Self-pay | Admitting: Internal Medicine

## 2016-02-10 DIAGNOSIS — B1920 Unspecified viral hepatitis C without hepatic coma: Secondary | ICD-10-CM

## 2016-02-12 ENCOUNTER — Ambulatory Visit
Admission: RE | Admit: 2016-02-12 | Discharge: 2016-02-12 | Disposition: A | Discharge status: Home / Self Care | Payer: Medicare Other | Source: Ambulatory Visit | Attending: Internal Medicine | Admitting: Internal Medicine

## 2016-02-12 ENCOUNTER — Encounter: Payer: Self-pay | Admitting: Internal Medicine

## 2016-02-12 ENCOUNTER — Other Ambulatory Visit: Payer: Self-pay | Admitting: Internal Medicine

## 2016-02-12 DIAGNOSIS — M85852 Other specified disorders of bone density and structure, left thigh: Secondary | ICD-10-CM | POA: Diagnosis not present

## 2016-02-12 DIAGNOSIS — M8580 Other specified disorders of bone density and structure, unspecified site: Secondary | ICD-10-CM | POA: Insufficient documentation

## 2016-02-12 DIAGNOSIS — E2839 Other primary ovarian failure: Secondary | ICD-10-CM

## 2016-02-12 DIAGNOSIS — E559 Vitamin D deficiency, unspecified: Secondary | ICD-10-CM

## 2016-02-12 DIAGNOSIS — Z780 Asymptomatic menopausal state: Secondary | ICD-10-CM | POA: Diagnosis not present

## 2016-02-13 ENCOUNTER — Telehealth: Payer: Self-pay

## 2016-02-13 NOTE — Telephone Encounter (Signed)
Contacted pt to go over bone density and lab results phone number provided in chart is not a working number will be mailing results out

## 2016-02-17 ENCOUNTER — Encounter: Payer: Self-pay | Admitting: Internal Medicine

## 2016-02-17 DIAGNOSIS — B182 Chronic viral hepatitis C: Secondary | ICD-10-CM | POA: Insufficient documentation

## 2016-02-21 ENCOUNTER — Telehealth: Payer: Self-pay | Admitting: Internal Medicine

## 2016-02-21 NOTE — Telephone Encounter (Signed)
Pt calling to receive information to further understand her results that were mailed out Pt states the best time to contact her today is anytime after one Verified phone number with pt and states that is the best number to reach her at: [phone]

## 2016-02-21 NOTE — Telephone Encounter (Signed)
Returned pt call to go over lab results pt is aware and doesn't have any questions or concerns. Pt did schedule a lab visit for Monday November 13 @11  to get her Vit D drawn

## 2016-02-24 ENCOUNTER — Ambulatory Visit: Payer: Medicare Other | Attending: Internal Medicine

## 2016-02-24 DIAGNOSIS — M8580 Other specified disorders of bone density and structure, unspecified site: Secondary | ICD-10-CM | POA: Diagnosis not present

## 2016-02-24 DIAGNOSIS — E559 Vitamin D deficiency, unspecified: Secondary | ICD-10-CM | POA: Diagnosis not present

## 2016-02-24 NOTE — Progress Notes (Signed)
Patient here for lab visit only 

## 2016-02-25 ENCOUNTER — Other Ambulatory Visit: Payer: Self-pay | Admitting: Internal Medicine

## 2016-02-25 LAB — VITAMIN D 25 HYDROXY (VIT D DEFICIENCY, FRACTURES): Vit D, 25-Hydroxy: 16 ng/mL — ABNORMAL LOW (ref 30–100)

## 2016-02-25 MED ORDER — VITAMIN D (ERGOCALCIFEROL) 1.25 MG (50000 UNIT) PO CAPS: 50000.0000 [IU] | ORAL_CAPSULE | ORAL | 0 refills | Status: DC

## 2016-02-26 ENCOUNTER — Ambulatory Visit (INDEPENDENT_AMBULATORY_CARE_PROVIDER_SITE_OTHER): Payer: Medicare Other | Admitting: Psychiatry

## 2016-02-26 ENCOUNTER — Encounter (HOSPITAL_COMMUNITY): Payer: Self-pay | Admitting: Psychiatry

## 2016-02-26 VITALS — BP 102/68 | HR 71 | Ht 60.0 in | Wt 164.6 lb

## 2016-02-26 DIAGNOSIS — Z823 Family history of stroke: Secondary | ICD-10-CM

## 2016-02-26 DIAGNOSIS — Z9889 Other specified postprocedural states: Secondary | ICD-10-CM

## 2016-02-26 DIAGNOSIS — F3130 Bipolar disorder, current episode depressed, mild or moderate severity, unspecified: Secondary | ICD-10-CM

## 2016-02-26 DIAGNOSIS — Z8249 Family history of ischemic heart disease and other diseases of the circulatory system: Secondary | ICD-10-CM | POA: Diagnosis not present

## 2016-02-26 DIAGNOSIS — F3110 Bipolar disorder, current episode manic without psychotic features, unspecified: Secondary | ICD-10-CM | POA: Diagnosis not present

## 2016-02-26 DIAGNOSIS — Z882 Allergy status to sulfonamides status: Secondary | ICD-10-CM

## 2016-02-26 DIAGNOSIS — Z888 Allergy status to other drugs, medicaments and biological substances status: Secondary | ICD-10-CM

## 2016-02-26 DIAGNOSIS — F17210 Nicotine dependence, cigarettes, uncomplicated: Secondary | ICD-10-CM

## 2016-02-26 DIAGNOSIS — Z808 Family history of malignant neoplasm of other organs or systems: Secondary | ICD-10-CM

## 2016-02-26 MED ORDER — DIVALPROEX SODIUM ER 250 MG PO TB24: ORAL_TABLET | ORAL | 5 refills | Status: DC

## 2016-02-26 NOTE — Progress Notes (Signed)
Patient ID: Vanessa Proctor, female   DOB: May 26, 1955, 60 y.o.   MRN: [id number]  Psychiatric Initial Adult Assessment   Patient Identification: Vanessa Proctor MRN:  [id number] Date of Evaluation:  02/26/2016 Referral Source: Self-referred Chief Complaint: Weight loss  Visit Diagnosis: Bipolar disorder, type 1   ICD-9-CM ICD-10-CM   1. Bipolar I disorder, most recent episode (or current) manic ([place]) 296.40 F31.10 Valproic Acid level     COMPLETE METABOLIC PANEL WITH GFR    History of Present Illness:    Today patient is doing much better. She stopped her Tegretol and rash and hives all and way.A week later she started on Depakote 250 mg ER 2 in the morning. She is doing much better mood is even. She denies depression.She denies irritability or euphoria. She is a good app she sleeping well she can think and concentrate without problems.She is not grandiose she does not have racing thoughts she has notures of mania at all.She feels calm content iso the holidays She denies any chest pai rashes or hives. The patient recently learned that she's been diagnosed with hepatitis C. Overall though she feels much better. She is much more even. Depression Symptoms:  weight loss, (Hypo) Manic Symptoms:   Anxiety Symptoms:   Psychotic Symptoms:   PTSD Symptoms:   Past Psychiatric History: Psychiatric hospitalization at [facility] regional in 1999.  Previous Psychotropic Medications: Yes   Substance Abuse History in the last 12 months:  Yes.    Consequences of Substance Abuse:   Past Medical History:  Past Medical History:  Diagnosis Date  . Arthritis    knees  . Constipation last month  . Coronary artery disease   . Depression   . Drug abuse   . Hypertension   . Myocardial infarction    november 1st 2012  . Nausea & vomiting last month  . Rectal bleeding   . Stroke [facility])    november, 2012  . Tobacco abuse     Past Surgical History:  Procedure Laterality Date  . ABDOMINAL  HYSTERECTOMY  1985   partial  . COLONOSCOPY N/A 04/28/2013   Procedure: COLONOSCOPY;  Surgeon: Beryle Beams, MD;  Location: [facility] ENDOSCOPY;  Service: Endoscopy;  Laterality: N/A;  . CORONARY ANGIOPLASTY WITH STENT PLACEMENT  2012   x 1  . FINGER FRACTURE SURGERY Left    reduction with  splint  . knot drained and removed from side of face  2007  . TONSILLECTOMY  1980    Family Psychiatric History:   Family History:  Family History  Problem Relation Age of Onset  . Cancer Mother     mass in abdomin  . Coronary artery disease    . Pancreatic cancer Sister   . Stroke Brother   . Heart attack Maternal Grandfather   . Hypertension Maternal Grandfather     Social History:   Social History   Social History  . Marital status: Single    Spouse name: N/A  . Number of children: N/A  . Years of education: N/A   Social History Main Topics  . Smoking status: Current Every Day Smoker    Packs/day: 0.25    Years: 40.00    Types: Cigarettes  . Smokeless tobacco: Never Used     Comment: trying to quit today 02/05/14  . Alcohol use No     Comment: quit drugs 2011  marjuana  . Drug use: No     Comment: marijuana  . Sexual activity: Not  Currently   Other Topics Concern  . None   Social History Narrative  . None    Additional Social History:   Allergies:   Allergies  Allergen Reactions  . Bactrim Rash  . Sulfa Antibiotics Rash    Metabolic Disorder Labs: Lab Results  Component Value Date   HGBA1C 5.7 10/07/2015   MPG 134 03/04/2015   MPG 128 (H) 03/09/2011   No results found for: PROLACTIN Lab Results  Component Value Date   CHOL 130 02/04/2016   TRIG 178 (H) 02/04/2016   HDL 45 (L) 02/04/2016   CHOLHDL 2.9 02/04/2016   VLDL 36 (H) 02/04/2016   LDLCALC 49 02/04/2016   LDLCALC 58 03/04/2015     Current Medications: Current Outpatient Prescriptions  Medication Sig Dispense Refill  . albuterol (PROVENTIL HFA;VENTOLIN HFA) 108 (90 BASE) MCG/ACT inhaler  Inhale 2 puffs into the lungs every 6 (six) hours as needed for wheezing or shortness of breath. 1 Inhaler 2  . amLODipine (NORVASC) 10 MG tablet Take 1 tablet (10 mg total) by mouth daily. 90 tablet 3  . aspirin 81 MG chewable tablet Chew 1 tablet (81 mg total) by mouth daily after lunch. 90 tablet 3  . atorvastatin (LIPITOR) 80 MG tablet Take 0.5 tablets (40 mg total) by mouth daily. 45 tablet 2  . carbamide peroxide (DEBROX) 6.5 % otic solution Place 5 drops into the right ear 2 (two) times daily. 15 mL 0  . clopidogrel (PLAVIX) 75 MG tablet Take 1 tablet (75 mg total) by mouth daily. At lunch 90 tablet 2  . divalproex (DEPAKOTE ER) 250 MG 24 hr tablet 1  qam  For 1 week then   2  qam 60 tablet 5  . ibuprofen (ADVIL,MOTRIN) 400 MG tablet Take 1 tablet (400 mg total) by mouth every 6 (six) hours as needed. 30 tablet 0  . losartan (COZAAR) 100 MG tablet Take 1 tablet (100 mg total) by mouth daily. 90 tablet 2  . metoprolol tartrate (LOPRESSOR) 25 MG tablet Take 1 tablet (25 mg total) by mouth 2 (two) times daily. 180 tablet 2  . neomycin-polymyxin-hydrocortisone (CORTISPORIN) 3.5-10000-1 otic suspension Place 4 drops into the right ear 3 (three) times daily. 10 mL 0  . nitroGLYCERIN (NITROSTAT) 0.4 MG SL tablet Place 1 tablet (0.4 mg total) under the tongue every 5 (five) minutes as needed for chest pain. 90 tablet 3  . Vitamin D, Ergocalciferol, (DRISDOL) 50000 units CAPS capsule Take 1 capsule (50,000 Units total) by mouth every 7 (seven) days. 12 capsule 0   No current facility-administered medications for this visit.     Neurologic: Headache: No Seizure: No Paresthesias:No  Musculoskeletal: Strength & Muscle Tone: within normal limits Gait & Station: normal Patient leans  Psychiatric Specialty Exam: ROS  Blood pressure 102/68, pulse 71, height 5' (1.524 m), weight 164 lb 9.6 oz (74.7 kg).Body mass index is 32.15 kg/m.  General Appearance: Casual  Eye Contact:  Good  Speech:   Clear and Coherent  Volume:  Normal  Mood:  Negative  Affect:  Appropriate  Thought Process:  Coherent  Orientation:  Full (Time, Place, and Person)  Thought Content:  WDL  Suicidal Thoughts:  No  Homicidal Thoughts:  No  Memory:  Negative  Judgement:  Good  Insight:  Good  Psychomotor Activity:  Normal  Concentration:  Good  Recall:  Good  Fund of Knowledge:Good  Language: Fair  Akathisia:  No  Handed:  Right  AIMS (if indicated):  Assets:  Desire for Improvement  ADL's:  Intact  Cognition: WNL  Sleep:     Treatment Plan Summary: 11/15/20173:37 PM At this time the patient will continue taking Depakote 250 ER 2 in the morning. She'll get a Depakote blood leveland a comprehensive metabolic panel. The patient is in good spirits. She's not suicidal. She denies any neurological symptoms at this time. She'll return to see me in 3 months for a 30 minute visit. At that time we'll discussher diagnosis long she needs to be on the Depakote.

## 2016-02-27 DIAGNOSIS — H04123 Dry eye syndrome of bilateral lacrimal glands: Secondary | ICD-10-CM | POA: Diagnosis not present

## 2016-02-27 DIAGNOSIS — H40013 Open angle with borderline findings, low risk, bilateral: Secondary | ICD-10-CM | POA: Diagnosis not present

## 2016-02-27 DIAGNOSIS — H25811 Combined forms of age-related cataract, right eye: Secondary | ICD-10-CM | POA: Diagnosis not present

## 2016-02-27 DIAGNOSIS — H25812 Combined forms of age-related cataract, left eye: Secondary | ICD-10-CM | POA: Diagnosis not present

## 2016-03-25 DIAGNOSIS — H2511 Age-related nuclear cataract, right eye: Secondary | ICD-10-CM | POA: Diagnosis not present

## 2016-03-25 DIAGNOSIS — H25811 Combined forms of age-related cataract, right eye: Secondary | ICD-10-CM | POA: Diagnosis not present

## 2016-03-27 ENCOUNTER — Other Ambulatory Visit (HOSPITAL_COMMUNITY): Payer: Self-pay

## 2016-03-27 MED ORDER — DIVALPROEX SODIUM ER 250 MG PO TB24: ORAL_TABLET | ORAL | 1 refills | Status: DC

## 2016-03-31 ENCOUNTER — Other Ambulatory Visit: Payer: Self-pay

## 2016-03-31 MED ORDER — CLOPIDOGREL BISULFATE 75 MG PO TABS: 75.0000 mg | ORAL_TABLET | Freq: Every day | ORAL | 3 refills | Status: DC

## 2016-03-31 MED ORDER — AMLODIPINE BESYLATE 10 MG PO TABS: 10.0000 mg | ORAL_TABLET | Freq: Every day | ORAL | 3 refills | Status: DC

## 2016-03-31 MED ORDER — LOSARTAN POTASSIUM 100 MG PO TABS: 100.0000 mg | ORAL_TABLET | Freq: Every day | ORAL | 3 refills | Status: DC

## 2016-03-31 MED ORDER — METOPROLOL TARTRATE 25 MG PO TABS: 25.0000 mg | ORAL_TABLET | Freq: Two times a day (BID) | ORAL | 3 refills | Status: DC

## 2016-03-31 MED ORDER — NITROGLYCERIN 0.4 MG SL SUBL: 0.4000 mg | SUBLINGUAL_TABLET | SUBLINGUAL | 3 refills | Status: DC | PRN

## 2016-03-31 MED ORDER — ATORVASTATIN CALCIUM 80 MG PO TABS: 40.0000 mg | ORAL_TABLET | Freq: Every day | ORAL | 3 refills | Status: DC

## 2016-04-01 ENCOUNTER — Ambulatory Visit (INDEPENDENT_AMBULATORY_CARE_PROVIDER_SITE_OTHER): Payer: Medicare Other | Admitting: Internal Medicine

## 2016-04-01 ENCOUNTER — Telehealth: Payer: Self-pay | Admitting: *Deleted

## 2016-04-01 ENCOUNTER — Encounter: Payer: Self-pay | Admitting: Internal Medicine

## 2016-04-01 VITALS — BP 144/84 | HR 71 | Temp 98.5°F | Ht 61.0 in | Wt 165.5 lb

## 2016-04-01 DIAGNOSIS — I2510 Atherosclerotic heart disease of native coronary artery without angina pectoris: Secondary | ICD-10-CM | POA: Diagnosis not present

## 2016-04-01 DIAGNOSIS — B182 Chronic viral hepatitis C: Secondary | ICD-10-CM | POA: Diagnosis not present

## 2016-04-01 NOTE — Progress Notes (Signed)
Page for Infectious Disease   CC: consideration for treatment for chronic hepatitis C  HPI:  +Vanessa Proctor is a 60 y.o. female who presents for initial evaluation and management of chronic hepatitis C.  Patient tested positive earlier this year during routine screening. Hepatitis C-associated risk factors present are: none. Patient denies history of blood transfusion, IV drug abuse, multiple sexual partners, renal dialysis, sexual contact with person with liver disease, tattoos. Patient has had other studies performed. Results: hepatitis C RNA by PCR, result: positive. Patient has not had prior treatment for Hepatitis C. Patient does not have a past history of liver disease. Patient does not have a family history of liver disease. Patient does not  have associated signs or symptoms related to liver disease.  Labs reviewed and confirm chronic hepatitis C with a positive viral load.   Records reviewed from PCP and psychiatry.  On depakote and no longer on lamictal and/or carbamezapine.       Patient does not have documented immunity to Hepatitis A. Patient does not have documented immunity to Hepatitis B.    Review of Systems:  Constitutional: negative for fatigue and malaise Gastrointestinal: negative for diarrhea Musculoskeletal: negative for myalgias and arthralgias All other systems reviewed and are negative       Past Medical History:  Diagnosis Date  . Arthritis    knees  . Constipation last month  . Coronary artery disease   . Depression   . Drug abuse   . Hypertension   . Myocardial infarction    november 1st 2012  . Nausea & vomiting last month  . Rectal bleeding   . Stroke [facility])    november, 2012  . Tobacco abuse     Prior to Admission medications   Medication Sig Start Date End Date Taking? Authorizing Provider  albuterol (PROVENTIL HFA;VENTOLIN HFA) 108 (90 BASE) MCG/ACT inhaler Inhale 2 puffs into the lungs every 6 (six) hours as needed for  wheezing or shortness of breath. 08/13/14   Gregor Hams, MD  amLODipine (NORVASC) 10 MG tablet Take 1 tablet (10 mg total) by mouth daily. 03/31/16   Maren Reamer, MD  aspirin 81 MG chewable tablet Chew 1 tablet (81 mg total) by mouth daily after lunch. 02/04/16   Maren Reamer, MD  atorvastatin (LIPITOR) 80 MG tablet Take 0.5 tablets (40 mg total) by mouth daily. 03/31/16   Maren Reamer, MD  carbamide peroxide (DEBROX) 6.5 % otic solution Place 5 drops into the right ear 2 (two) times daily. 02/04/16   Maren Reamer, MD  clopidogrel (PLAVIX) 75 MG tablet Take 1 tablet (75 mg total) by mouth daily. At lunch 03/31/16   Maren Reamer, MD  divalproex (DEPAKOTE ER) 250 MG 24 hr tablet 1  qam  For 1 week then   2  qam 03/27/16   Norma Fredrickson, MD  ibuprofen (ADVIL,MOTRIN) 400 MG tablet Take 1 tablet (400 mg total) by mouth every 6 (six) hours as needed. 06/10/15   Delsa Grana, PA-C  losartan (COZAAR) 100 MG tablet Take 1 tablet (100 mg total) by mouth daily. 03/31/16   Maren Reamer, MD  metoprolol tartrate (LOPRESSOR) 25 MG tablet Take 1 tablet (25 mg total) by mouth 2 (two) times daily. 03/31/16   Maren Reamer, MD  neomycin-polymyxin-hydrocortisone (CORTISPORIN) 3.5-10000-1 otic suspension Place 4 drops into the right ear 3 (three) times daily. 11/05/15   Billy Fischer, MD  nitroGLYCERIN (NITROSTAT) 0.4  MG SL tablet Place 1 tablet (0.4 mg total) under the tongue every 5 (five) minutes as needed for chest pain. 03/31/16   Maren Reamer, MD  Vitamin D, Ergocalciferol, (DRISDOL) 50000 units CAPS capsule Take 1 capsule (50,000 Units total) by mouth every 7 (seven) days. 02/25/16   Maren Reamer, MD    Allergies  Allergen Reactions  . Bactrim Rash  . Sulfa Antibiotics Rash    Social History  Substance Use Topics  . Smoking status: Current Every Day Smoker    Packs/day: 0.25    Years: 40.00    Types: Cigarettes  . Smokeless tobacco: Never Used     Comment: trying to  quit today 02/05/14  . Alcohol use No     Comment: quit drugs 2011  marjuana    Family History  Problem Relation Age of Onset  . Cancer Mother     mass in abdomin  . Coronary artery disease    . Pancreatic cancer Sister   . Stroke Brother   . Heart attack Maternal Grandfather   . Hypertension Maternal Grandfather   no cirrhosis   Objective:  Constitutional: in no apparent distress and alert,  Vitals:   04/01/16 0926  BP: (!) 144/84  Pulse: 71  Temp: 98.5 F (36.9 C)   Eyes: anicteric Cardiovascular: Cor RRR and No murmurs Respiratory: CTA B; normal respiratory effort Gastrointestinal: Bowel sounds are normal, liver is not enlarged, spleen is not enlarged Musculoskeletal: no pedal edema noted Skin: negatives: no rash; no porphyria cutanea tarda Lymphatic: no cervical lymphadenopathy   Laboratory Genotype: No results found for: HCVGENOTYPE HCV viral load:  Lab Results  Component Value Date   HCVQUANT [id number] (H) 02/04/2016   Lab Results  Component Value Date   WBC 4.4 06/10/2015   HGB 12.2 06/10/2015   HCT 36.8 06/10/2015   MCV 86.0 06/10/2015   PLT 222 06/10/2015    Lab Results  Component Value Date   CREATININE 0.74 06/10/2015   BUN 10 06/10/2015   NA 137 06/10/2015   K 3.0 (L) 06/10/2015   CL 102 06/10/2015   CO2 25 06/10/2015    Lab Results  Component Value Date   ALT 37 06/10/2015   AST 58 (H) 06/10/2015   ALKPHOS 54 06/10/2015     Labs and history reviewed and show CHILD-PUGH A  5-6 points: Child class A 7-9 points: Child class B 10-15 points: Child class C  Lab Results  Component Value Date   INR 1.05 03/03/2015   BILITOT 0.8 06/10/2015   ALBUMIN 3.6 06/10/2015     Assessment: New Patient with Chronic Hepatitis C genotype unknown, untreated.  I discussed with the patient the lab findings that confirm chronic hepatitis C as well as the natural history and progression of disease including about 30% of people who develop cirrhosis of  the liver if left untreated and once cirrhosis is established there is a 2-7% risk per year of liver cancer and liver failure.  I discussed the importance of treatment and benefits in reducing the risk, even if significant liver fibrosis exists.   Plan: 1) Patient counseled extensively on limiting acetaminophen to no more than 2 grams daily, avoidance of alcohol. 2) Transmission discussed with patient including sexual transmission, sharing razors and toothbrush.   3) Will need referral to gastroenterology if concern for cirrhosis 4) Will need referral for substance abuse counseling: No.; Further work up to include urine drug screen  No. 5) Will prescribe appropriate medication based  on genotype and coverage - likely Harvoni or Epclusa 6) Hepatitis A and B titers 7) Pneumovax vaccine previously given 9) Further work up to include liver staging with elastography 10) will follow up after starting medication 11) potential interaction with atorvastatin so will need to monitor for myalgias

## 2016-04-01 NOTE — Patient Instructions (Signed)
Date 04/01/16  Dear Ms Moeller, As discussed in the [facility], your hepatitis C therapy will include highly effective medication(s) for treatment and will vary based on the type of hepatitis C and insurance approval.  Potential medications include:          Harvoni (sofosbuvir 90mg /ledipasvir 400mg ) tablet oral daily          OR     Epclusa (sofosbuvir 400mg /velpatasvir 100mg ) tablet oral daily          OR      Mavyret (glecaprevir 100 mg/pibrentasvir 40 mg): Take 3 tablets oral daily              Medications are typically for 8 or 12 weeks total ---------------------------------------------------------------- Your HCV Treatment Start Date: You will be notified by our office once the medication is approved and where you can pick it up (or if mailed)   ---------------------------------------------------------------- [place]:   [place] [facility] [place] Phone: [phone] Hours: Monday to Friday 7:30 am to 6:00 pm   Please always contact your pharmacy at least 3-4 business days before you run out of medications to ensure your next month's medication is ready or 1 week prior to running out if you receive it by mail.  Remember, each prescription is for 28 days. ---------------------------------------------------------------- GENERAL NOTES REGARDING YOUR HEPATITIS C MEDICATION:  Some medications have the following interactions:  - Acid reducing agents such as H2 blockers (ie. Pepcid (famotidine), Zantac (ranitidine), Tagamet (cimetidine), Axid (nizatidine) and proton pump inhibitors (ie. Prilosec (omeprazole), Protonix (pantoprazole), Nexium (esomeprazole), or Aciphex (rabeprazole)). Do not take until you have discussed with a health care provider.    -Antacids that contain magnesium and/or aluminum hydroxide (ie. Milk of Magensia, Rolaids, Gaviscon, Maalox, Mylanta, an dArthritis Pain  Formula).  -Calcium carbonate (calcium supplements or antacids such as Tums, Caltrate, Os-Cal).  -St. John's wort or any products that contain St. John's wort like some herbal supplements  Please inform the office prior to starting any of these medications.  - The common side effects associated with Harvoni include:      1. Fatigue      2. Headache      3. Nausea      4. Diarrhea      5. Insomnia  Please note that this only lists the most common side effects and is NOT a comprehensive list of the potential side effects of these medications. For more information, please review the drug information sheets that come with your medication package from the pharmacy.  ---------------------------------------------------------------- GENERAL HELPFUL HINTS ON HCV THERAPY: 1. Stay well-hydrated. 2. Notify the [facility] of any changes in your other over-the-counter/herbal or prescription medications. 3. If you miss a dose of your medication, take the missed dose as soon as you remember. Return to your regular time/dose schedule the next day.  4.  Do not stop taking your medications without first talking with your healthcare provider. 5.  You may take Tylenol (acetaminophen), as long as the dose is less than 2000 mg (OR no more than 4 tablets of the Tylenol Extra Strengths 500mg  tablet) in 24 hours. 6.  You will see our pharmacist-specialist within the first 2 weeks of starting your medication to monitor for any possible side effects. 7.  You will have labs once during treatment, soon after treatment completion and one final lab 6 months after treatment completion to verify the virus is out of your  system.  Scharlene Gloss, [place] for [place], [place]  [address] [phone]

## 2016-04-01 NOTE — Telephone Encounter (Signed)
Ultrasound appt - Wed. 04/08/16 @ [place]. 6:45AM nothing to eat or drink after midnight that morning.  Message left for the patient with this information.  Left the ph # for radiology scheduling if she needs to change this appointment.

## 2016-04-02 LAB — HEPATITIS A ANTIBODY, TOTAL: Hep A Total Ab: NONREACTIVE

## 2016-04-02 LAB — HEPATITIS B SURFACE ANTIGEN: Hepatitis B Surface Ag: NEGATIVE

## 2016-04-02 LAB — HEPATITIS B SURFACE ANTIBODY,QUALITATIVE: Hep B S Ab: POSITIVE — AB

## 2016-04-02 LAB — HEPATITIS B CORE ANTIBODY, TOTAL: Hep B Core Total Ab: NONREACTIVE

## 2016-04-04 LAB — HCV RNA, QN PCR RFLX GENO, LIPA: HCV RNA, PCR, QN: 7 log IU/mL — ABNORMAL HIGH (ref <1.18)

## 2016-04-04 LAB — HEPATITIS C GENOTYPE

## 2016-04-07 LAB — LIVER FIBROSIS, FIBROTEST-ACTITEST
ALT: 33 U/L — ABNORMAL HIGH (ref 6–29)
Alpha-2-Macroglobulin: 391 mg/dL — ABNORMAL HIGH (ref 106–279)
Apolipoprotein A1: 143 mg/dL (ref 101–198)
Bilirubin: 0.3 mg/dL (ref 0.2–1.2)
Fibrosis Score: 0.53
GGT: 57 U/L (ref 3–65)
Haptoglobin: 113 mg/dL (ref 43–212)
Necroinflammat ACT Score: 0.22
Reference ID: 1752210

## 2016-04-08 ENCOUNTER — Ambulatory Visit (HOSPITAL_COMMUNITY)
Admission: RE | Admit: 2016-04-08 | Discharge: 2016-04-08 | Disposition: A | Discharge status: Home / Self Care | Payer: Medicare Other | Source: Ambulatory Visit | Attending: Internal Medicine | Admitting: Internal Medicine

## 2016-04-08 DIAGNOSIS — B182 Chronic viral hepatitis C: Secondary | ICD-10-CM | POA: Insufficient documentation

## 2016-04-14 ENCOUNTER — Other Ambulatory Visit: Payer: Self-pay | Admitting: Internal Medicine

## 2016-04-14 MED ORDER — LEDIPASVIR-SOFOSBUVIR 90-400 MG PO TABS: 1.0000 | ORAL_TABLET | Freq: Every day | ORAL | 2 refills | Status: DC

## 2016-04-16 ENCOUNTER — Encounter: Payer: Self-pay | Admitting: Pharmacy Technician

## 2016-04-16 MED FILL — HARVONI 90-400 MG TABLET: 90-400 | 28 days supply | Qty: 28 | Fill #0

## 2016-05-13 MED FILL — HARVONI 90-400 MG TABLET: 90-400 | 28 days supply | Qty: 28 | Fill #1

## 2016-05-27 LAB — VALPROIC ACID LEVEL: Valproic Acid Lvl: 54.8 ug/mL (ref 50.0–100.0)

## 2016-05-27 LAB — COMPLETE METABOLIC PANEL WITH GFR
ALT: 16 U/L (ref 6–29)
AST: 23 U/L (ref 10–35)
Albumin: 3.8 g/dL (ref 3.6–5.1)
Alkaline Phosphatase: 68 U/L (ref 33–130)
BUN: 16 mg/dL (ref 7–25)
CO2: 27 mmol/L (ref 20–31)
Calcium: 9.7 mg/dL (ref 8.6–10.4)
Chloride: 108 mmol/L (ref 98–110)
Creat: 0.92 mg/dL (ref 0.50–0.99)
GFR, Est African American: 77 mL/min (ref >=60)
GFR, Est Non African American: 67 mL/min (ref >=60)
Glucose, Bld: 78 mg/dL (ref 65–99)
Potassium: 4.3 mmol/L (ref 3.5–5.3)
Sodium: 141 mmol/L (ref 135–146)
Total Bilirubin: 0.5 mg/dL (ref 0.2–1.2)
Total Protein: 7 g/dL (ref 6.1–8.1)

## 2016-05-29 ENCOUNTER — Ambulatory Visit (HOSPITAL_COMMUNITY): Payer: Self-pay | Admitting: Psychiatry

## 2016-06-12 MED FILL — HARVONI 90-400 MG TABLET: 90-400 | 28 days supply | Qty: 28 | Fill #2

## 2016-06-15 ENCOUNTER — Telehealth: Payer: Self-pay | Admitting: Pharmacist

## 2016-06-15 NOTE — Telephone Encounter (Signed)
Vanessa Proctor called and stated she had missed 2 doses of her Harvoni because she ran out of it on Friday 3/2. She was waiting on [place] or the pharmacy to call and refill it for her.  I discussed how important it was to not miss any doses with her Harvoni.  She cannot get away with just 2 months as her baseline Hep C VL is 7 million and she is Sales promotion account executive. She will need the full 12 weeks. Told her to pick it up today and start taking it tonight and to make sure not to miss anymore doses or she might fail.  For some reason she was not scheduled for a 2 week follow up appointment, so I made an appointment for the end of the month to do a EOT viral load.

## 2016-07-09 ENCOUNTER — Ambulatory Visit (INDEPENDENT_AMBULATORY_CARE_PROVIDER_SITE_OTHER): Payer: Medicare HMO | Admitting: Pharmacist

## 2016-07-09 DIAGNOSIS — B182 Chronic viral hepatitis C: Secondary | ICD-10-CM | POA: Diagnosis not present

## 2016-07-09 NOTE — Progress Notes (Signed)
HPI: Vanessa Proctor is a 61 y.o. female who presents to the [facility] today for EOT follow-up of her Hep C infection.  She has genotype 1a, F2 fibrosis score, and started Harvoni on 04/17/16.  Lab Results  Component Value Date   HCVGENOTYPE 1a 04/01/2016    Allergies: Allergies  Allergen Reactions  . Bactrim Rash  . Sulfa Antibiotics Rash    Past Medical History: Past Medical History:  Diagnosis Date  . Arthritis    knees  . Constipation last month  . Coronary artery disease   . Depression   . Drug abuse   . Hypertension   . Myocardial infarction    november 1st 2012  . Nausea & vomiting last month  . Rectal bleeding   . Stroke [facility])    november, 2012  . Tobacco abuse     Social History: Social History   Social History  . Marital status: Single    Spouse name: N/A  . Number of children: N/A  . Years of education: N/A   Social History Main Topics  . Smoking status: Current Every Day Smoker    Packs/day: 0.25    Years: 40.00    Types: Cigarettes  . Smokeless tobacco: Never Used     Comment: trying to quit today 02/05/14  . Alcohol use No     Comment: quit drugs 2011  marjuana  . Drug use: Yes     Comment: marijuana occass  . Sexual activity: Not Currently   Other Topics Concern  . Not on file   Social History Narrative  . No narrative on file    Labs: Hep B S Ab (no units)  Date Value  04/01/2016 POS (A)   Hepatitis B Surface Ag (no units)  Date Value  04/01/2016 NEGATIVE   HCV Ab (no units)  Date Value  02/04/2016 REACTIVE (A)    Lab Results  Component Value Date   HCVGENOTYPE 1a 04/01/2016    Hepatitis C RNA quantitative Latest Ref Rng & Units 02/04/2016  HCV Quantitative <15 IU/mL 4,268,341(D)  HCV Quantitative Log <1.18 log 10 6.89(H)    AST (U/L)  Date Value  05/26/2016 23  06/10/2015 58 (H)  03/22/2013 42 (H)   ALT (U/L)  Date Value  05/26/2016 16  04/01/2016 33 (H)  06/10/2015 37  03/22/2013 38 (H)    INR (no units)  Date Value  03/03/2015 1.05  02/17/2011 0.95  02/13/2011 1.04    CrCl: CrCl cannot be calculated (Patient's most recent lab result is older than the maximum 21 days allowed.).  Fibrosis Score: F2 as assessed by elastography   Previous Treatment Regimen: None  Assessment: Carrell is here today for her EOT Hep C follow-up appointment. She will finish Harvoni on Sunday 4/1.  She did miss two doses of Harvoni when there was some confusion about refills from the pharmacy.  She did not call in for refills and thus did not pick her her refill in time to not miss any doses.  She usually takes the Harvoni at bedtime but does admit to taking it in the morning on 2 occasions and doubling up and taking another dose that night.  I told her she should have refrained from taking it twice a day but we will hope she is cured.  She did not come in for a 2 week appointment for some reason so we do not have an early treatment viral load.  She does complain of some constipation and  bloating since starting Harvoni but also admits to struggling with constipation for quite some time.  She attributes it to all the medication she is taking for her prior heart attack and stroke. I gave her some recommendations for increasing fiber in her diet and drinking more water.  She did say she suffered from fatigue at the beginning of treatment but it got better over time.  She did not experience any other side effects.  We will get a viral load today, and I will make her cure visits.   Plans: - Complete Harvoni on Sunday 4/1 - Hep C viral load today - F/u SVR lab visit on 7/2 at 3:45pm - F/u SVR pharmacy visit on 7/9 at [place]. [place], PharmD, [place] [facility] 07/09/2016, 4:17 PM

## 2016-07-13 LAB — HEPATITIS C RNA QUANTITATIVE
HCV Quantitative Log: <1.18 Log IU/mL
HCV Quantitative: <15 IU/mL

## 2016-07-16 ENCOUNTER — Encounter (HOSPITAL_COMMUNITY): Payer: Self-pay | Admitting: Psychiatry

## 2016-07-16 ENCOUNTER — Ambulatory Visit (INDEPENDENT_AMBULATORY_CARE_PROVIDER_SITE_OTHER): Payer: Medicare HMO | Admitting: Psychiatry

## 2016-07-16 VITALS — BP 126/74 | HR 76 | Ht 61.0 in | Wt 173.8 lb

## 2016-07-16 DIAGNOSIS — F17210 Nicotine dependence, cigarettes, uncomplicated: Secondary | ICD-10-CM | POA: Diagnosis not present

## 2016-07-16 DIAGNOSIS — Z79899 Other long term (current) drug therapy: Secondary | ICD-10-CM | POA: Diagnosis not present

## 2016-07-16 DIAGNOSIS — F3160 Bipolar disorder, current episode mixed, unspecified: Secondary | ICD-10-CM

## 2016-07-16 MED ORDER — DIVALPROEX SODIUM ER 250 MG PO TB24: ORAL_TABLET | ORAL | 1 refills | Status: DC

## 2016-07-16 NOTE — Progress Notes (Signed)
5 view Patient ID: Vanessa Proctor, female   DOB: 10/15/1955, 61 y.o.   MRN: [id number]  Psychiatric Initial Adult Assessment   Patient Identification: Vanessa Proctor MRN:  [id number] Date of Evaluation:  07/16/2016 Referral Source: Self-referred Chief Complaint: Weight loss  Visit Diagnosis: Bipolar disorder, type 1 No diagnosis found.  History of Present Illness:    Today the patient is doing well. She's finished her treatment for hepatitis C. He did make her tired but now she has more energy. Her wishes to move away from her brother. The will be read her father in the next few weeks. When the settlement's done patient plans to find her own place to live. Her brother gotten a big argument with the patient's daughter said there is conflict and tension amongst her family members. Overall the patient shows no evidence of mania. Her mood is stable. She is actually sleeping and eating very well. She drinks no alcohol. She's got good energy. She says now the weather is better she can start walking again. She takes her Depakote with no problems. Her recent level came back at 74. Her liver enzymes don't look all that bad for person is recovering from hepatitis C. (Hypo) Manic Symptoms:   Anxiety Symptoms:   Psychotic Symptoms:   PTSD Symptoms:   Past Psychiatric History: Psychiatric hospitalization at [facility] in 1999.  Previous Psychotropic Medications: Yes   Substance Abuse History in the last 12 months:  Yes.    Consequences of Substance Abuse:   Past Medical History:  Past Medical History:  Diagnosis Date  . Arthritis    knees  . Constipation last month  . Coronary artery disease   . Depression   . Drug abuse   . Hypertension   . Myocardial infarction    november 1st 2012  . Nausea & vomiting last month  . Rectal bleeding   . Stroke [facility])    november, 2012  . Tobacco abuse     Past Surgical History:  Procedure Laterality Date  . ABDOMINAL HYSTERECTOMY   1985   partial  . COLONOSCOPY N/A 04/28/2013   Procedure: COLONOSCOPY;  Surgeon: Beryle Beams, MD;  Location: [facility] END[facility]COPY;  Service: Endoscopy;  Laterality: N/A;  . CORONARY ANGIOPLASTY WITH STENT PLACEMENT  2012   x 1  . FINGER FRACTURE SURGERY Left    reduction with  splint  . knot drained and removed from side of face  2007  . TONSILLECTOMY  1980    Family Psychiatric History:   Family History:  Family History  Problem Relation Age of Onset  . Cancer Mother     mass in abdomin  . Coronary artery disease    . Pancreatic cancer Sister   . Stroke Brother   . Heart attack Maternal Grandfather   . Hypertension Maternal Grandfather     Social History:   Social History   Social History  . Marital status: Single    Spouse name: N/A  . Number of children: N/A  . Years of education: N/A   Social History Main Topics  . Smoking status: Current Every Day Smoker    Packs/day: 0.25    Years: 40.00    Types: Cigarettes  . Smokeless tobacco: Never Used     Comment: trying to quit today 02/05/14  . Alcohol use No     Comment: quit drugs 2011  marjuana  . Drug use: Yes     Comment: marijuana occass  . Sexual activity:  Not Currently   Other Topics Concern  . None   Social History Narrative  . None    Additional Social History:   Allergies:   Allergies  Allergen Reactions  . Bactrim Rash  . Sulfa Antibiotics Rash    Metabolic Disorder Labs: Lab Results  Component Value Date   HGBA1C 5.7 10/07/2015   MPG 134 03/04/2015   MPG 128 (H) 03/09/2011   No results found for: PROLACTIN Lab Results  Component Value Date   CHOL 130 02/04/2016   TRIG 178 (H) 02/04/2016   HDL 45 (L) 02/04/2016   CHOLHDL 2.9 02/04/2016   VLDL 36 (H) 02/04/2016   LDLCALC 49 02/04/2016   LDLCALC 58 03/04/2015     Current Medications: Current Outpatient Prescriptions  Medication Sig Dispense Refill  . albuterol (PROVENTIL HFA;VENTOLIN HFA) 108 (90 BASE) MCG/ACT inhaler Inhale 2  puffs into the lungs every 6 (six) hours as needed for wheezing or shortness of breath. 1 Inhaler 2  . amLODipine (NORVASC) 10 MG tablet Take 1 tablet (10 mg total) by mouth daily. 90 tablet 3  . aspirin 81 MG chewable tablet Chew 1 tablet (81 mg total) by mouth daily after lunch. 90 tablet 3  . atorvastatin (LIPITOR) 80 MG tablet Take 0.5 tablets (40 mg total) by mouth daily. 45 tablet 3  . carbamide peroxide (DEBROX) 6.5 % otic solution Place 5 drops into the right ear 2 (two) times daily. 15 mL 0  . clopidogrel (PLAVIX) 75 MG tablet Take 1 tablet (75 mg total) by mouth daily. At lunch 90 tablet 3  . divalproex (DEPAKOTE ER) 250 MG 24 hr tablet 1  qam  For 1 week then   2  qam 180 tablet 1  . ibuprofen (ADVIL,MOTRIN) 400 MG tablet Take 1 tablet (400 mg total) by mouth every 6 (six) hours as needed. 30 tablet 0  . Ledipasvir-Sofosbuvir (HARVONI) 90-400 MG TABS Take 1 tablet by mouth daily. 28 tablet 2  . losartan (COZAAR) 100 MG tablet Take 1 tablet (100 mg total) by mouth daily. 90 tablet 3  . metoprolol tartrate (LOPRESSOR) 25 MG tablet Take 1 tablet (25 mg total) by mouth 2 (two) times daily. 180 tablet 3  . neomycin-polymyxin-hydrocortisone (CORTISPORIN) 3.5-10000-1 otic suspension Place 4 drops into the right ear 3 (three) times daily. 10 mL 0  . nitroGLYCERIN (NITROSTAT) 0.4 MG SL tablet Place 1 tablet (0.4 mg total) under the tongue every 5 (five) minutes as needed for chest pain. 90 tablet 3  . Vitamin D, Ergocalciferol, (DRISDOL) 50000 units CAPS capsule Take 1 capsule (50,000 Units total) by mouth every 7 (seven) days. 12 capsule 0   No current facility-administered medications for this visit.     Neurologic: Headache: No Seizure: No Paresthesias:No  Musculoskeletal: Strength & Muscle Tone: within normal limits Gait & Station: normal Patient leans  Psychiatric Specialty Exam: ROS  Blood pressure 126/74, pulse 76, height 5\' 1"  (1.549 m), weight 173 lb 12.8 oz (78.8 kg).Body  mass index is 32.84 kg/m.  General Appearance: Casual  Eye Contact:  Good  Speech:  Clear and Coherent  Volume:  Normal  Mood:  Negative  Affect:  Appropriate  Thought Process:  Coherent  Orientation:  Full (Time, Place, and Person)  Thought Content:  WDL  Suicidal Thoughts:  No  Homicidal Thoughts:  No  Memory:  Negative  Judgement:  Good  Insight:  Good  Psychomotor Activity:  Normal  Concentration:  Good  Recall:  Good  Fund of  Knowledge:Good  Language: Fair  Akathisia:  No  Handed:  Right  AIMS (if indicated):    Assets:  Desire for Improvement  ADL's:  Intact  Cognition: WNL  Sleep:     Treatment Plan Summary: 4/5/20183:38 PM  At this time the patient is doing well. She'll continue taking Depakote 250 mg ER 2 in the morning. Patient will stay active. She is not suicidal she denies chest pain shortness of breath or any neurological symptoms at this time. The patients in good spirits. She'll return to see me in 4 months for a 30 minute visit. At that time will be time for doing a more thorough evaluation.

## 2016-07-20 ENCOUNTER — Other Ambulatory Visit (HOSPITAL_COMMUNITY): Payer: Self-pay

## 2016-07-20 MED ORDER — DIVALPROEX SODIUM ER 250 MG PO TB24: ORAL_TABLET | ORAL | 1 refills | Status: DC

## 2016-08-10 ENCOUNTER — Other Ambulatory Visit: Payer: Self-pay | Admitting: Internal Medicine

## 2016-08-10 DIAGNOSIS — Z1231 Encounter for screening mammogram for malignant neoplasm of breast: Secondary | ICD-10-CM

## 2016-08-24 ENCOUNTER — Ambulatory Visit: Payer: Medicare HMO | Attending: Internal Medicine | Admitting: Internal Medicine

## 2016-08-24 ENCOUNTER — Encounter: Payer: Self-pay | Admitting: Internal Medicine

## 2016-08-24 VITALS — BP 131/77 | HR 91 | Temp 98.5°F | Resp 16 | Wt 179.0 lb

## 2016-08-24 DIAGNOSIS — R600 Localized edema: Secondary | ICD-10-CM | POA: Diagnosis not present

## 2016-08-24 DIAGNOSIS — Z79899 Other long term (current) drug therapy: Secondary | ICD-10-CM | POA: Diagnosis not present

## 2016-08-24 DIAGNOSIS — I252 Old myocardial infarction: Secondary | ICD-10-CM | POA: Insufficient documentation

## 2016-08-24 DIAGNOSIS — M25569 Pain in unspecified knee: Secondary | ICD-10-CM | POA: Diagnosis present

## 2016-08-24 DIAGNOSIS — F319 Bipolar disorder, unspecified: Secondary | ICD-10-CM | POA: Insufficient documentation

## 2016-08-24 DIAGNOSIS — Z7982 Long term (current) use of aspirin: Secondary | ICD-10-CM | POA: Insufficient documentation

## 2016-08-24 DIAGNOSIS — B182 Chronic viral hepatitis C: Secondary | ICD-10-CM | POA: Diagnosis not present

## 2016-08-24 DIAGNOSIS — I2510 Atherosclerotic heart disease of native coronary artery without angina pectoris: Secondary | ICD-10-CM | POA: Insufficient documentation

## 2016-08-24 DIAGNOSIS — Z7902 Long term (current) use of antithrombotics/antiplatelets: Secondary | ICD-10-CM | POA: Insufficient documentation

## 2016-08-24 DIAGNOSIS — I10 Essential (primary) hypertension: Secondary | ICD-10-CM

## 2016-08-24 DIAGNOSIS — Z8673 Personal history of transient ischemic attack (TIA), and cerebral infarction without residual deficits: Secondary | ICD-10-CM | POA: Diagnosis not present

## 2016-08-24 DIAGNOSIS — R7303 Prediabetes: Secondary | ICD-10-CM | POA: Insufficient documentation

## 2016-08-24 MED ORDER — AMLODIPINE BESYLATE 10 MG PO TABS: 10.0000 mg | ORAL_TABLET | Freq: Every day | ORAL | 3 refills | Status: DC

## 2016-08-24 MED ORDER — ASPIRIN 81 MG PO CHEW: 81.0000 mg | CHEWABLE_TABLET | Freq: Every day | ORAL | 3 refills | Status: DC

## 2016-08-24 MED ORDER — ATORVASTATIN CALCIUM 80 MG PO TABS: 40.0000 mg | ORAL_TABLET | Freq: Every day | ORAL | 3 refills | Status: DC

## 2016-08-24 MED ORDER — FUROSEMIDE 20 MG PO TABS: 20.0000 mg | ORAL_TABLET | Freq: Every day | ORAL | 0 refills | Status: DC

## 2016-08-24 MED ORDER — CLOPIDOGREL BISULFATE 75 MG PO TABS: 75.0000 mg | ORAL_TABLET | Freq: Every day | ORAL | 3 refills | Status: DC

## 2016-08-24 MED ORDER — MEDICAL COMPRESSION STOCKINGS MISC
1.0000 | Freq: Every day | 0 refills | Status: AC
Start: 2016-08-24 — End: ?

## 2016-08-24 MED ORDER — LOSARTAN POTASSIUM 100 MG PO TABS: 100.0000 mg | ORAL_TABLET | Freq: Every day | ORAL | 3 refills | Status: DC

## 2016-08-24 MED ORDER — METOPROLOL TARTRATE 25 MG PO TABS: 25.0000 mg | ORAL_TABLET | Freq: Two times a day (BID) | ORAL | 3 refills | Status: DC

## 2016-08-24 MED ORDER — MEDICAL COMPRESSION STOCKINGS MISC: 1.0000 | Freq: Every day | 0 refills | Status: DC

## 2016-08-24 NOTE — Progress Notes (Signed)
Vanessa Proctor, is a 61 y.o. female  JQB:[id number]  [id number]  DOB - 24-Jul-1955  Chief Complaint  Patient presents with  . Knee Pain        Subjective:   Vanessa Proctor is a 61 y.o. female here today for a follow up visit for htn, bipolar I, predm. She was last seen 02/04/16. Overall doing well, and taking all her meds. She noted LE leg swelling x 2 wks now, worse last week. Denies any current leg pains or recent travels. She does try to watch her salt, but does like to eat chinese vegs.  Has been doing with her depakote, mood has been ok, tends to take it at night.  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 Tries to stay off her feet for too long due to body pains.   Patient has No headache, No chest pain, No abdominal pain - No Nausea, No new weakness tingling or numbness, No Cough - SOB.  No problems updated.  ALLERGIES: Allergies  Allergen Reactions  . Bactrim Rash  . Sulfa Antibiotics Rash    PAST MEDICAL HISTORY: Past Medical History:  Diagnosis Date  . Arthritis    knees  . Constipation last month  . Coronary artery disease   . Depression   . Drug abuse   . Hypertension   . Myocardial infarction [facility])    november 1st 2012  . Nausea & vomiting last month  . Rectal bleeding   . Stroke [facility])    november, 2012  . Tobacco abuse     MEDICATIONS AT HOME: Prior to Admission medications   Medication Sig Start Date End Date Taking? Authorizing Provider  albuterol (PROVENTIL HFA;VENTOLIN HFA) 108 (90 BASE) MCG/ACT inhaler Inhale 2 puffs into the lungs every 6 (six) hours as needed for wheezing or shortness of breath. 08/13/14   Gregor Hams, MD  amLODipine (NORVASC) 10 MG tablet Take 1 tablet (10 mg total) by mouth daily. 08/24/16   Maren Reamer, MD  aspirin 81 MG chewable tablet Chew 1 tablet (81 mg total) by mouth daily after lunch. 08/24/16   Maren Reamer, MD  atorvastatin (LIPITOR) 80 MG  tablet Take 0.5 tablets (40 mg total) by mouth daily. 08/24/16   Maren Reamer, MD  carbamide peroxide (DEBROX) 6.5 % otic solution Place 5 drops into the right ear 2 (two) times daily. Patient not taking: Reported on 08/24/2016 02/04/16   Lottie Mussel T, MD  clopidogrel (PLAVIX) 75 MG tablet Take 1 tablet (75 mg total) by mouth daily. At lunch 08/24/16   Lottie Mussel T, MD  divalproex (DEPAKOTE ER) 250 MG 24 hr tablet 1  qam  For 1 week then   2  qam 07/20/16   Norma Fredrickson, MD  Elastic Bandages & Supports ([place]) [place] 1 Package by Does not apply route daily. Dispense 1 pair, to fit, wear daily, take off at night 08/24/16   Ivori Storr T, MD  furosemide (LASIX) 20 MG tablet Take 1 tablet (20 mg total) by mouth daily. 08/24/16   Maren Reamer, MD  ibuprofen (ADVIL,MOTRIN) 400 MG tablet Take 1 tablet (400 mg total) by mouth every 6 (six) hours as needed. 06/10/15   Delsa Grana, PA-C  Ledipasvir-Sofosbuvir (HARVONI) 90-400 MG TABS Take 1 tablet by mouth daily. 04/14/16   Comer, Okey Regal, MD  losartan (COZAAR) 100 MG tablet Take 1 tablet (100 mg total) by mouth  daily. 08/24/16   Maren Reamer, MD  metoprolol tartrate (LOPRESSOR) 25 MG tablet Take 1 tablet (25 mg total) by mouth 2 (two) times daily. 08/24/16   Maren Reamer, MD  neomycin-polymyxin-hydrocortisone (CORTISPORIN) 3.5-10000-1 otic suspension Place 4 drops into the right ear 3 (three) times daily. Patient not taking: Reported on 08/24/2016 11/05/15   Billy Fischer, MD  nitroGLYCERIN (NITROSTAT) 0.4 MG SL tablet Place 1 tablet (0.4 mg total) under the tongue every 5 (five) minutes as needed for chest pain. Patient not taking: Reported on 08/24/2016 03/31/16   Maren Reamer, MD  Vitamin D, Ergocalciferol, (DRISDOL) 50000 units CAPS capsule Take 1 capsule (50,000 Units total) by mouth every 7 (seven) days. Patient not taking: Reported on 08/24/2016 02/25/16   Lottie Mussel T, MD     Objective:    Vitals:   08/24/16 1526  BP: 131/77  Pulse: 91  Resp: 16  Temp: 98.5 F (36.9 C)  TempSrc: Oral  SpO2: 96%  Weight: 179 lb (81.2 kg)    Exam General appearance : Awake, alert, not in any distress. Speech Clear. Not toxic looking, pleasant. HEENT: Atraumatic and Normocephalic, pupils equally reactive to light. Neck: supple, no JVD.  Chest:Good air entry bilaterally, no added sounds. CVS: S1 S2 regular, no murmurs/gallups or rubs. Abdomen: Bowel sounds active, Non tender and not distended with no gaurding, rigidity or rebound. Extremities: B/L Lower Ext shows 2+ edema to bilat calves, neg Homan's sign bilat, both legs are warm to touch Neurology: Awake alert, and oriented X 3, CN II-XII grossly intact, Non focal Skin:No Rash  Data Review Lab Results  Component Value Date   HGBA1C 5.7 10/07/2015   HGBA1C 6.3 (H) 03/04/2015   HGBA1C 6.0 11/30/2013    Depression screen [facility] 2/9 08/24/2016 02/04/2016 08/06/2015 03/22/2013  Decreased Interest 1 2 0 0  Down, Depressed, Hopeless 1 3 0 0  PHQ - 2 Score 2 5 0 0  Altered sleeping 1 1 1  -  Tired, decreased energy 1 2 1  -  Change in appetite 1 3 0 -  Feeling bad or failure about yourself  0 3 1 -  Trouble concentrating 0 2 - -  Moving slowly or fidgety/restless 0 1 - -  Suicidal thoughts 0 3 0 -  PHQ-9 Score 5 20 3  -  Difficult doing work/chores - - Not difficult at all -      Assessment & Plan   1. Edema, lower extremity ? 2nd to salt load, her bp is normal, low salt diet encouraged. - Brain natriuretic peptide - Basic metabolic panel - lasix 20 qd x 7days - rx compression ted hose, wear during day, elevate legs at night  2. Chronic hepatitis C without hepatic coma (HCC) Complete Harvoni on Sunday 4/1, per ID.  3. HYPERTENSION, BENIGN ESSENTIAL Actually controlled, Renewed cozaar 100 qd, norvasc 10, metoprolol 25bid.  4. Bipolar I disorder ([place]) On depakote, per psychiactry  5. Prediabetes Info on diet provided  to prevent dm. - Hemoglobin A1c     Patient have been counseled extensively about nutrition and exercise  Return in about 2 weeks (around 09/07/2016) for lower leg swelling.  The patient was given clear instructions to go to ER or return to medical center if symptoms don't improve, worsen or new problems develop. The patient verbalized understanding. The patient was told to call to get lab results if they haven't heard anything in the next week.   This note has been created with [employer]  Geophysicist/field seismologist. Any transcriptional errors are unintentional.   Maren Reamer, MD, [facility] [place], [place]   08/24/2016, 4:18 PM

## 2016-08-24 NOTE — Patient Instructions (Addendum)
Salt <2 gram /day  - Calcium 1200mg  /day + Vit D 5,000 IU  / daily    Edema Edema is when you have too much fluid in your body or under your skin. Edema may make your legs, feet, and ankles swell up. Swelling is also common in looser tissues, like around your eyes. This is a common condition. It gets more common as you get older. There are many possible causes of edema. Eating too much salt (sodium) and being on your feet or sitting for a long time can cause edema in your legs, feet, and ankles. Hot weather may make edema worse. Edema is usually painless. Your skin may look swollen or shiny. Follow these instructions at home:  Keep the swollen body part raised (elevated) above the level of your heart when you are sitting or lying down.  Do not sit still or stand for a long time.  Do not wear tight clothes. Do not wear garters on your upper legs.  Exercise your legs. This can help the swelling go down.  Wear elastic bandages or support stockings as told by your doctor.  Eat a low-salt (low-sodium) diet to reduce fluid as told by your doctor.  Depending on the cause of your swelling, you may need to limit how much fluid you drink (fluid restriction).  Take over-the-counter and prescription medicines only as told by your doctor. Contact a doctor if:  Treatment is not working.  You have heart, liver, or kidney disease and have symptoms of edema.  You have sudden and unexplained weight gain. Get help right away if:  You have shortness of breath or chest pain.  You cannot breathe when you lie down.  You have pain, redness, or warmth in the swollen areas.  You have heart, liver, or kidney disease and get edema all of a sudden.  You have a fever and your symptoms get worse all of a sudden. Summary  Edema is when you have too much fluid in your body or under your skin.  Edema may make your legs, feet, and ankles swell up. Swelling is also common in looser tissues, like around  your eyes.  Raise (elevate) the swollen body part above the level of your heart when you are sitting or lying down.  Follow your doctor's instructions about diet and how much fluid you can drink (fluid restriction). This information is not intended to replace advice given to you by your health care provider. Make sure you discuss any questions you have with your health care provider. Document Released: 09/16/2007 Document Revised: 04/17/2016 Document Reviewed: 04/17/2016 Elsevier Interactive Patient Education  2017 Elsevier Inc.  -   Low-Sodium Eating Plan Sodium, which is an element that makes up salt, helps you maintain a healthy balance of fluids in your body. Too much sodium can increase your blood pressure and cause fluid and waste to be held in your body. Your health care provider or dietitian may recommend following this plan if you have high blood pressure (hypertension), kidney disease, liver disease, or heart failure. Eating less sodium can help lower your blood pressure, reduce swelling, and protect your heart, liver, and kidneys. What are tips for following this plan? General guidelines   Most people on this plan should limit their sodium intake to 1,500-2,000 mg (milligrams) of sodium each day. Reading food labels   The Nutrition Facts label lists the amount of sodium in one serving of the food. If you eat more than one serving, you must  multiply the listed amount of sodium by the number of servings.  Choose foods with less than 140 mg of sodium per serving.  Avoid foods with 300 mg of sodium or more per serving. Shopping   Look for lower-sodium products, often labeled as "low-sodium" or "no salt added."  Always check the sodium content even if foods are labeled as "unsalted" or "no salt added".  Buy fresh foods.  Avoid canned foods and premade or frozen meals.  Avoid canned, cured, or processed meats  Buy breads that have less than 80 mg of sodium per  slice. Cooking   Eat more home-cooked food and less restaurant, buffet, and fast food.  Avoid adding salt when cooking. Use salt-free seasonings or herbs instead of table salt or sea salt. Check with your health care provider or pharmacist before using salt substitutes.  Cook with plant-based oils, such as canola, sunflower, or olive oil. Meal planning   When eating at a restaurant, ask that your food be prepared with less salt or no salt, if possible.  Avoid foods that contain MSG (monosodium glutamate). MSG is sometimes added to [place] food, bouillon, and some canned foods. What foods are recommended? The items listed may not be a complete list. Talk with your dietitian about what dietary choices are best for you. Grains  Low-sodium cereals, including oats, puffed wheat and rice, and shredded wheat. Low-sodium crackers. Unsalted rice. Unsalted pasta. Low-sodium bread. Whole-grain breads and whole-grain pasta. Vegetables  Fresh or frozen vegetables. "No salt added" canned vegetables. "No salt added" tomato sauce and paste. Low-sodium or reduced-sodium tomato and vegetable juice. Fruits  Fresh, frozen, or canned fruit. Fruit juice. Meats and other protein foods  Fresh or frozen (no salt added) meat, poultry, seafood, and fish. Low-sodium canned tuna and salmon. Unsalted nuts. Dried peas, beans, and lentils without added salt. Unsalted canned beans. Eggs. Unsalted nut butters. Dairy  Milk. Soy milk. Cheese that is naturally low in sodium, such as ricotta cheese, fresh mozzarella, or Swiss cheese Low-sodium or reduced-sodium cheese. Cream cheese. Yogurt. Fats and oils  Unsalted butter. Unsalted margarine with no trans fat. Vegetable oils such as canola or olive oils. Seasonings and other foods  Fresh and dried herbs and spices. Salt-free seasonings. Low-sodium mustard and ketchup. Sodium-free salad dressing. Sodium-free light mayonnaise. Fresh or refrigerated horseradish. Lemon juice.  Vinegar. Homemade, reduced-sodium, or low-sodium soups. Unsalted popcorn and pretzels. Low-salt or salt-free chips. What foods are not recommended? The items listed may not be a complete list. Talk with your dietitian about what dietary choices are best for you. Grains  Instant hot cereals. Bread stuffing, pancake, and biscuit mixes. Croutons. Seasoned rice or pasta mixes. Noodle soup cups. Boxed or frozen macaroni and cheese. Regular salted crackers. Self-rising flour. Vegetables  Sauerkraut, pickled vegetables, and relishes. Olives. [place] fries. Onion rings. Regular canned vegetables (not low-sodium or reduced-sodium). Regular canned tomato sauce and paste (not low-sodium or reduced-sodium). Regular tomato and vegetable juice (not low-sodium or reduced-sodium). Frozen vegetables in sauces. Meats and other protein foods  Meat or fish that is salted, canned, smoked, spiced, or pickled. Bacon, ham, sausage, hotdogs, corned beef, chipped beef, packaged lunch meats, salt pork, jerky, pickled herring, anchovies, regular canned tuna, sardines, salted nuts. Dairy  Processed cheese and cheese spreads. Cheese curds. Blue cheese. Feta cheese. String cheese. Regular cottage cheese. Buttermilk. Canned milk. Fats and oils  Salted butter. Regular margarine. Ghee. Bacon fat. Seasonings and other foods  Onion salt, garlic salt, seasoned salt, table salt,  and sea salt. Canned and packaged gravies. Worcestershire sauce. Tartar sauce. Barbecue sauce. Teriyaki sauce. Soy sauce, including reduced-sodium. Steak sauce. Fish sauce. Oyster sauce. Cocktail sauce. Horseradish that you find on the shelf. Regular ketchup and mustard. Meat flavorings and tenderizers. Bouillon cubes. Hot sauce and Tabasco sauce. Premade or packaged marinades. Premade or packaged taco seasonings. Relishes. Regular salad dressings. Salsa. Potato and tortilla chips. Corn chips and puffs. Salted popcorn and pretzels. Canned or dried soups. Pizza.  Frozen entrees and pot pies. Summary  Eating less sodium can help lower your blood pressure, reduce swelling, and protect your heart, liver, and kidneys.  Most people on this plan should limit their sodium intake to 1,500-2,000 mg (milligrams) of sodium each day.  Canned, boxed, and frozen foods are high in sodium. Restaurant foods, fast foods, and pizza are also very high in sodium. You also get sodium by adding salt to food.  Try to cook at home, eat more fresh fruits and vegetables, and eat less fast food, canned, processed, or prepared foods. This information is not intended to replace advice given to you by your health care provider. Make sure you discuss any questions you have with your health care provider. Document Released: 09/19/2001 Document Revised: 03/23/2016 Document Reviewed: 03/23/2016 Elsevier Interactive Patient Education  2017 Elsevier Inc.  -   Low-Sodium Eating Plan Sodium, which is an element that makes up salt, helps you maintain a healthy balance of fluids in your body. Too much sodium can increase your blood pressure and cause fluid and waste to be held in your body. Your health care provider or dietitian may recommend following this plan if you have high blood pressure (hypertension), kidney disease, liver disease, or heart failure. Eating less sodium can help lower your blood pressure, reduce swelling, and protect your heart, liver, and kidneys. What are tips for following this plan? General guidelines   Most people on this plan should limit their sodium intake to 1,500-2,000 mg (milligrams) of sodium each day. Reading food labels   The Nutrition Facts label lists the amount of sodium in one serving of the food. If you eat more than one serving, you must multiply the listed amount of sodium by the number of servings.  Choose foods with less than 140 mg of sodium per serving.  Avoid foods with 300 mg of sodium or more per serving. Shopping   Look for  lower-sodium products, often labeled as "low-sodium" or "no salt added."  Always check the sodium content even if foods are labeled as "unsalted" or "no salt added".  Buy fresh foods.  Avoid canned foods and premade or frozen meals.  Avoid canned, cured, or processed meats  Buy breads that have less than 80 mg of sodium per slice. Cooking   Eat more home-cooked food and less restaurant, buffet, and fast food.  Avoid adding salt when cooking. Use salt-free seasonings or herbs instead of table salt or sea salt. Check with your health care provider or pharmacist before using salt substitutes.  Cook with plant-based oils, such as canola, sunflower, or olive oil. Meal planning   When eating at a restaurant, ask that your food be prepared with less salt or no salt, if possible.  Avoid foods that contain MSG (monosodium glutamate). MSG is sometimes added to [place] food, bouillon, and some canned foods. What foods are recommended? The items listed may not be a complete list. Talk with your dietitian about what dietary choices are best for you. Grains  Low-sodium cereals,  including oats, puffed wheat and rice, and shredded wheat. Low-sodium crackers. Unsalted rice. Unsalted pasta. Low-sodium bread. Whole-grain breads and whole-grain pasta. Vegetables  Fresh or frozen vegetables. "No salt added" canned vegetables. "No salt added" tomato sauce and paste. Low-sodium or reduced-sodium tomato and vegetable juice. Fruits  Fresh, frozen, or canned fruit. Fruit juice. Meats and other protein foods  Fresh or frozen (no salt added) meat, poultry, seafood, and fish. Low-sodium canned tuna and salmon. Unsalted nuts. Dried peas, beans, and lentils without added salt. Unsalted canned beans. Eggs. Unsalted nut butters. Dairy  Milk. Soy milk. Cheese that is naturally low in sodium, such as ricotta cheese, fresh mozzarella, or Swiss cheese Low-sodium or reduced-sodium cheese. Cream cheese. Yogurt. Fats and  oils  Unsalted butter. Unsalted margarine with no trans fat. Vegetable oils such as canola or olive oils. Seasonings and other foods  Fresh and dried herbs and spices. Salt-free seasonings. Low-sodium mustard and ketchup. Sodium-free salad dressing. Sodium-free light mayonnaise. Fresh or refrigerated horseradish. Lemon juice. Vinegar. Homemade, reduced-sodium, or low-sodium soups. Unsalted popcorn and pretzels. Low-salt or salt-free chips. What foods are not recommended? The items listed may not be a complete list. Talk with your dietitian about what dietary choices are best for you. Grains  Instant hot cereals. Bread stuffing, pancake, and biscuit mixes. Croutons. Seasoned rice or pasta mixes. Noodle soup cups. Boxed or frozen macaroni and cheese. Regular salted crackers. Self-rising flour. Vegetables  Sauerkraut, pickled vegetables, and relishes. Olives. [place] fries. Onion rings. Regular canned vegetables (not low-sodium or reduced-sodium). Regular canned tomato sauce and paste (not low-sodium or reduced-sodium). Regular tomato and vegetable juice (not low-sodium or reduced-sodium). Frozen vegetables in sauces. Meats and other protein foods  Meat or fish that is salted, canned, smoked, spiced, or pickled. Bacon, ham, sausage, hotdogs, corned beef, chipped beef, packaged lunch meats, salt pork, jerky, pickled herring, anchovies, regular canned tuna, sardines, salted nuts. Dairy  Processed cheese and cheese spreads. Cheese curds. Blue cheese. Feta cheese. String cheese. Regular cottage cheese. Buttermilk. Canned milk. Fats and oils  Salted butter. Regular margarine. Ghee. Bacon fat. Seasonings and other foods  Onion salt, garlic salt, seasoned salt, table salt, and sea salt. Canned and packaged gravies. Worcestershire sauce. Tartar sauce. Barbecue sauce. Teriyaki sauce. Soy sauce, including reduced-sodium. Steak sauce. Fish sauce. Oyster sauce. Cocktail sauce. Horseradish that you find on the  shelf. Regular ketchup and mustard. Meat flavorings and tenderizers. Bouillon cubes. Hot sauce and Tabasco sauce. Premade or packaged marinades. Premade or packaged taco seasonings. Relishes. Regular salad dressings. Salsa. Potato and tortilla chips. Corn chips and puffs. Salted popcorn and pretzels. Canned or dried soups. Pizza. Frozen entrees and pot pies. Summary  Eating less sodium can help lower your blood pressure, reduce swelling, and protect your heart, liver, and kidneys.  Most people on this plan should limit their sodium intake to 1,500-2,000 mg (milligrams) of sodium each day.  Canned, boxed, and frozen foods are high in sodium. Restaurant foods, fast foods, and pizza are also very high in sodium. You also get sodium by adding salt to food.  Try to cook at home, eat more fresh fruits and vegetables, and eat less fast food, canned, processed, or prepared foods. This information is not intended to replace advice given to you by your health care provider. Make sure you discuss any questions you have with your health care provider. Document Released: 09/19/2001 Document Revised: 03/23/2016 Document Reviewed: 03/23/2016 Elsevier Interactive Patient Education  2017 [employer].   -  Preventing Type 2 Diabetes Mellitus Type 2 diabetes (type 2 diabetes mellitus) is a long-term (chronic) disease that affects blood sugar (glucose) levels. Normally, a hormone called insulin allows glucose to enter cells in the body. The cells use glucose for energy. In type 2 diabetes, one or both of these problems may be present:  The body does not make enough insulin.  The body does not respond properly to insulin that it makes (insulin resistance). Insulin resistance or lack of insulin causes excess glucose to build up in the blood instead of going into cells. As a result, high blood glucose (hyperglycemia) develops, which can cause many complications. Being overweight or obese and having an inactive  (sedentary) lifestyle can increase your risk for diabetes. Type 2 diabetes can be delayed or prevented by making certain nutrition and lifestyle changes. What nutrition changes can be made?  Eat healthy meals and snacks regularly. Keep a healthy snack with you for when you get hungry between meals, such as fruit or a handful of nuts.  Eat lean meats and proteins that are low in saturated fats, such as chicken, fish, egg whites, and beans. Avoid processed meats.  Eat plenty of fruits and vegetables and plenty of grains that have not been processed (whole grains). It is recommended that you eat:  1?2 cups of fruit every day.  2?3 cups of vegetables every day.  6?8 oz of whole grains every day, such as oats, whole wheat, bulgur, brown rice, quinoa, and millet.  Eat low-fat dairy products, such as milk, yogurt, and cheese.  Eat foods that contain healthy fats, such as nuts, avocado, olive oil, and canola oil.  Drink water throughout the day. Avoid drinks that contain added sugar, such as soda or sweet tea.  Follow instructions from your health care provider about specific eating or drinking restrictions.  Control how much food you eat at a time (portion size).  Check food labels to find out the serving sizes of foods.  Use a kitchen scale to weigh amounts of foods.  Saute or steam food instead of frying it. Cook with water or broth instead of oils or butter.  Limit your intake of:  Salt (sodium). Have no more than 1 tsp (2,400 mg) of sodium a day. If you have heart disease or high blood pressure, have less than ? tsp (1,500 mg) of sodium a day.  Saturated fat. This is fat that is solid at room temperature, such as butter or fat on meat. What lifestyle changes can be made?   Activity   Do moderate-intensity physical activity for at least 30 minutes on at least 5 days of the week, or as much as told by your health care provider.  Ask your health care provider what activities  are safe for you. A mix of physical activities may be best, such as walking, swimming, cycling, and strength training.  Try to add physical activity into your day. For example:  Park in spots that are farther away than usual, so that you walk more. For example, park in a far corner of the parking lot when you go to the office or the grocery store.  Take a walk during your lunch break.  Use stairs instead of elevators or escalators. Weight Loss   Lose weight as directed. Your health care provider can determine how much weight loss is best for you and can help you lose weight safely.  If you are overweight or obese, you may be instructed to lose at  least 5?7 % of your body weight. Alcohol and Tobacco      Do not use any tobacco products, such as cigarettes, chewing tobacco, and e-cigarettes. If you need help quitting, ask your health care provider. Work With [place] Provider   Have your blood glucose tested regularly, as told by your health care provider.  Discuss your risk factors and how you can reduce your risk for diabetes.  Get screening tests as told by your health care provider. You may have screening tests regularly, especially if you have certain risk factors for type 2 diabetes.  Make an appointment with a diet and nutrition specialist (registered dietitian). A registered dietitian can help you make a healthy eating plan and can help you understand portion sizes and food labels. Why are these changes important?  It is possible to prevent or delay type 2 diabetes and related health problems by making lifestyle and nutrition changes.  It can be difficult to recognize signs of type 2 diabetes. The best way to avoid possible damage to your body is to take actions to prevent the disease before you develop symptoms. What can happen if changes are not made?  Your blood glucose levels may keep increasing. Having high blood glucose for a long time is dangerous. Too much  glucose in your blood can damage your blood vessels, heart, kidneys, nerves, and eyes.  You may develop prediabetes or type 2 diabetes. Type 2 diabetes can lead to many chronic health problems and complications, such as:  Heart disease.  Stroke.  Blindness.  Kidney disease.  Depression.  Poor circulation in the feet and legs, which could lead to surgical removal (amputation) in severe cases. Where to find support:  Ask your health care provider to recommend a registered dietitian, diabetes educator, or weight loss program.  Look for local or online weight loss groups.  Join a gym, fitness club, or outdoor activity group, such as a walking club. Where to find more information: To learn more about diabetes and diabetes prevention, visit:  American Diabetes Association (ADA): www.diabetes.[employer] of Diabetes and Digestive and Kidney Diseases: FindSpin.nl To learn more about healthy eating, visit:  The U.S. Department of Agriculture Scientist, research (physical sciences)), Choose My Plate: http://wiley-williams.com/  Office of Disease Prevention and Health Promotion (ODPHP), Dietary Guidelines: SurferLive.at Summary  You can reduce your risk for type 2 diabetes by increasing your physical activity, eating healthy foods, and losing weight as directed.  Talk with your health care provider about your risk for type 2 diabetes. Ask about any blood tests or screening tests that you need to have. This information is not intended to replace advice given to you by your health care provider. Make sure you discuss any questions you have with your health care provider. Document Released: 07/22/2015 Document Revised: 09/05/2015 Document Reviewed: 05/21/2015 Elsevier Interactive Patient Education  2017 [employer].

## 2016-08-25 ENCOUNTER — Other Ambulatory Visit: Payer: Self-pay | Admitting: Internal Medicine

## 2016-08-25 LAB — BASIC METABOLIC PANEL
BUN/Creatinine Ratio: 28 (ref 12–28)
BUN: 18 mg/dL (ref 8–27)
CO2: 24 mmol/L (ref 18–29)
Calcium: 9.9 mg/dL (ref 8.7–10.3)
Chloride: 102 mmol/L (ref 96–106)
Creatinine, Ser: 0.65 mg/dL (ref 0.57–1.00)
GFR calc Af Amer: 109 mL/min/{1.73_m2} (ref >59)
GFR calc non Af Amer: 95 mL/min/{1.73_m2} (ref >59)
Glucose: 81 mg/dL (ref 65–99)
Potassium: 4.1 mmol/L (ref 3.5–5.2)
Sodium: 140 mmol/L (ref 134–144)

## 2016-08-25 LAB — HEMOGLOBIN A1C
Est. average glucose Bld gHb Est-mCnc: 126 mg/dL
Hgb A1c MFr Bld: 6 % — ABNORMAL HIGH (ref 4.8–5.6)

## 2016-08-25 LAB — BRAIN NATRIURETIC PEPTIDE: BNP: 8 pg/mL (ref 0.0–100.0)

## 2016-08-25 MED ORDER — METFORMIN HCL 500 MG PO TABS: 500.0000 mg | ORAL_TABLET | Freq: Every day | ORAL | 3 refills | Status: DC

## 2016-08-27 ENCOUNTER — Encounter: Payer: Self-pay | Admitting: Internal Medicine

## 2016-09-14 ENCOUNTER — Ambulatory Visit: Payer: Self-pay

## 2016-09-16 ENCOUNTER — Ambulatory Visit: Payer: Self-pay

## 2016-10-12 ENCOUNTER — Other Ambulatory Visit: Payer: Medicare HMO

## 2016-10-12 DIAGNOSIS — B182 Chronic viral hepatitis C: Secondary | ICD-10-CM

## 2016-10-15 LAB — HEPATITIS C RNA QUANTITATIVE
HCV Quantitative Log: <1.18 Log IU/mL
HCV Quantitative: <15 IU/mL

## 2016-10-19 ENCOUNTER — Ambulatory Visit (INDEPENDENT_AMBULATORY_CARE_PROVIDER_SITE_OTHER): Payer: Medicare HMO | Admitting: Pharmacist

## 2016-10-19 DIAGNOSIS — B182 Chronic viral hepatitis C: Secondary | ICD-10-CM | POA: Diagnosis not present

## 2016-10-19 MED FILL — metFORMIN HCL 500 MG TABS: 500 | 90 days supply | Qty: 90 | Fill #0

## 2016-10-19 NOTE — Progress Notes (Signed)
HPI: Vanessa Proctor is a 61 y.o. female who presents to the [place] clinic for her Hep C cure visit. She had genotype 1a, F2 fibrosis, and completed 12 weeks of Harvoni on 4/1.   Lab Results  Component Value Date   HCVGENOTYPE 1a 04/01/2016    Allergies: Allergies  Allergen Reactions  . Bactrim Rash  . Sulfa Antibiotics Rash    Past Medical History: Past Medical History:  Diagnosis Date  . Arthritis    knees  . Constipation last month  . Coronary artery disease   . Depression   . Drug abuse   . Hypertension   . Myocardial infarction [facility]    november 1st 2012  . Nausea & vomiting last month  . Rectal bleeding   . Stroke [facility])    november, 2012  . Tobacco abuse     Social History: Social History   Social History  . Marital status: Single    Spouse name: N/A  . Number of children: N/A  . Years of education: N/A   Social History Main Topics  . Smoking status: Current Every Day Smoker    Packs/day: 0.25    Years: 40.00    Types: Cigarettes  . Smokeless tobacco: Never Used     Comment: trying to quit today 02/05/14  . Alcohol use No     Comment: quit drugs 2011  marjuana  . Drug use: Yes     Comment: marijuana occass  . Sexual activity: Not Currently   Other Topics Concern  . Not on file   Social History Narrative  . No narrative on file    Labs: Hep B S Ab (no units)  Date Value  04/01/2016 POS (A)   Hepatitis B Surface Ag (no units)  Date Value  04/01/2016 NEGATIVE   HCV Ab (no units)  Date Value  02/04/2016 REACTIVE (A)    Lab Results  Component Value Date   HCVGENOTYPE 1a 04/01/2016    Hepatitis C RNA quantitative Latest Ref Rng & Units 10/12/2016 07/09/2016 02/04/2016  HCV Quantitative NOT DETECTED IU/mL <15 NOT DETECTED <15 NOT DETECTED 2,426,834(H)  HCV Quantitative Log NOT DETECTED Log IU/mL <1.18 NOT DETECTED <1.18 NOT DETECTED 6.89(H)    AST (U/L)  Date Value  05/26/2016 23  06/10/2015 58 (H)  03/22/2013 42 (H)    ALT (U/L)  Date Value  05/26/2016 16  04/01/2016 33 (H)  06/10/2015 37  03/22/2013 38 (H)   INR (no units)  Date Value  03/03/2015 1.05  02/17/2011 0.95  02/13/2011 1.04    CrCl: CrCl cannot be calculated (Patient's most recent lab result is older than the maximum 21 days allowed.).  Fibrosis Score: F2 as assessed by ARFI   Child-Pugh Score: A  Previous Treatment Regimen: none  Assessment: Vanessa Proctor is here today for her Hep C cure visit.  She completed 12 weeks of Harvoni back on the first of April. She did not have any issues tolerating it or taking it.  She did miss a couple of doses.  She came in last week for her SVR12 and she was undetectable. She is now cured of her Hep C. Congratulated her and explained to her that it isn't a vaccine and that she could become reinfected if she did risky behavior.    Plans: - Cured of Hep C - RTC PRN  Cassie L. Kuppelweiser, PharmD, [facility] 10/19/2016, 3:35 PM

## 2016-10-30 ENCOUNTER — Ambulatory Visit
Admission: RE | Admit: 2016-10-30 | Discharge: 2016-10-30 | Disposition: A | Discharge status: Home / Self Care | Payer: Medicare HMO | Source: Ambulatory Visit | Attending: Internal Medicine | Admitting: Internal Medicine

## 2016-10-30 DIAGNOSIS — Z1231 Encounter for screening mammogram for malignant neoplasm of breast: Secondary | ICD-10-CM

## 2016-11-03 ENCOUNTER — Other Ambulatory Visit: Payer: Self-pay | Admitting: Family Medicine

## 2016-11-03 ENCOUNTER — Other Ambulatory Visit: Payer: Self-pay | Admitting: Internal Medicine

## 2016-11-03 DIAGNOSIS — R928 Other abnormal and inconclusive findings on diagnostic imaging of breast: Secondary | ICD-10-CM

## 2016-11-05 ENCOUNTER — Other Ambulatory Visit: Payer: Self-pay | Admitting: Internal Medicine

## 2016-11-05 ENCOUNTER — Ambulatory Visit
Admission: RE | Admit: 2016-11-05 | Discharge: 2016-11-05 | Disposition: A | Discharge status: Home / Self Care | Payer: Medicare HMO | Source: Ambulatory Visit | Attending: *Deleted | Admitting: *Deleted

## 2016-11-05 ENCOUNTER — Other Ambulatory Visit: Payer: Self-pay | Admitting: Family Medicine

## 2016-11-05 DIAGNOSIS — R921 Mammographic calcification found on diagnostic imaging of breast: Secondary | ICD-10-CM

## 2016-11-05 DIAGNOSIS — R928 Other abnormal and inconclusive findings on diagnostic imaging of breast: Secondary | ICD-10-CM

## 2016-11-10 ENCOUNTER — Other Ambulatory Visit: Payer: Self-pay | Admitting: Internal Medicine

## 2016-11-10 DIAGNOSIS — R921 Mammographic calcification found on diagnostic imaging of breast: Secondary | ICD-10-CM

## 2016-11-12 IMAGING — CT CT HEAD W/O CM
2 series · 15 of 30 positions shown, 17 images · non-contrast
Comparison: MR brain 03/10/2011.  CT head 03/08/2011

CLINICAL DATA: At least two Syncopal episodes in the past 2 weeks.
Nausea, dizziness, posterior neck pain. No current symptoms. [place]
Elsayd Jeje.

EXAM:
CT HEAD WITHOUT CONTRAST
TECHNIQUE: Contiguous axial images were obtained from the base of the skull
through the vertex without intravenous contrast.

[Series 2: head without · axial · non-contrast · 0.43mm/px · z∈[-65,+55]mm · 7 of 32 slices shown, 9 images]
[im 4/32  brain]
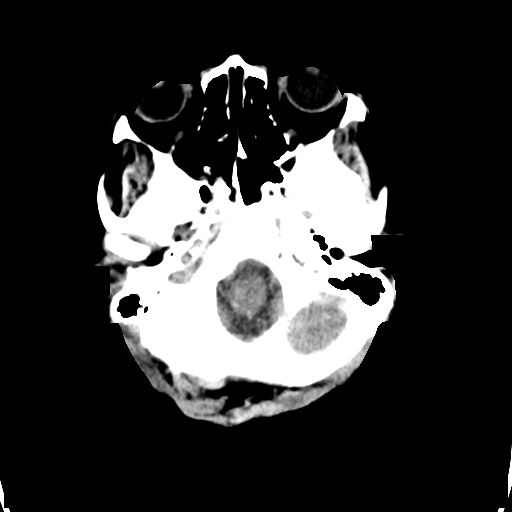
[im 4/32  bone]
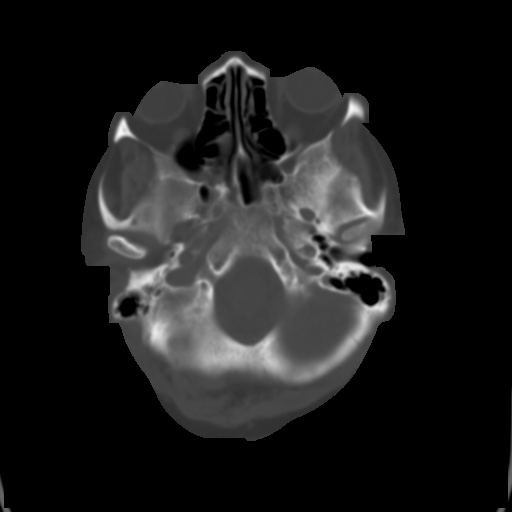
[im 8/32  brain]
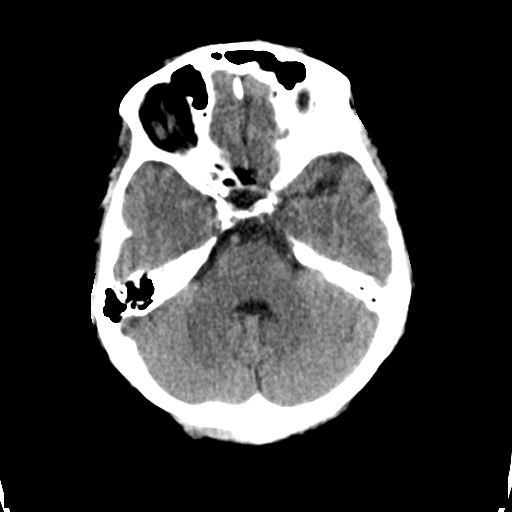
[im 12/32  brain]
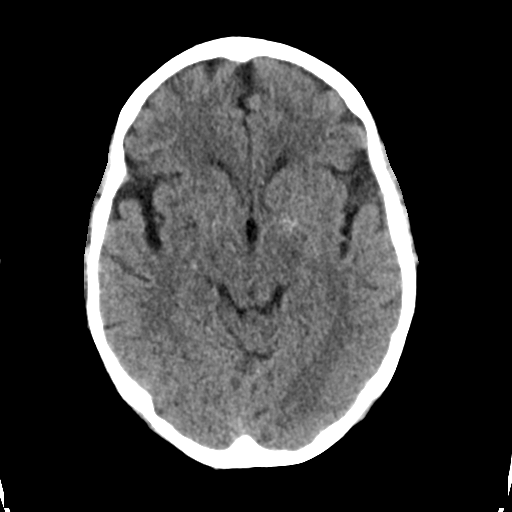
[im 16/32  brain]
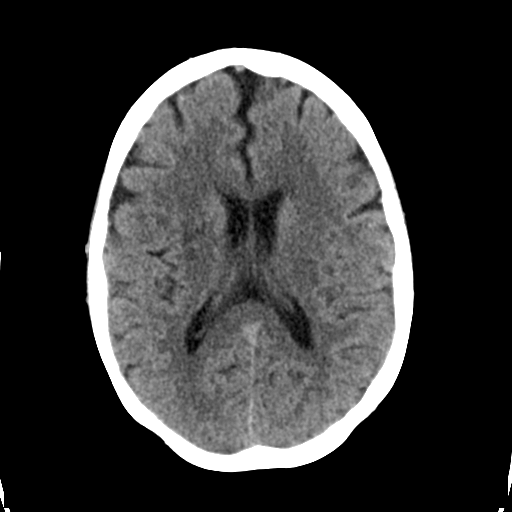
[im 20/32  brain]
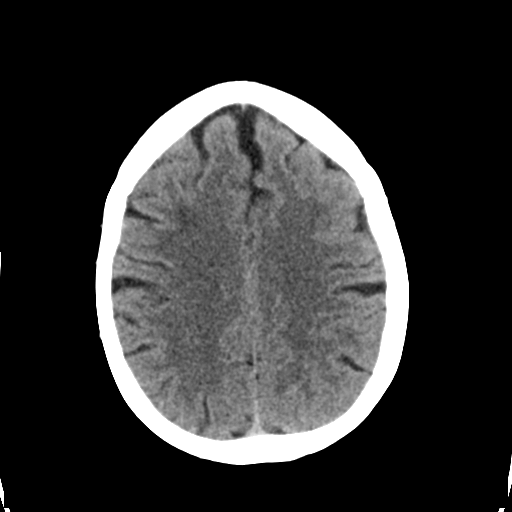
[im 20/32  bone]
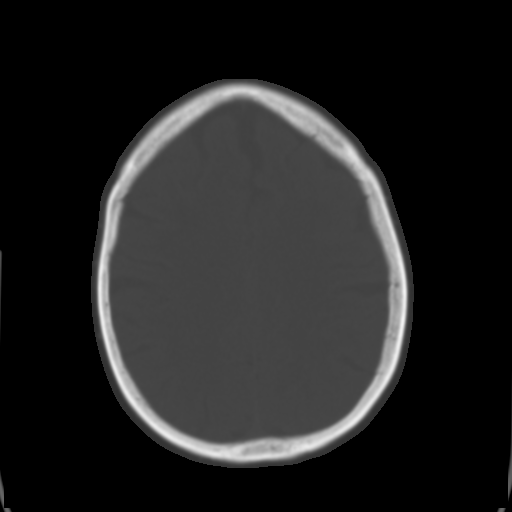
[im 24/32  brain]
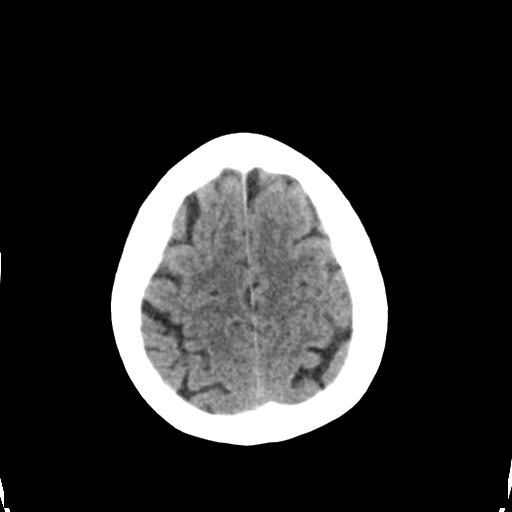
[im 28/32  brain]
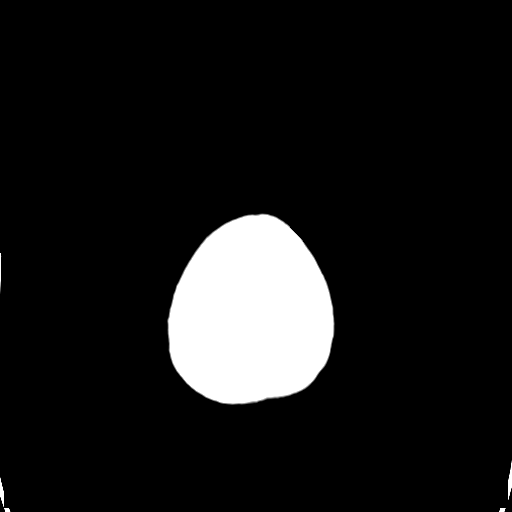

[Series 3: head bone · axial · 0.43mm/px · z∈[-66,+58]mm · 8 of 78 slices shown]
[im 8/78  bone]
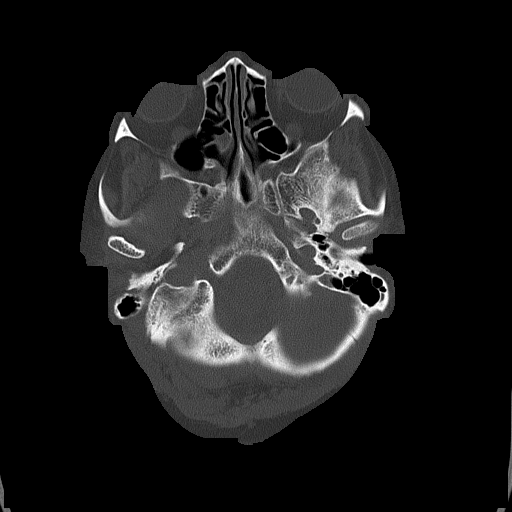
[im 16/78  bone]
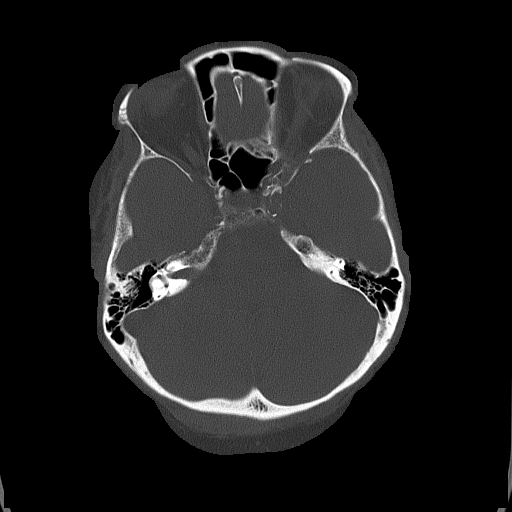
[im 24/78  bone]
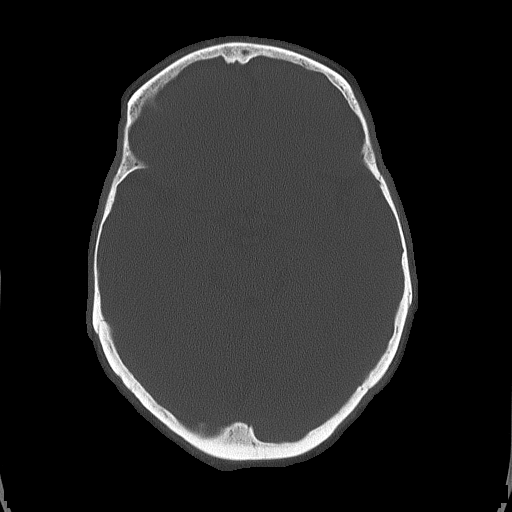
[im 35/78  bone]
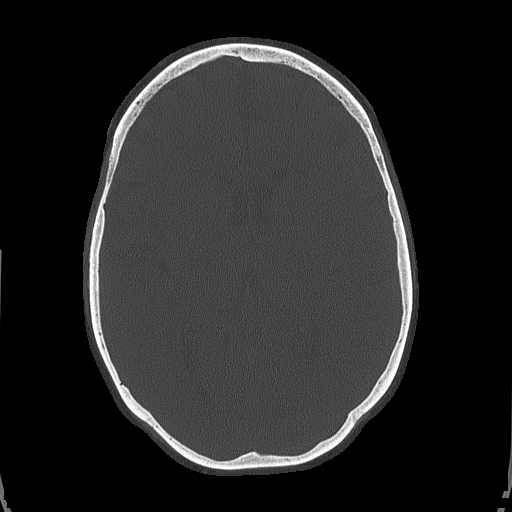
[im 43/78  bone]
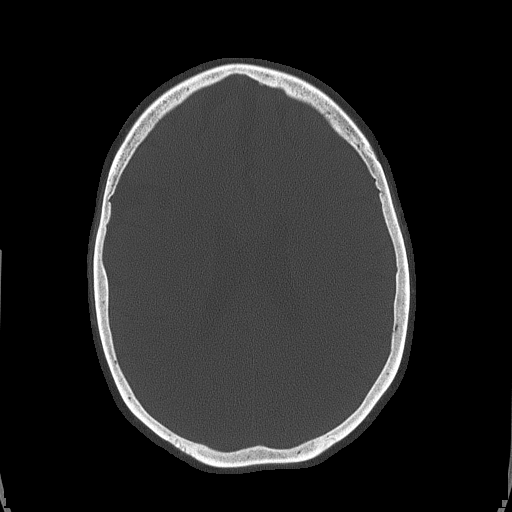
[im 54/78  bone]
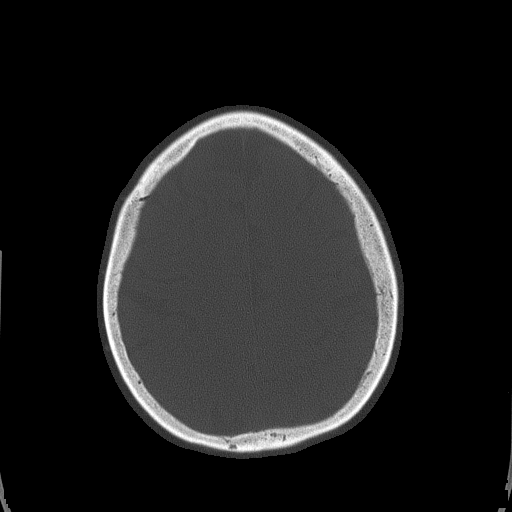
[im 62/78  bone]
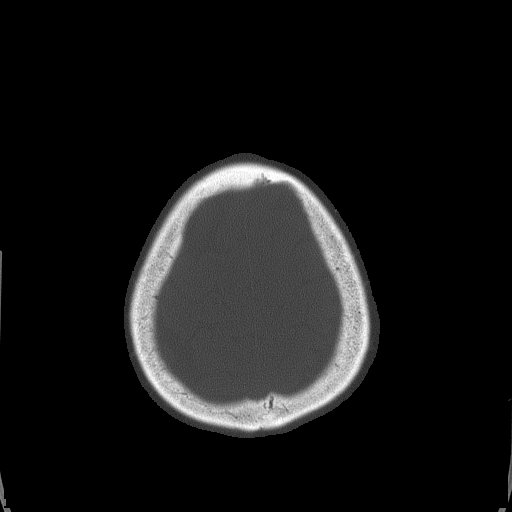
[im 70/78  bone]
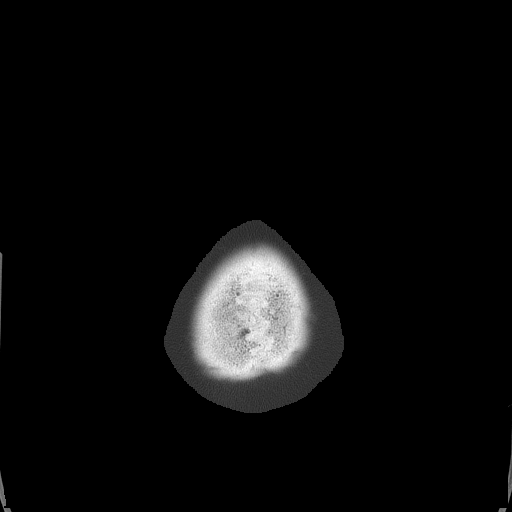

[15 of 30 positions shown; findings below may reference images not displayed]

FINDINGS: No evidence for acute infarction, hemorrhage, mass lesion,
hydrocephalus, or extra-axial fluid. Slight premature for age
cerebral atrophy. Hypoattenuation of white matter representing
chronic microvascular ischemic change. Evidence for remote deep
white matter and deep nuclei lacunar infarcts.

The calvarium is intact. Vascular calcifications noted. No layering
sinus or mastoid fluid. Negative orbits.

Progression of small vessel disease compared with priors.
IMPRESSION: No acute intracranial findings.  Chronic changes as described.

## 2016-11-12 IMAGING — CR DG CHEST 2V
2 series · 2 of 2 positions shown · non-contrast
Comparison: 03/08/2011 and 02/12/2011 radiographs.

CLINICAL DATA: Syncopal episode today. Shortness of breath. History
of hypertension and myocardial infarction.

EXAM:
CHEST  2 VIEW

[chest lat]
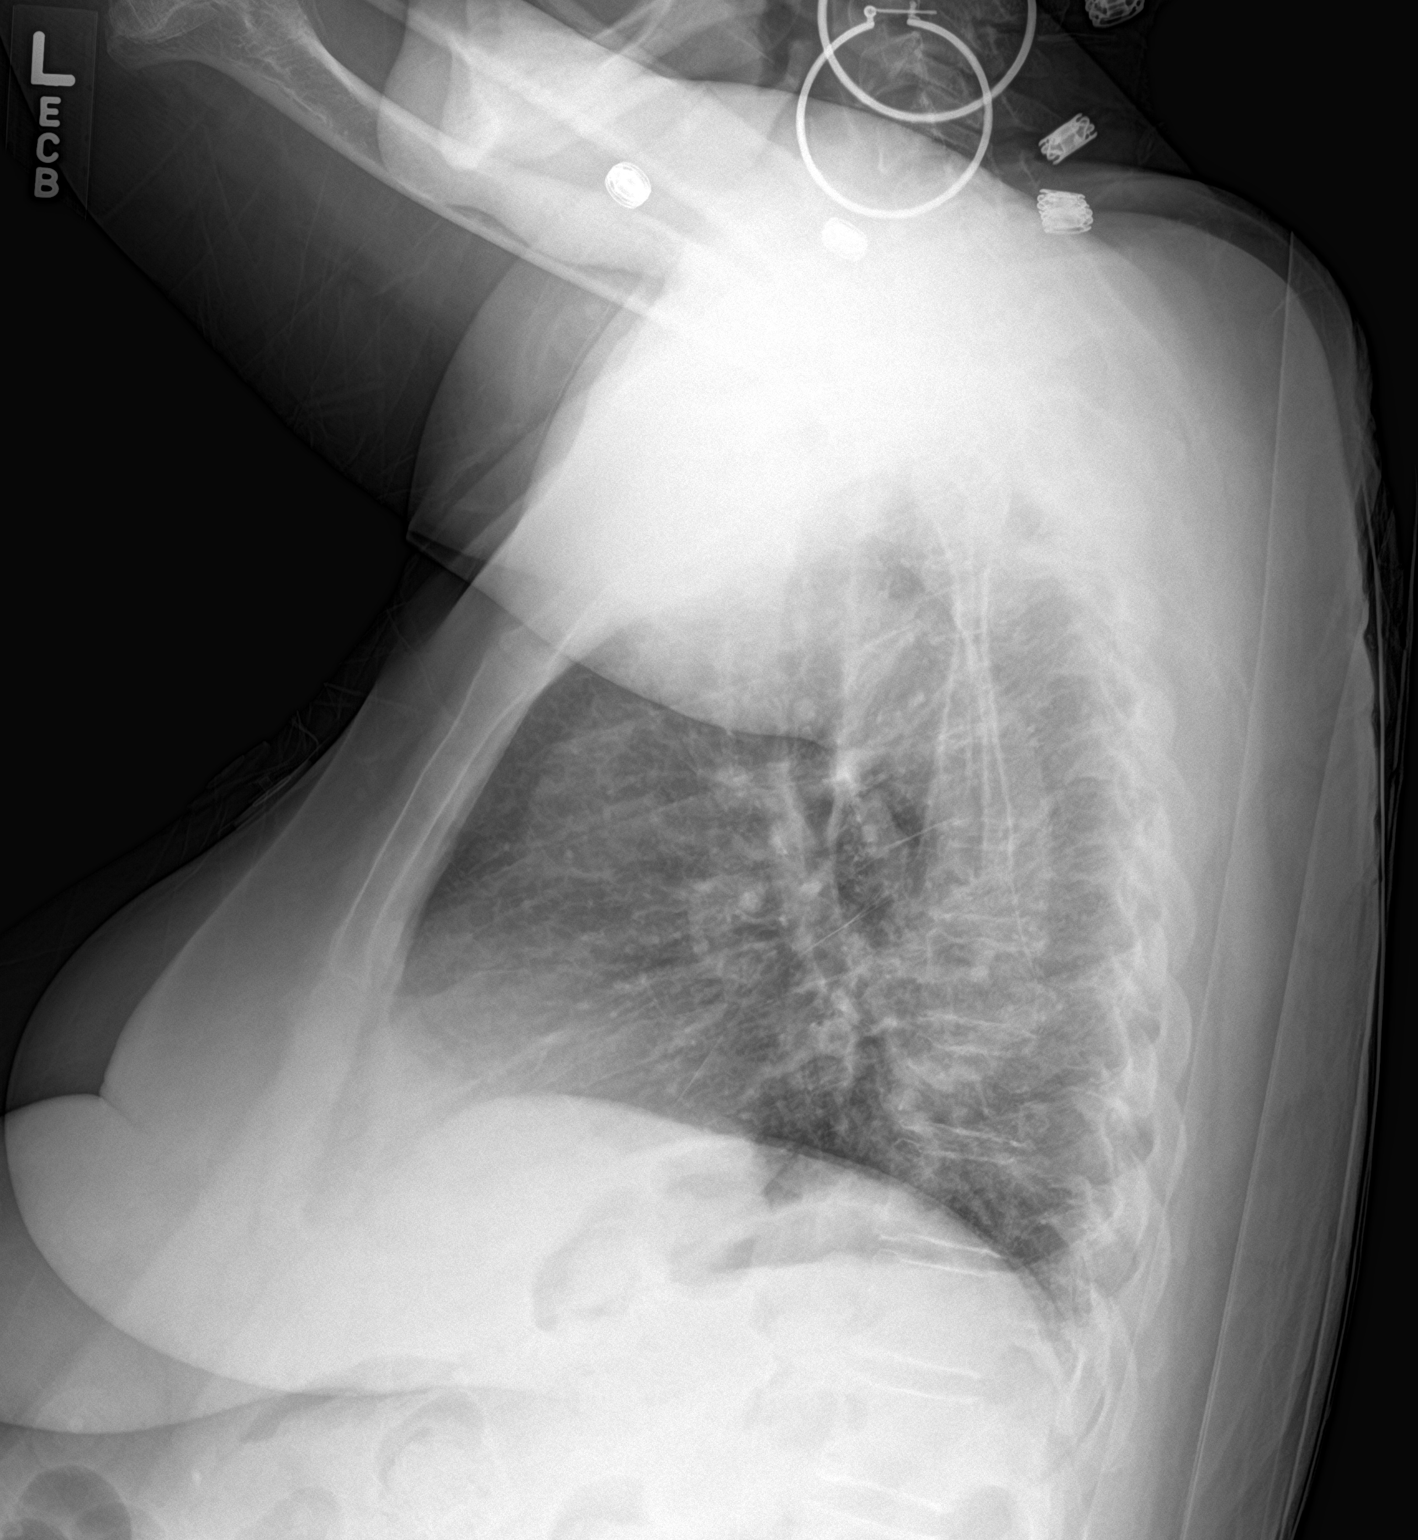

[chest ap]
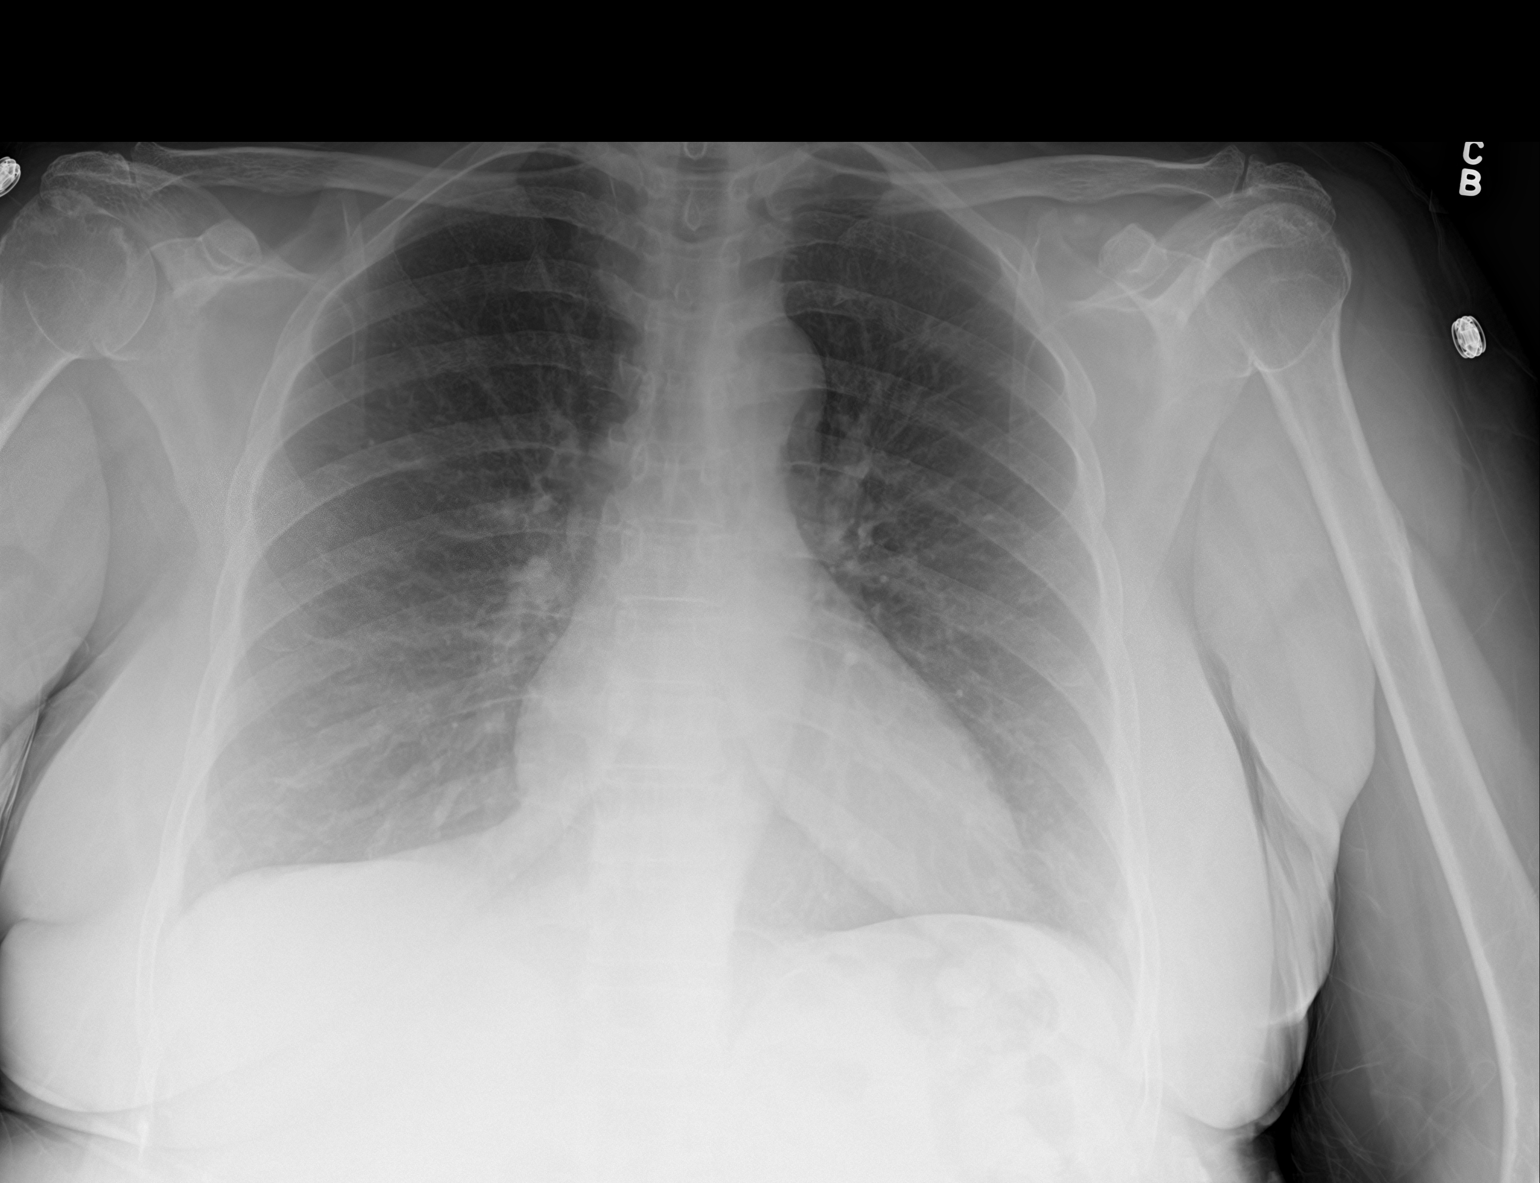

[2 of 2 positions shown; findings below may reference images not displayed]

FINDINGS: The heart size and mediastinal contours are normal. The lungs are
clear. There is no pleural effusion or pneumothorax. No acute
osseous findings are identified. There are degenerative changes at
both shoulders with possible mild narrowing of the subacromial
space.
IMPRESSION: No active cardiopulmonary process.

## 2016-11-18 ENCOUNTER — Ambulatory Visit (HOSPITAL_COMMUNITY): Payer: Medicare HMO | Admitting: Psychiatry

## 2016-11-27 ENCOUNTER — Ambulatory Visit (INDEPENDENT_AMBULATORY_CARE_PROVIDER_SITE_OTHER): Payer: Medicare HMO | Admitting: Psychiatry

## 2016-11-27 ENCOUNTER — Encounter (HOSPITAL_COMMUNITY): Payer: Self-pay | Admitting: Psychiatry

## 2016-11-27 VITALS — BP 130/76 | HR 91 | Ht 61.0 in | Wt 174.0 lb

## 2016-11-27 DIAGNOSIS — F3160 Bipolar disorder, current episode mixed, unspecified: Secondary | ICD-10-CM | POA: Diagnosis not present

## 2016-11-27 DIAGNOSIS — F17210 Nicotine dependence, cigarettes, uncomplicated: Secondary | ICD-10-CM | POA: Diagnosis not present

## 2016-11-27 MED ORDER — DIVALPROEX SODIUM ER 250 MG PO TB24: ORAL_TABLET | ORAL | 1 refills | Status: DC

## 2016-11-27 NOTE — Progress Notes (Signed)
5 view Patient ID: Vanessa Proctor, female   DOB: Mar 25, 1956, 61 y.o.   MRN: [id number]  Psychiatric Initial Adult Assessment   Patient Identification: Vanessa Proctor MRN:  [id number] Date of Evaluation:  11/27/2016 Referral Source: Self-referred Chief Complaint: Weight loss  Visit Diagnosis: Bipolar disorder, type 1 No diagnosis found.  History of Present Illness:   Today the patient is seen alone. The patient is doing fairly well. She had a period where she felt depressed in July short-lived. He seeing you related a lot feeling lonely. She denies any problems with sleep or appetite. She's got good energy. She denies the use of alcohol or drugs. She has no psychosis. The patient would like to live on her and is looking forward to. Her hepatitis C is well-controlled. Medically she is stable. She is no specific psychosocial stressors at this time.  PTSD Symptoms:   Past Psychiatric History: Psychiatric hospitalization at [facility] in 1999.  Previous Psychotropic Medications: Yes   Substance Abuse History in the last 12 months:  Yes.    Consequences of Substance Abuse:   Past Medical History:  Past Medical History:  Diagnosis Date  . Arthritis    knees  . Constipation last month  . Coronary artery disease   . Depression   . Drug abuse   . Hypertension   . Myocardial infarction [facility])    november 1st 2012  . Nausea & vomiting last month  . Rectal bleeding   . Stroke [facility])    november, 2012  . Tobacco abuse     Past Surgical History:  Procedure Laterality Date  . ABDOMINAL HYSTERECTOMY  1985   partial  . cataract surgery Left   . COLONOSCOPY N/A 04/28/2013   Procedure: COLONOSCOPY;  Surgeon: Beryle Beams, MD;  Location: [facility] ENDOSCOPY;  Service: Endoscopy;  Laterality: N/A;  . CORONARY ANGIOPLASTY WITH STENT PLACEMENT  2012   x 1  . FINGER FRACTURE SURGERY Left    reduction with  splint  . knot drained and removed from side of face  2007  .  TONSILLECTOMY  1980    Family Psychiatric History:   Family History:  Family History  Problem Relation Age of Onset  . Cancer Mother        mass in abdomin  . Coronary artery disease Unknown   . Pancreatic cancer Sister   . Stroke Brother   . Heart attack Maternal Grandfather   . Hypertension Maternal Grandfather     Social History:   Social History   Social History  . Marital status: Single    Spouse name: N/A  . Number of children: N/A  . Years of education: N/A   Social History Main Topics  . Smoking status: Current Every Day Smoker    Packs/day: 0.25    Years: 40.00    Types: Cigarettes  . Smokeless tobacco: Never Used     Comment: trying to quit today 02/05/14  . Alcohol use No     Comment: quit drugs 2011  marjuana  . Drug use: Yes     Comment: marijuana occass  . Sexual activity: Not Currently   Other Topics Concern  . None   Social History Narrative  . None    Additional Social History:   Allergies:   Allergies  Allergen Reactions  . Bactrim Rash  . Sulfa Antibiotics Rash    Metabolic Disorder Labs: Lab Results  Component Value Date   HGBA1C 6.0 (H) 08/24/2016  MPG 134 03/04/2015   MPG 128 (H) 03/09/2011   No results found for: PROLACTIN Lab Results  Component Value Date   CHOL 130 02/04/2016   TRIG 178 (H) 02/04/2016   HDL 45 (L) 02/04/2016   CHOLHDL 2.9 02/04/2016   VLDL 36 (H) 02/04/2016   LDLCALC 49 02/04/2016   LDLCALC 58 03/04/2015     Current Medications: Current Outpatient Prescriptions  Medication Sig Dispense Refill  . albuterol (PROVENTIL HFA;VENTOLIN HFA) 108 (90 BASE) MCG/ACT inhaler Inhale 2 puffs into the lungs every 6 (six) hours as needed for wheezing or shortness of breath. 1 Inhaler 2  . amLODipine (NORVASC) 10 MG tablet Take 1 tablet (10 mg total) by mouth daily. 90 tablet 3  . aspirin 81 MG chewable tablet Chew 1 tablet (81 mg total) by mouth daily after lunch. 90 tablet 3  . atorvastatin (LIPITOR) 80 MG  tablet Take 0.5 tablets (40 mg total) by mouth daily. 45 tablet 3  . carbamide peroxide (DEBROX) 6.5 % otic solution Place 5 drops into the right ear 2 (two) times daily. 15 mL 0  . clopidogrel (PLAVIX) 75 MG tablet Take 1 tablet (75 mg total) by mouth daily. At lunch 90 tablet 3  . divalproex (DEPAKOTE ER) 250 MG 24 hr tablet 1  qam  For 1 week then   2  qam 180 tablet 1  . Elastic Bandages & Supports (MEDICAL COMPRESSION STOCKINGS) MISC 1 Package by Does not apply route daily. Dispense 1 pair, to fit, wear daily, take off at night 1 each 0  . furosemide (LASIX) 20 MG tablet Take 1 tablet (20 mg total) by mouth daily. 10 tablet 0  . ibuprofen (ADVIL,MOTRIN) 400 MG tablet Take 1 tablet (400 mg total) by mouth every 6 (six) hours as needed. 30 tablet 0  . Ledipasvir-Sofosbuvir (HARVONI) 90-400 MG TABS Take 1 tablet by mouth daily. 28 tablet 2  . losartan (COZAAR) 100 MG tablet Take 1 tablet (100 mg total) by mouth daily. 90 tablet 3  . metFORMIN (GLUCOPHAGE) 500 MG tablet Take 1 tablet (500 mg total) by mouth daily with breakfast. 90 tablet 3  . metoprolol tartrate (LOPRESSOR) 25 MG tablet Take 1 tablet (25 mg total) by mouth 2 (two) times daily. 180 tablet 3  . neomycin-polymyxin-hydrocortisone (CORTISPORIN) 3.5-10000-1 otic suspension Place 4 drops into the right ear 3 (three) times daily. 10 mL 0  . nitroGLYCERIN (NITROSTAT) 0.4 MG SL tablet Place 1 tablet (0.4 mg total) under the tongue every 5 (five) minutes as needed for chest pain. (Patient not taking: Reported on 08/24/2016) 90 tablet 3  . Vitamin D, Ergocalciferol, (DRISDOL) 50000 units CAPS capsule Take 1 capsule (50,000 Units total) by mouth every 7 (seven) days. (Patient not taking: Reported on 08/24/2016) 12 capsule 0   No current facility-administered medications for this visit.     Neurologic: Headache: No Seizure: No Paresthesias:No  Musculoskeletal: Strength & Muscle Tone: within normal limits Gait & Station: normal Patient  leans  Psychiatric Specialty Exam: ROS  Blood pressure 130/76, pulse 91, height 5\' 1"  (1.549 m), weight 174 lb (78.9 kg).Body mass index is 32.88 kg/m.  General Appearance: Casual  Eye Contact:  Good  Speech:  Clear and Coherent  Volume:  Normal  Mood:  Negative  Affect:  Appropriate  Thought Process:  Coherent  Orientation:  Full (Time, Place, and Person)  Thought Content:  WDL  Suicidal Thoughts:  No  Homicidal Thoughts:  No  Memory:  Negative  Judgement:  Good  Insight:  Good  Psychomotor Activity:  Normal  Concentration:  Good  Recall:  Good  Fund of Knowledge:Good  Language: Fair  Akathisia:  No  Handed:  Right  AIMS (if indicated):    Assets:  Desire for Improvement  ADL's:  Intact  Cognition: WNL  Sleep:     Treatment Plan Summary: 8/17/201810:55 AM  At this time the patient continue taking Depakote as prescribed. She is not in therapy at this time. She'll come back to see me in 3 months we'll have a more closer evaluation while she is on Depakote and how long taking. Patient certainly is not suicidal. She denies chest pain or shortness of breath or any neurological symptoms. Generally she is quite stable. A #1 problem is bipolar disorder seems to be well-controlled

## 2016-12-11 ENCOUNTER — Ambulatory Visit: Payer: Medicare HMO | Attending: Family Medicine | Admitting: Family Medicine

## 2016-12-11 ENCOUNTER — Encounter: Payer: Self-pay | Admitting: Family Medicine

## 2016-12-11 VITALS — BP 99/67 | HR 86 | Temp 98.6°F | Ht 61.0 in | Wt 174.2 lb

## 2016-12-11 DIAGNOSIS — Z8673 Personal history of transient ischemic attack (TIA), and cerebral infarction without residual deficits: Secondary | ICD-10-CM | POA: Diagnosis not present

## 2016-12-11 DIAGNOSIS — Z7982 Long term (current) use of aspirin: Secondary | ICD-10-CM | POA: Insufficient documentation

## 2016-12-11 DIAGNOSIS — E7800 Pure hypercholesterolemia, unspecified: Secondary | ICD-10-CM | POA: Diagnosis not present

## 2016-12-11 DIAGNOSIS — I252 Old myocardial infarction: Secondary | ICD-10-CM | POA: Insufficient documentation

## 2016-12-11 DIAGNOSIS — Z90710 Acquired absence of both cervix and uterus: Secondary | ICD-10-CM | POA: Diagnosis not present

## 2016-12-11 DIAGNOSIS — R7303 Prediabetes: Secondary | ICD-10-CM | POA: Diagnosis not present

## 2016-12-11 DIAGNOSIS — B182 Chronic viral hepatitis C: Secondary | ICD-10-CM | POA: Diagnosis not present

## 2016-12-11 DIAGNOSIS — F319 Bipolar disorder, unspecified: Secondary | ICD-10-CM | POA: Diagnosis not present

## 2016-12-11 DIAGNOSIS — I2510 Atherosclerotic heart disease of native coronary artery without angina pectoris: Secondary | ICD-10-CM | POA: Insufficient documentation

## 2016-12-11 DIAGNOSIS — Z79899 Other long term (current) drug therapy: Secondary | ICD-10-CM | POA: Diagnosis not present

## 2016-12-11 DIAGNOSIS — Z9889 Other specified postprocedural states: Secondary | ICD-10-CM | POA: Insufficient documentation

## 2016-12-11 DIAGNOSIS — Z23 Encounter for immunization: Secondary | ICD-10-CM | POA: Diagnosis not present

## 2016-12-11 DIAGNOSIS — Z7984 Long term (current) use of oral hypoglycemic drugs: Secondary | ICD-10-CM | POA: Diagnosis not present

## 2016-12-11 DIAGNOSIS — I10 Essential (primary) hypertension: Secondary | ICD-10-CM | POA: Diagnosis not present

## 2016-12-11 MED ORDER — AMLODIPINE BESYLATE 5 MG PO TABS: 5.0000 mg | ORAL_TABLET | Freq: Every day | ORAL | 3 refills | Status: DC

## 2016-12-11 NOTE — Progress Notes (Signed)
Subjective:  Patient ID: Vanessa Proctor, female    DOB: 1955/09/20  Age: 61 y.o. MRN: [id number]  CC: Hypertension   HPI Vanessa Proctor is a 61 year old female with a history of hepatitis C (completed the treatment with her on the; viral load from 10/12/16-undetectable), hypertension, previous CVA, bipolar disorder, prediabetes with A1c of 6.0) who presents today to establish care with me. She was previously followed by Dr. Janne Napoleon.  She has been on metformin for prediabetes and complains of reduced appetite and decreased energy with metformin I would like to come off it.  Her blood pressure is on the soft side today and she has noticed this same thing at home but denies lightheadedness.  For bipolar disorder and she is followed at [facility].  She endorses not exercising regularly and eats anything she likes.  Past Medical History:  Diagnosis Date  . Arthritis    knees  . Constipation last month  . Coronary artery disease   . Depression   . Drug abuse   . Hypertension   . Myocardial infarction [facility])    november 1st 2012  . Nausea & vomiting last month  . Rectal bleeding   . Stroke [facility])    november, 2012  . Tobacco abuse     Past Surgical History:  Procedure Laterality Date  . ABDOMINAL HYSTERECTOMY  1985   partial  . cataract surgery Left   . COLONOSCOPY N/A 04/28/2013   Procedure: COLONOSCOPY;  Surgeon: Beryle Beams, MD;  Location: [facility];  Service: Endoscopy;  Laterality: N/A;  . CORONARY ANGIOPLASTY WITH STENT PLACEMENT  2012   x 1  . FINGER FRACTURE SURGERY Left    reduction with  splint  . knot drained and removed from side of face  2007  . TONSILLECTOMY  1980    Allergies  Allergen Reactions  . Bactrim Rash  . Sulfa Antibiotics Rash     Outpatient Medications Prior to Visit  Medication Sig Dispense Refill  . albuterol (PROVENTIL HFA;VENTOLIN HFA) 108 (90 BASE) MCG/ACT inhaler Inhale 2 puffs into the lungs  every 6 (six) hours as needed for wheezing or shortness of breath. 1 Inhaler 2  . aspirin 81 MG chewable tablet Chew 1 tablet (81 mg total) by mouth daily after lunch. 90 tablet 3  . atorvastatin (LIPITOR) 80 MG tablet Take 0.5 tablets (40 mg total) by mouth daily. 45 tablet 3  . carbamide peroxide (DEBROX) 6.5 % otic solution Place 5 drops into the right ear 2 (two) times daily. 15 mL 0  . clopidogrel (PLAVIX) 75 MG tablet Take 1 tablet (75 mg total) by mouth daily. At lunch 90 tablet 3  . divalproex (DEPAKOTE ER) 250 MG 24 hr tablet 1  qam  For 1 week then   2  qam 180 tablet 1  . Elastic Bandages & Supports (MEDICAL COMPRESSION STOCKINGS) MISC 1 Package by Does not apply route daily. Dispense 1 pair, to fit, wear daily, take off at night 1 each 0  . ibuprofen (ADVIL,MOTRIN) 400 MG tablet Take 1 tablet (400 mg total) by mouth every 6 (six) hours as needed. 30 tablet 0  . losartan (COZAAR) 100 MG tablet Take 1 tablet (100 mg total) by mouth daily. 90 tablet 3  . metFORMIN (GLUCOPHAGE) 500 MG tablet Take 1 tablet (500 mg total) by mouth daily with breakfast. 90 tablet 3  . metoprolol tartrate (LOPRESSOR) 25 MG tablet Take 1 tablet (25 mg total) by mouth 2 (  two) times daily. 180 tablet 3  . neomycin-polymyxin-hydrocortisone (CORTISPORIN) 3.5-10000-1 otic suspension Place 4 drops into the right ear 3 (three) times daily. 10 mL 0  . nitroGLYCERIN (NITROSTAT) 0.4 MG SL tablet Place 1 tablet (0.4 mg total) under the tongue every 5 (five) minutes as needed for chest pain. 90 tablet 3  . Vitamin D, Ergocalciferol, (DRISDOL) 50000 units CAPS capsule Take 1 capsule (50,000 Units total) by mouth every 7 (seven) days. 12 capsule 0  . amLODipine (NORVASC) 10 MG tablet Take 1 tablet (10 mg total) by mouth daily. 90 tablet 3  . furosemide (LASIX) 20 MG tablet Take 1 tablet (20 mg total) by mouth daily. (Patient not taking: Reported on 12/11/2016) 10 tablet 0  . Ledipasvir-Sofosbuvir (HARVONI) 90-400 MG TABS Take 1  tablet by mouth daily. (Patient not taking: Reported on 12/11/2016) 28 tablet 2   No facility-administered medications prior to visit.     ROS Review of Systems  Constitutional: Positive for activity change and appetite change. Negative for fatigue.  HENT: Negative for congestion, sinus pressure and sore throat.   Eyes: Negative for visual disturbance.  Respiratory: Negative for cough, chest tightness, shortness of breath and wheezing.   Cardiovascular: Negative for chest pain and palpitations.  Gastrointestinal: Negative for abdominal distention, abdominal pain and constipation.  Endocrine: Negative for polydipsia.  Genitourinary: Negative for dysuria and frequency.  Musculoskeletal: Negative for arthralgias and back pain.  Skin: Negative for rash.  Neurological: Negative for tremors, light-headedness and numbness.  Hematological: Does not bruise/bleed easily.  Psychiatric/Behavioral: Negative for agitation and behavioral problems.    Objective:  BP 99/67   Pulse 86   Temp 98.6 F (37 C) (Oral)   Ht 5\' 1"  (1.549 m)   Wt 174 lb 3.2 oz (79 kg)   SpO2 98%   BMI 32.91 kg/m   BP/Weight 12/11/2016 08/24/2016 16/04/930  Systolic BP 99 355 732  Diastolic BP 67 77 84  Wt. (Lbs) 174.2 179 165.5  BMI 32.91 33.82 31.27  Some encounter information is confidential and restricted. Go to Review Flowsheets activity to see all data.      Physical Exam  Constitutional: She is oriented to person, place, and time. She appears well-developed and well-nourished. No distress.  HENT:  Head: Normocephalic.  Right Ear: External ear normal.  Left Ear: External ear normal.  Nose: Nose normal.  Mouth/Throat: Oropharynx is clear and moist.  Eyes: Pupils are equal, round, and reactive to light. Conjunctivae and EOM are normal.  Neck: Normal range of motion. No JVD present.  Cardiovascular: Normal rate, regular rhythm, normal heart sounds and intact distal pulses.  Exam reveals no gallop.   No  murmur heard. Pulmonary/Chest: Effort normal and breath sounds normal. No respiratory distress. She has no wheezes. She has no rales. She exhibits no tenderness.  Abdominal: Soft. Bowel sounds are normal. She exhibits no distension and no mass. There is no tenderness.  Musculoskeletal: Normal range of motion. She exhibits no edema or tenderness.  Neurological: She is alert and oriented to person, place, and time. She has normal reflexes.  Skin: Skin is warm and dry. She is not diaphoretic.  Psychiatric: She has a normal mood and affect.     Assessment & Plan:   1. HYPERTENSION, BENIGN ESSENTIAL Controlled on blood pressure is on the soft side Decrease dose of amlodipine - amLODipine (NORVASC) 5 MG tablet; Take 1 tablet (5 mg total) by mouth daily.  Dispense: 30 tablet; Refill: 3  2. Chronic hepatitis  C without hepatic coma ([place]) Completed course of treatment with harvoni Viral load from 10/12/16-undetectable  3. Bipolar I disorder ([place]) Continue Depakote Keep appointment with psychiatry  4. Pure hypercholesterolemia Controlled Continue atorvastatin Low cholesterol diet  5. Need for influenza vaccination Flu vaccine administered  6. Prediabetes Controlled with A1c of 6.0 Discontinue metformin due to intolerance Discussed the need to comply with lifestyle modifications, diabetic diet to prevent development of diabetes.   Meds ordered this encounter  Medications  . amLODipine (NORVASC) 5 MG tablet    Sig: Take 1 tablet (5 mg total) by mouth daily.    Dispense:  30 tablet    Refill:  3    Follow-up: Return in about 3 months (around 03/12/2017) for Follow-up on hypertension.   Arnoldo Morale MD

## 2016-12-11 NOTE — Patient Instructions (Signed)
Decrease amlodipine to 5mg daily.

## 2017-02-24 ENCOUNTER — Encounter (HOSPITAL_COMMUNITY): Payer: Self-pay | Admitting: Psychiatry

## 2017-02-24 ENCOUNTER — Ambulatory Visit (INDEPENDENT_AMBULATORY_CARE_PROVIDER_SITE_OTHER): Payer: Medicare HMO | Admitting: Psychiatry

## 2017-02-24 VITALS — BP 122/88 | HR 82 | Ht 61.0 in | Wt 175.0 lb

## 2017-02-24 DIAGNOSIS — F17210 Nicotine dependence, cigarettes, uncomplicated: Secondary | ICD-10-CM | POA: Diagnosis not present

## 2017-02-24 DIAGNOSIS — F3160 Bipolar disorder, current episode mixed, unspecified: Secondary | ICD-10-CM | POA: Diagnosis not present

## 2017-02-24 DIAGNOSIS — R454 Irritability and anger: Secondary | ICD-10-CM

## 2017-02-24 MED ORDER — DIVALPROEX SODIUM ER 250 MG PO TB24: ORAL_TABLET | ORAL | 5 refills | Status: DC

## 2017-02-24 NOTE — Progress Notes (Signed)
5 view Patient ID: YOUNG BRIM, adult   DOB: 12-03-1955, 61 y.o.   MRN: [id number]  Psychiatric Initial Adult Assessment   Patient Identification: Vanessa Proctor MRN:  [id number] Date of Evaluation:  02/24/2017 Referral Source: Self-referred Chief Complaint: Weight loss  Visit Diagnosis: Bipolar disorder, type 1   ICD-10-CM   1. Bipolar I disorder, most recent episode mixed ([place]) F31.60 Valproic acid level    History of Present Illness:   This patient is a poor historian. Patient says she's had multiple episodes in her life which she was states of euphoria. She is not clear about what other associated symptoms she had. So the focus on just the last year in March she developed persistent state of irritability. During that time she seemed to be more hyperactive she had racing thinking which she says she still has this. The patient does not have a decreased need for sleep. She doesn't really seen that distractible.At this time she does nothing dangerous risky in March but she's had past times where she did risky things. Hard to tell exactly if they were distinct manic episodes. He's been taking Depakote for a few months doesn't feel any difference. Her last Depakote level taking 500 mg was approximately 54. The patient today says she still has racing thinking. Her mood seems to be normal in my opinion. I think collateral information is extremely important in this patient. PTSD Symptoms:   Past Psychiatric History: Psychiatric hospitalization at [facility] in 1999.  Previous Psychotropic Medications: Yes   Substance Abuse History in the last 12 months:  Yes.    Consequences of Substance Abuse:   Past Medical History:  Past Medical History:  Diagnosis Date  . Arthritis    knees  . Constipation last month  . Coronary artery disease   . Depression   . Drug abuse ([place])   . Hypertension   . Myocardial infarction [facility])    november 1st 2012  . Nausea & vomiting last  month  . Rectal bleeding   . Stroke [facility])    november, 2012  . Tobacco abuse     Past Surgical History:  Procedure Laterality Date  . ABDOMINAL HYSTERECTOMY  1985   partial  . cataract surgery Left   . CORONARY ANGIOPLASTY WITH STENT PLACEMENT  2012   x 1  . FINGER FRACTURE SURGERY Left    reduction with  splint  . knot drained and removed from side of face  2007  . TONSILLECTOMY  1980    Family Psychiatric History:   Family History:  Family History  Problem Relation Age of Onset  . Cancer Mother        mass in abdomin  . Coronary artery disease Unknown   . Pancreatic cancer Sister   . Stroke Brother   . Heart attack Maternal Grandfather   . Hypertension Maternal Grandfather     Social History:   Social History   Socioeconomic History  . Marital status: Single    Spouse name: None  . Number of children: None  . Years of education: None  . Highest education level: None  Social Needs  . Financial resource strain: None  . Food insecurity - worry: None  . Food insecurity - inability: None  . Transportation needs - medical: None  . Transportation needs - non-medical: None  Occupational History  . None  Tobacco Use  . Smoking status: Current Every Day Smoker    Packs/day: 0.25    Years:  40.00    Pack years: 10.00    Types: Cigarettes  . Smokeless tobacco: Never Used  . Tobacco comment: trying to quit today 02/05/14  Substance and Sexual Activity  . Alcohol use: No    Alcohol/week: 0.0 oz    Comment: quit drugs 2011  marjuana  . Drug use: Yes    Comment: marijuana occass  . Sexual activity: Not Currently  Other Topics Concern  . None  Social History Narrative  . None    Additional Social History:   Allergies:   Allergies  Allergen Reactions  . Bactrim Rash  . Sulfa Antibiotics Rash    Metabolic Disorder Labs: Lab Results  Component Value Date   HGBA1C 6.0 (H) 08/24/2016   MPG 134 03/04/2015   MPG 128 (H) 03/09/2011   No results found  for: PROLACTIN Lab Results  Component Value Date   CHOL 130 02/04/2016   TRIG 178 (H) 02/04/2016   HDL 45 (L) 02/04/2016   CHOLHDL 2.9 02/04/2016   VLDL 36 (H) 02/04/2016   LDLCALC 49 02/04/2016   LDLCALC 58 03/04/2015     Current Medications: Current Outpatient Medications  Medication Sig Dispense Refill  . amLODipine (NORVASC) 5 MG tablet Take 1 tablet (5 mg total) by mouth daily. 30 tablet 3  . aspirin 81 MG chewable tablet Chew 1 tablet (81 mg total) by mouth daily after lunch. 90 tablet 3  . atorvastatin (LIPITOR) 80 MG tablet Take 0.5 tablets (40 mg total) by mouth daily. 45 tablet 3  . carbamide peroxide (DEBROX) 6.5 % otic solution Place 5 drops into the right ear 2 (two) times daily. 15 mL 0  . clopidogrel (PLAVIX) 75 MG tablet Take 1 tablet (75 mg total) by mouth daily. At lunch 90 tablet 3  . divalproex (DEPAKOTE ER) 250 MG 24 hr tablet 3  qam 90 tablet 5  . Elastic Bandages & Supports (MEDICAL COMPRESSION STOCKINGS) MISC 1 Package by Does not apply route daily. Dispense 1 pair, to fit, wear daily, take off at night 1 each 0  . losartan (COZAAR) 100 MG tablet Take 1 tablet (100 mg total) by mouth daily. 90 tablet 3  . metoprolol tartrate (LOPRESSOR) 25 MG tablet Take 1 tablet (25 mg total) by mouth 2 (two) times daily. 180 tablet 3  . nitroGLYCERIN (NITROSTAT) 0.4 MG SL tablet Place 1 tablet (0.4 mg total) under the tongue every 5 (five) minutes as needed for chest pain. 90 tablet 3  . Vitamin D, Ergocalciferol, (DRISDOL) 50000 units CAPS capsule Take 1 capsule (50,000 Units total) by mouth every 7 (seven) days. 12 capsule 0  . albuterol (PROVENTIL HFA;VENTOLIN HFA) 108 (90 BASE) MCG/ACT inhaler Inhale 2 puffs into the lungs every 6 (six) hours as needed for wheezing or shortness of breath. (Patient not taking: Reported on 02/24/2017) 1 Inhaler 2  . furosemide (LASIX) 20 MG tablet Take 1 tablet (20 mg total) by mouth daily. (Patient not taking: Reported on 12/11/2016) 10 tablet  0  . ibuprofen (ADVIL,MOTRIN) 400 MG tablet Take 1 tablet (400 mg total) by mouth every 6 (six) hours as needed. (Patient not taking: Reported on 02/24/2017) 30 tablet 0  . Ledipasvir-Sofosbuvir (HARVONI) 90-400 MG TABS Take 1 tablet by mouth daily. (Patient not taking: Reported on 12/11/2016) 28 tablet 2  . metFORMIN (GLUCOPHAGE) 500 MG tablet Take 1 tablet (500 mg total) by mouth daily with breakfast. (Patient not taking: Reported on 02/24/2017) 90 tablet 3  . neomycin-polymyxin-hydrocortisone (CORTISPORIN) 3.5-10000-1 otic suspension Place  4 drops into the right ear 3 (three) times daily. (Patient not taking: Reported on 02/24/2017) 10 mL 0   No current facility-administered medications for this visit.     Neurologic: Headache: No Seizure: No Paresthesias:No  Musculoskeletal: Strength & Muscle Tone: within normal limits Gait & Station: normal Patient leans  Psychiatric Specialty Exam: ROS  Blood pressure 122/88, pulse 82, height 5\' 1"  (1.549 m), weight 175 lb (79.4 kg).Body mass index is 33.07 kg/m.  General Appearance: Casual  Eye Contact:  Good  Speech:  Clear and Coherent  Volume:  Normal  Mood:  Negative  Affect:  Appropriate  Thought Process:  Coherent  Orientation:  Full (Time, Place, and Person)  Thought Content:  WDL  Suicidal Thoughts:  No  Homicidal Thoughts:  No  Memory:  Negative  Judgement:  Good  Insight:  Good  Psychomotor Activity:  Normal  Concentration:  Good  Recall:  Good  Fund of Knowledge:Good  Language: Fair  Akathisia:  No  Handed:  Right  AIMS (if indicated):    Assets:  Desire for Improvement  ADL's:  Intact  Cognition: WNL  Sleep:     Treatment Plan Summary: 11/14/20184:03 PM  At this time the patient will increase her Depakote to taking 750 mg and that in one week get a Depakote level. We asked her to return in 6-7 weeks we'll continue this diagnostic assessment. When she returns she'll bring her older brother. Clearly this time  she's not manic this time she's not psychotic she's not using drugs or alcohol. Generally she is quite stable at this time.

## 2017-03-10 ENCOUNTER — Ambulatory Visit: Payer: Medicare HMO | Attending: Family Medicine | Admitting: Family Medicine

## 2017-03-10 ENCOUNTER — Other Ambulatory Visit: Payer: Self-pay

## 2017-03-10 ENCOUNTER — Encounter: Payer: Self-pay | Admitting: Family Medicine

## 2017-03-10 VITALS — BP 130/80 | HR 81 | Temp 97.7°F | Ht 61.0 in | Wt 175.4 lb

## 2017-03-10 DIAGNOSIS — F17200 Nicotine dependence, unspecified, uncomplicated: Secondary | ICD-10-CM | POA: Insufficient documentation

## 2017-03-10 DIAGNOSIS — Z7982 Long term (current) use of aspirin: Secondary | ICD-10-CM | POA: Diagnosis not present

## 2017-03-10 DIAGNOSIS — Z955 Presence of coronary angioplasty implant and graft: Secondary | ICD-10-CM | POA: Insufficient documentation

## 2017-03-10 DIAGNOSIS — M179 Osteoarthritis of knee, unspecified: Secondary | ICD-10-CM | POA: Insufficient documentation

## 2017-03-10 DIAGNOSIS — Z79899 Other long term (current) drug therapy: Secondary | ICD-10-CM | POA: Insufficient documentation

## 2017-03-10 DIAGNOSIS — I2510 Atherosclerotic heart disease of native coronary artery without angina pectoris: Secondary | ICD-10-CM | POA: Diagnosis not present

## 2017-03-10 DIAGNOSIS — Z8673 Personal history of transient ischemic attack (TIA), and cerebral infarction without residual deficits: Secondary | ICD-10-CM | POA: Diagnosis not present

## 2017-03-10 DIAGNOSIS — R0789 Other chest pain: Secondary | ICD-10-CM | POA: Diagnosis not present

## 2017-03-10 DIAGNOSIS — I10 Essential (primary) hypertension: Secondary | ICD-10-CM | POA: Insufficient documentation

## 2017-03-10 DIAGNOSIS — Z90710 Acquired absence of both cervix and uterus: Secondary | ICD-10-CM | POA: Insufficient documentation

## 2017-03-10 DIAGNOSIS — Z8619 Personal history of other infectious and parasitic diseases: Secondary | ICD-10-CM | POA: Diagnosis not present

## 2017-03-10 DIAGNOSIS — R7303 Prediabetes: Secondary | ICD-10-CM | POA: Insufficient documentation

## 2017-03-10 DIAGNOSIS — Z882 Allergy status to sulfonamides status: Secondary | ICD-10-CM | POA: Diagnosis not present

## 2017-03-10 DIAGNOSIS — Z7902 Long term (current) use of antithrombotics/antiplatelets: Secondary | ICD-10-CM | POA: Insufficient documentation

## 2017-03-10 DIAGNOSIS — I252 Old myocardial infarction: Secondary | ICD-10-CM | POA: Insufficient documentation

## 2017-03-10 DIAGNOSIS — F319 Bipolar disorder, unspecified: Secondary | ICD-10-CM | POA: Diagnosis not present

## 2017-03-10 DIAGNOSIS — Z720 Tobacco use: Secondary | ICD-10-CM | POA: Diagnosis not present

## 2017-03-10 LAB — POCT GLYCOSYLATED HEMOGLOBIN (HGB A1C): Hemoglobin A1C: 6

## 2017-03-10 MED ORDER — AMLODIPINE BESYLATE 5 MG PO TABS: 5.0000 mg | ORAL_TABLET | Freq: Every day | ORAL | 1 refills | Status: DC

## 2017-03-10 MED ORDER — CLOPIDOGREL BISULFATE 75 MG PO TABS: 75.0000 mg | ORAL_TABLET | Freq: Every day | ORAL | 1 refills | Status: DC

## 2017-03-10 MED ORDER — ATORVASTATIN CALCIUM 40 MG PO TABS: 40.0000 mg | ORAL_TABLET | Freq: Every day | ORAL | 1 refills | Status: DC

## 2017-03-10 MED ORDER — METOPROLOL TARTRATE 25 MG PO TABS: 25.0000 mg | ORAL_TABLET | Freq: Two times a day (BID) | ORAL | 1 refills | Status: DC

## 2017-03-10 MED ORDER — LOSARTAN POTASSIUM 100 MG PO TABS: 100.0000 mg | ORAL_TABLET | Freq: Every day | ORAL | 1 refills | Status: DC

## 2017-03-10 MED ORDER — ALBUTEROL SULFATE HFA 108 (90 BASE) MCG/ACT IN AERS: 2.0000 | INHALATION_SPRAY | Freq: Four times a day (QID) | RESPIRATORY_TRACT | 1 refills | Status: DC | PRN

## 2017-03-10 NOTE — Patient Instructions (Signed)
Prediabetes Eating Plan Prediabetes-also called impaired glucose tolerance or impaired fasting glucose-is a condition that causes blood sugar (blood glucose) levels to be higher than normal. Following a healthy diet can help to keep prediabetes under control. It can also help to lower the risk of type 2 diabetes and heart disease, which are increased in people who have prediabetes. Along with regular exercise, a healthy diet:  Promotes weight loss.  Helps to control blood sugar levels.  Helps to improve the way that the body uses insulin.  What do I need to know about this eating plan?  Use the glycemic index (GI) to plan your meals. The index tells you how quickly a food will raise your blood sugar. Choose low-GI foods. These foods take a longer time to raise blood sugar.  Pay close attention to the amount of carbohydrates in the food that you eat. Carbohydrates increase blood sugar levels.  Keep track of how many calories you take in. Eating the right amount of calories will help you to achieve a healthy weight. Losing about 7 percent of your starting weight can help to prevent type 2 diabetes.  You may want to follow a Mediterranean diet. This diet includes a lot of vegetables, lean meats or fish, whole grains, fruits, and healthy oils and fats. What foods can I eat? Grains Whole grains, such as whole-wheat or whole-grain breads, crackers, cereals, and pasta. Unsweetened oatmeal. Bulgur. Barley. Quinoa. Brown rice. Corn or whole-wheat flour tortillas or taco shells. Vegetables Lettuce. Spinach. Peas. Beets. Cauliflower. Cabbage. Broccoli. Carrots. Tomatoes. Squash. Eggplant. Herbs. Peppers. Onions. Cucumbers. Brussels sprouts. Fruits Berries. Bananas. Apples. Oranges. Grapes. Papaya. Mango. Pomegranate. Kiwi. Grapefruit. Cherries. Meats and Other Protein Sources Seafood. Lean meats, such as chicken and turkey or lean cuts of pork and beef. Tofu. Eggs. Nuts. Beans. Dairy Low-fat or  fat-free dairy products, such as yogurt, cottage cheese, and cheese. Beverages Water. Tea. Coffee. Sugar-free or diet soda. Seltzer water. Milk. Milk alternatives, such as soy or almond milk. Condiments Mustard. Relish. Low-fat, low-sugar ketchup. Low-fat, low-sugar barbecue sauce. Low-fat or fat-free mayonnaise. Sweets and Desserts Sugar-free or low-fat pudding. Sugar-free or low-fat ice cream and other frozen treats. Fats and Oils Avocado. Walnuts. Olive oil. The items listed above may not be a complete list of recommended foods or beverages. Contact your dietitian for more options. What foods are not recommended? Grains Refined white flour and flour products, such as bread, pasta, snack foods, and cereals. Beverages Sweetened drinks, such as sweet iced tea and soda. Sweets and Desserts Baked goods, such as cake, cupcakes, pastries, cookies, and cheesecake. The items listed above may not be a complete list of foods and beverages to avoid. Contact your dietitian for more information. This information is not intended to replace advice given to you by your health care provider. Make sure you discuss any questions you have with your health care provider. Document Released: 08/14/2014 Document Revised: 09/05/2015 Document Reviewed: 04/25/2014 Elsevier Interactive Patient Education  2017 Elsevier Inc.  

## 2017-03-11 NOTE — Progress Notes (Signed)
Subjective:  Patient ID: Vanessa Proctor, adult    DOB: [birth year]  Age: 61 y.o. MRN: [id number]  CC: Hypertension   HPI Vanessa Proctor is a 61 year old female with a history of hepatitis C (completed treatment with Harvoni viral load from 10/12/16-undetectable), hypertension, previous CVA, bipolar disorder, prediabetes with A1c of 6.0) who presents today for a follow-up visit.  Endorses compliance with her antihypertensive but denies adverse effects.  Also tolerating her statin and denies myalgias.  She did have an episode of chest pain at rest associated with nausea this morning which resolved with nitroglycerin; denies chest pain at this time. She continues to smoke and is not ready to quit at this time.  Regards to her prediabetes she has been on dietary control and not taking her Metformin and her A1c stable at 6.0 today. She would like a refill of albuterol inhaler as the house she lives in is cold due to the fact that they are yet to turn on the heat and this causes her to wheeze and cough.  She denies shortness of breath.  M[facility] health is managed at [facility]  Past Medical History:  Diagnosis Date  . Arthritis    knees  . Constipation last month  . Coronary artery disease   . Depression   . Drug abuse ([place])   . Hypertension   . Myocardial infarction [facility])    november 1st 2012  . Nausea & vomiting last month  . Rectal bleeding   . Stroke [facility])    november, 2012  . Tobacco abuse     Past Surgical History:  Procedure Laterality Date  . ABDOMINAL HYSTERECTOMY  1985   partial  . cataract surgery Left   . COLONOSCOPY N/A 04/28/2013   Procedure: COLONOSCOPY;  Surgeon: Beryle Beams, MD;  Location: [facility];  Service: Endoscopy;  Laterality: N/A;  . CORONARY ANGIOPLASTY WITH STENT PLACEMENT  2012   x 1  . FINGER FRACTURE SURGERY Left    reduction with  splint  . knot drained and removed from side of face  2007  . TONSILLECTOMY  1980    Allergies    Allergen Reactions  . Bactrim Rash  . Sulfa Antibiotics Rash     Outpatient Medications Prior to Visit  Medication Sig Dispense Refill  . aspirin 81 MG chewable tablet Chew 1 tablet (81 mg total) by mouth daily after lunch. 90 tablet 3  . divalproex (DEPAKOTE ER) 250 MG 24 hr tablet 3  qam 90 tablet 5  . Elastic Bandages & Supports (MEDICAL COMPRESSION STOCKINGS) MISC 1 Package by Does not apply route daily. Dispense 1 pair, to fit, wear daily, take off at night 1 each 0  . nitroGLYCERIN (NITROSTAT) 0.4 MG SL tablet Place 1 tablet (0.4 mg total) under the tongue every 5 (five) minutes as needed for chest pain. 90 tablet 3  . Vitamin D, Ergocalciferol, (DRISDOL) 50000 units CAPS capsule Take 1 capsule (50,000 Units total) by mouth every 7 (seven) days. 12 capsule 0  . amLODipine (NORVASC) 5 MG tablet Take 1 tablet (5 mg total) by mouth daily. 30 tablet 3  . atorvastatin (LIPITOR) 80 MG tablet Take 0.5 tablets (40 mg total) by mouth daily. 45 tablet 3  . clopidogrel (PLAVIX) 75 MG tablet Take 1 tablet (75 mg total) by mouth daily. At lunch 90 tablet 3  . losartan (COZAAR) 100 MG tablet Take 1 tablet (100 mg total) by mouth daily. 90 tablet 3  . metoprolol tartrate (  LOPRESSOR) 25 MG tablet Take 1 tablet (25 mg total) by mouth 2 (two) times daily. 180 tablet 3  . carbamide peroxide (DEBROX) 6.5 % otic solution Place 5 drops into the right ear 2 (two) times daily. (Patient not taking: Reported on 03/10/2017) 15 mL 0  . Ledipasvir-Sofosbuvir (HARVONI) 90-400 MG TABS Take 1 tablet by mouth daily. (Patient not taking: Reported on 12/11/2016) 28 tablet 2  . metFORMIN (GLUCOPHAGE) 500 MG tablet Take 1 tablet (500 mg total) by mouth daily with breakfast. (Patient not taking: Reported on 02/24/2017) 90 tablet 3  . albuterol (PROVENTIL HFA;VENTOLIN HFA) 108 (90 BASE) MCG/ACT inhaler Inhale 2 puffs into the lungs every 6 (six) hours as needed for wheezing or shortness of breath. (Patient not taking: Reported  on 02/24/2017) 1 Inhaler 2  . furosemide (LASIX) 20 MG tablet Take 1 tablet (20 mg total) by mouth daily. (Patient not taking: Reported on 12/11/2016) 10 tablet 0  . ibuprofen (ADVIL,MOTRIN) 400 MG tablet Take 1 tablet (400 mg total) by mouth every 6 (six) hours as needed. (Patient not taking: Reported on 02/24/2017) 30 tablet 0  . neomycin-polymyxin-hydrocortisone (CORTISPORIN) 3.5-10000-1 otic suspension Place 4 drops into the right ear 3 (three) times daily. (Patient not taking: Reported on 02/24/2017) 10 mL 0   No facility-administered medications prior to visit.     ROS Review of Systems  Constitutional: Negative for activity change, appetite change and fatigue.  HENT: Negative for congestion, sinus pressure and sore throat.   Eyes: Negative for visual disturbance.  Respiratory: Negative for cough, chest tightness, shortness of breath and wheezing.   Cardiovascular: Negative for chest pain and palpitations.  Gastrointestinal: Negative for abdominal distention, abdominal pain and constipation.  Endocrine: Negative for polydipsia.  Genitourinary: Negative for dysuria and frequency.  Musculoskeletal: Negative for arthralgias and back pain.  Skin: Negative for rash.  Neurological: Negative for tremors, light-headedness and numbness.  Hematological: Does not bruise/bleed easily.  Psychiatric/Behavioral: Negative for agitation and behavioral problems.    Objective:  BP 130/80   Pulse 81   Temp 97.7 F (36.5 C) (Oral)   Ht 5\' 1"  (1.549 m)   Wt 175 lb 6.4 oz (79.6 kg)   SpO2 100%   BMI 33.14 kg/m   BP/Weight 03/10/2017 12/11/2016 7/67/2094  Systolic BP 709 99 628  Diastolic BP 80 67 77  Wt. (Lbs) 175.4 174.2 179  BMI 33.14 32.91 33.82  Some encounter information is confidential and restricted. Go to Review Flowsheets activity to see all data.      Physical Exam  Constitutional: She is oriented to person, place, and time. She appears well-developed and well-nourished.    Cardiovascular: Normal rate, normal heart sounds and intact distal pulses.  No murmur heard. Pulmonary/Chest: Effort normal and breath sounds normal. She has no wheezes. She has no rales. She exhibits no tenderness.  Abdominal: Soft. Bowel sounds are normal. She exhibits no distension and no mass. There is no tenderness.  Musculoskeletal: Normal range of motion.  Neurological: She is alert and oriented to person, place, and time.  Skin: Skin is warm and dry.  Psychiatric: She has a normal mood and affect.     CMP Latest Ref Rng & Units 08/24/2016 05/26/2016 04/01/2016  Glucose 65 - 99 mg/dL 81 78 -  BUN 8 - 27 mg/dL 18 16 -  Creatinine 0.57 - 1.00 mg/dL 0.65 0.92 -  Sodium 134 - 144 mmol/L 140 141 -  Potassium 3.5 - 5.2 mmol/L 4.1 4.3 -  Chloride 96 -  106 mmol/L 102 108 -  CO2 18 - 29 mmol/L 24 27 -  Calcium 8.7 - 10.3 mg/dL 9.9 9.7 -  Total Protein 6.1 - 8.1 g/dL - 7.0 -  Total Bilirubin 0.2 - 1.2 mg/dL - 0.5 -  Alkaline Phos 33 - 130 U/L - 68 -  AST 10 - 35 U/L - 23 -  ALT 6 - 29 U/L - 16 33(H)    Lipid Panel     Component Value Date/Time   CHOL 130 02/04/2016 1220   TRIG 178 (H) 02/04/2016 1220   HDL 45 (L) 02/04/2016 1220   CHOLHDL 2.9 02/04/2016 1220   VLDL 36 (H) 02/04/2016 1220   LDLCALC 49 02/04/2016 1220    Lab Results  Component Value Date   HGBA1C 6.0 03/10/2017    Assessment & Plan:   1. HYPERTENSION, BENIGN ESSENTIAL Controlled Low-sodium diet, lifestyle modifications - amLODipine (NORVASC) 5 MG tablet; Take 1 tablet (5 mg total) by mouth daily.  Dispense: 90 tablet; Refill: 1 - losartan (COZAAR) 100 MG tablet; Take 1 tablet (100 mg total) by mouth daily.  Dispense: 90 tablet; Refill: 1 - metoprolol tartrate (LOPRESSOR) 25 MG tablet; Take 1 tablet (25 mg total) by mouth 2 (two) times daily.  Dispense: 180 tablet; Refill: 1 - Lipid panel; Future - Comprehensive metabolic panel; Future  2. Bipolar I disorder ([place]) Currently on Depakote Managed by  [facility]  3. Prediabetes A1c 6.0 Diabetic diet - POCT glycosylated hemoglobin (Hb A1C)  4. Other chest pain Absent at this time EKG is reassuring  5. Coronary artery disease involving native coronary artery of native heart without angina pectoris Has had chest pain in recent past Risk factor modification including smoking cessation of Compliant with statin and Plavix - atorvastatin (LIPITOR) 40 MG tablet; Take 1 tablet (40 mg total) by mouth daily.  Dispense: 90 tablet; Refill: 1 - clopidogrel (PLAVIX) 75 MG tablet; Take 1 tablet (75 mg total) by mouth daily. At lunch  Dispense: 90 tablet; Refill: 1  6. Tobacco abuse Smoking cessation support: smoking cessation hotline: 1-800-QUIT-NOW.  Smoking cessation classes are available through [facility]. Call [phone] or visit our website at [email or web address]  Spent 3 minutes counseling on smoking cessation and patient is not ready to quit.   7. History of stroke No residual deficits Risk factor modification Continue Plavix due to history of CAD and CVA   Meds ordered this encounter  Medications  . amLODipine (NORVASC) 5 MG tablet    Sig: Take 1 tablet (5 mg total) by mouth daily.    Dispense:  90 tablet    Refill:  1  . atorvastatin (LIPITOR) 40 MG tablet    Sig: Take 1 tablet (40 mg total) by mouth daily.    Dispense:  90 tablet    Refill:  1  . clopidogrel (PLAVIX) 75 MG tablet    Sig: Take 1 tablet (75 mg total) by mouth daily. At lunch    Dispense:  90 tablet    Refill:  1  . losartan (COZAAR) 100 MG tablet    Sig: Take 1 tablet (100 mg total) by mouth daily.    Dispense:  90 tablet    Refill:  1  . metoprolol tartrate (LOPRESSOR) 25 MG tablet    Sig: Take 1 tablet (25 mg total) by mouth 2 (two) times daily.    Dispense:  180 tablet    Refill:  1  . albuterol (PROVENTIL HFA;VENTOLIN HFA) 108 (90  Base) MCG/ACT inhaler    Sig: Inhale 2 puffs into the lungs every 6 (six) hours as needed  for wheezing or shortness of breath.    Dispense:  1 Inhaler    Refill:  1    Follow-up: Return in about 3 months (around 06/10/2017) for Follow-up of hypertension and prediabetes.   Arnoldo Morale MD

## 2017-03-17 ENCOUNTER — Ambulatory Visit: Payer: Medicare HMO | Attending: Family Medicine

## 2017-03-17 DIAGNOSIS — I10 Essential (primary) hypertension: Secondary | ICD-10-CM | POA: Diagnosis not present

## 2017-03-17 NOTE — Progress Notes (Signed)
Patient here for lab visit only 

## 2017-03-18 LAB — COMPREHENSIVE METABOLIC PANEL
ALT: 11 IU/L (ref 0–32)
AST: 19 IU/L (ref 0–40)
Albumin/Globulin Ratio: 1.2 (ref 1.2–2.2)
Albumin: 4.1 g/dL (ref 3.6–4.8)
Alkaline Phosphatase: 84 IU/L (ref 39–117)
BUN/Creatinine Ratio: 16 (ref 12–28)
BUN: 12 mg/dL (ref 8–27)
Bilirubin Total: 0.3 mg/dL (ref 0.0–1.2)
CO2: 26 mmol/L (ref 20–29)
Calcium: 10.3 mg/dL (ref 8.7–10.3)
Chloride: 103 mmol/L (ref 96–106)
Creatinine, Ser: 0.75 mg/dL (ref 0.57–1.00)
GFR calc Af Amer: 98 mL/min/{1.73_m2} (ref >59)
GFR calc non Af Amer: 85 mL/min/{1.73_m2} (ref >59)
Globulin, Total: 3.3 g/dL (ref 1.5–4.5)
Glucose: 90 mg/dL (ref 65–99)
Potassium: 4.1 mmol/L (ref 3.5–5.2)
Sodium: 143 mmol/L (ref 134–144)
Total Protein: 7.4 g/dL (ref 6.0–8.5)

## 2017-03-18 LAB — LIPID PANEL
Chol/HDL Ratio: 3.4 ratio (ref 0.0–4.4)
Cholesterol, Total: 124 mg/dL (ref 100–199)
HDL: 37 mg/dL — ABNORMAL LOW (ref >39)
LDL Calculated: 73 mg/dL (ref 0–99)
Triglycerides: 70 mg/dL (ref 0–149)
VLDL Cholesterol Cal: 14 mg/dL (ref 5–40)

## 2017-04-20 ENCOUNTER — Telehealth (HOSPITAL_COMMUNITY): Payer: Self-pay

## 2017-04-20 NOTE — Telephone Encounter (Signed)
Patient called and said that since increasing the Depakote to 3 a day she has felt off and out of sorts. Patient would like to go back to 2 a day where she felt more stable. Patient has an appointment with you in 2 weeks, is it okay for her to decrease to 2 a day until your appointment? Please advise, thank you

## 2017-04-21 ENCOUNTER — Ambulatory Visit (HOSPITAL_COMMUNITY): Payer: Self-pay | Admitting: Psychiatry

## 2017-04-21 NOTE — Telephone Encounter (Signed)
Per Dr. Casimiro Needle patient is going to come in tomorrow for a Depakote level.

## 2017-04-22 ENCOUNTER — Other Ambulatory Visit (HOSPITAL_COMMUNITY): Payer: Self-pay

## 2017-04-22 DIAGNOSIS — Z79899 Other long term (current) drug therapy: Secondary | ICD-10-CM

## 2017-04-24 LAB — VALPROIC ACID LEVEL: Valproic Acid Lvl: 53 ug/mL (ref 50–100)

## 2017-04-24 LAB — SPECIMEN STATUS REPORT

## 2017-04-26 ENCOUNTER — Other Ambulatory Visit: Payer: Self-pay | Admitting: Family Medicine

## 2017-05-06 ENCOUNTER — Encounter (HOSPITAL_COMMUNITY): Payer: Self-pay | Admitting: Psychiatry

## 2017-05-06 ENCOUNTER — Ambulatory Visit (INDEPENDENT_AMBULATORY_CARE_PROVIDER_SITE_OTHER): Payer: Medicare HMO | Admitting: Psychiatry

## 2017-05-06 VITALS — BP 124/76 | HR 91 | Ht 61.0 in | Wt 175.8 lb

## 2017-05-06 DIAGNOSIS — F17210 Nicotine dependence, cigarettes, uncomplicated: Secondary | ICD-10-CM

## 2017-05-06 DIAGNOSIS — F3162 Bipolar disorder, current episode mixed, moderate: Secondary | ICD-10-CM

## 2017-05-06 DIAGNOSIS — F319 Bipolar disorder, unspecified: Secondary | ICD-10-CM | POA: Diagnosis not present

## 2017-05-06 DIAGNOSIS — Z79899 Other long term (current) drug therapy: Secondary | ICD-10-CM

## 2017-05-06 MED ORDER — DIVALPROEX SODIUM ER 500 MG PO TB24: ORAL_TABLET | ORAL | 5 refills | Status: DC

## 2017-05-06 NOTE — Progress Notes (Signed)
5 view Patient ID: Vanessa Proctor, adult   DOB: Jul 06, 1955, 62 y.o.   MRN: [id number]  Psychiatric Initial Adult Assessment   Patient Identification: Vanessa Proctor MRN:  [id number] Date of Evaluation:  05/06/2017 Referral Source: Self-referred Chief Complaint: Weight loss  Visit Diagnosis: Bipolar disorder, type 1 No diagnosis found.  History of Present Illness:   PTSD Symptoms:  At this time he still seems somewhat vague. The patient was on 750 milligrams of Depakote and felt strange. She says she feels a lot more comfortable and 500. Her target symptoms seem to be racing thinking being hyperactive and feeling irritable and tense. Much of these symptoms are better but not gone on 500 mg of Depakote. At this time we'll continue this dose. Unfortunately her family is not committed not to come to talk with [place] at this center. I think collateral information is very important. For now the patient denies being suicidal. She denies being depressed. 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 He is very engaged she seems positive and she admits she has good energy. I don't think she's hyperactive at this time. She's very clear thinking. She was hospitalized [facility] 1999 because of depression suicidal but she's quickly says that she was drinking excessively at that time. She drinks no alcohol this time is no drugs. She's very compliant with her care. Presently she's not in therapy.  Past Psychiatric History: Psychiatric hospitalization at [facility] in 1999.  Previous Psychotropic Medications: Yes   Substance Abuse History in the last 12 months:  Yes.    Consequences of Substance Abuse:   Past Medical History:  Past Medical History:  Diagnosis Date  . Arthritis    knees  . Constipation last month  . Coronary artery disease   .  Depression   . Drug abuse ([place])   . Hypertension   . Myocardial infarction [facility])    november 1st 2012  . Nausea & vomiting last month  . Rectal bleeding   . Stroke [facility])    november, 2012  . Tobacco abuse     Past Surgical History:  Procedure Laterality Date  . ABDOMINAL HYSTERECTOMY  1985   partial  . cataract surgery Left   . COLONOSCOPY N/A 04/28/2013   Procedure: COLONOSCOPY;  Surgeon: Beryle Beams, MD;  Location: [facility];  Service: Endoscopy;  Laterality: N/A;  . CORONARY ANGIOPLASTY WITH STENT PLACEMENT  2012   x 1  . FINGER FRACTURE SURGERY Left    reduction with  splint  . knot drained and removed from side of face  2007  . TONSILLECTOMY  1980    Family Psychiatric History:   Family History:  Family History  Problem Relation Age of Onset  . Cancer Mother        mass in abdomin  . Coronary artery disease Unknown   . Pancreatic cancer Sister   . Stroke Brother   . Heart attack Maternal Grandfather   . Hypertension Maternal Grandfather     Social History:   Social History   Socioeconomic History  . Marital status: Single    Spouse name: None  . Number of children: None  . Years of education: None  . Highest education level: None  Social Needs  . Financial resource strain: None  . Food insecurity - worry: None  . Food insecurity - inability:  None  . Transportation needs - medical: None  . Transportation needs - non-medical: None  Occupational History  . None  Tobacco Use  . Smoking status: Current Every Day Smoker    Packs/day: 0.50    Years: 40.00    Pack years: 20.00    Types: Cigarettes  . Smokeless tobacco: Never Used  Substance and Sexual Activity  . Alcohol use: No    Alcohol/week: 0.0 oz  . Drug use: Yes    Comment: marijuana occassionaly  . Sexual activity: Not Currently  Other Topics Concern  . None  Social History Narrative  . None    Additional Social History:   Allergies:   Allergies  Allergen Reactions  . Bactrim  Rash  . Sulfa Antibiotics Rash    Metabolic Disorder Labs: Lab Results  Component Value Date   HGBA1C 6.0 03/10/2017   MPG 134 03/04/2015   MPG 128 (H) 03/09/2011   No results found for: PROLACTIN Lab Results  Component Value Date   CHOL 124 03/17/2017   TRIG 70 03/17/2017   HDL 37 (L) 03/17/2017   CHOLHDL 3.4 03/17/2017   VLDL 36 (H) 02/04/2016   LDLCALC 73 03/17/2017   LDLCALC 49 02/04/2016     Current Medications: Current Outpatient Medications  Medication Sig Dispense Refill  . amLODipine (NORVASC) 5 MG tablet Take 1 tablet (5 mg total) by mouth daily. 90 tablet 1  . aspirin 81 MG chewable tablet Chew 1 tablet (81 mg total) by mouth daily after lunch. 90 tablet 3  . atorvastatin (LIPITOR) 40 MG tablet Take 1 tablet (40 mg total) by mouth daily. 90 tablet 1  . clopidogrel (PLAVIX) 75 MG tablet Take 1 tablet (75 mg total) by mouth daily. At lunch 90 tablet 1  . divalproex (DEPAKOTE ER) 250 MG 24 hr tablet 3  qam 90 tablet 5  . losartan (COZAAR) 100 MG tablet Take 1 tablet (100 mg total) by mouth daily. 90 tablet 1  . metoprolol tartrate (LOPRESSOR) 25 MG tablet Take 1 tablet (25 mg total) by mouth 2 (two) times daily. 180 tablet 1  . nitroGLYCERIN (NITROSTAT) 0.4 MG SL tablet Place 1 tablet (0.4 mg total) under the tongue every 5 (five) minutes as needed for chest pain. 90 tablet 3  . VENTOLIN HFA 108 (90 Base) MCG/ACT inhaler INHALE 2 PUFFS INTO THE LUNGS EVERY 6 (SIX) HOURS AS NEEDED FOR WHEEZING OR SHORTNESS OF BREATH. 18 g 1  . Vitamin D, Ergocalciferol, (DRISDOL) 50000 units CAPS capsule Take 1 capsule (50,000 Units total) by mouth every 7 (seven) days. 12 capsule 0  . Elastic Bandages & Supports (MEDICAL COMPRESSION STOCKINGS) MISC 1 Package by Does not apply route daily. Dispense 1 pair, to fit, wear daily, take off at night 1 each 0   No current facility-administered medications for this visit.     Neurologic: Headache: No Seizure:  No Paresthesias:No  Musculoskeletal: Strength & Muscle Tone: within normal limits Gait & Station: normal Patient leans  Psychiatric Specialty Exam: ROS  Blood pressure 124/76, pulse 91, height 5\' 1"  (1.549 m), weight 175 lb 12.8 oz (79.7 kg).Body mass index is 33.22 kg/m.  General Appearance: Casual  Eye Contact:  Good  Speech:  Clear and Coherent  Volume:  Normal  Mood:  Negative  Affect:  Appropriate  Thought Process:  Coherent  Orientation:  Full (Time, Place, and Person)  Thought Content:  WDL  Suicidal Thoughts:  No  Homicidal Thoughts:  No  Memory:  Negative  Judgement:  Good  Insight:  Good  Psychomotor Activity:  Normal  Concentration:  Good  Recall:  Good  Fund of Knowledge:Good  Language: Fair  Akathisia:  No  Handed:  Right  AIMS (if indicated):    Assets:  Desire for Improvement  ADL's:  Intact  Cognition: WNL  Sleep:     Treatment Plan Summary: 1/24/20194:04 PM  At this time the patient will continue taking Depakote 500 mg. It is noted that she had a therapeutic below therapeutic level with this dose. Nonetheless she has no side effects and seems to benefit from it. She is very willing to continue taking and return to see me in 4 or 5 months and again we invited her family come with her. We will try to clarify her target symptoms of manic episodes. At this time seems to be more like hypomania and at this time she's not really distressed by me. She's mainly upset because she was a sponsor over Christmas party for her family where number members that close to her did not show up. This is distressed her. But she by no means is manic at this time. In fact I don't she showed any distinct mania since I've been working with her.

## 2017-05-10 ENCOUNTER — Ambulatory Visit
Admission: RE | Admit: 2017-05-10 | Discharge: 2017-05-10 | Disposition: A | Discharge status: Home / Self Care | Payer: Medicare HMO | Source: Ambulatory Visit | Attending: Family Medicine | Admitting: Family Medicine

## 2017-05-10 DIAGNOSIS — R921 Mammographic calcification found on diagnostic imaging of breast: Secondary | ICD-10-CM

## 2017-05-31 ENCOUNTER — Encounter (HOSPITAL_COMMUNITY): Payer: Self-pay

## 2017-06-09 ENCOUNTER — Ambulatory Visit: Payer: Medicare HMO | Attending: Family Medicine | Admitting: Family Medicine

## 2017-06-09 ENCOUNTER — Encounter: Payer: Self-pay | Admitting: Family Medicine

## 2017-06-09 VITALS — BP 157/74 | HR 73 | Temp 97.8°F | Ht 61.0 in | Wt 176.0 lb

## 2017-06-09 DIAGNOSIS — E7800 Pure hypercholesterolemia, unspecified: Secondary | ICD-10-CM | POA: Insufficient documentation

## 2017-06-09 DIAGNOSIS — Z7982 Long term (current) use of aspirin: Secondary | ICD-10-CM | POA: Diagnosis not present

## 2017-06-09 DIAGNOSIS — Z8673 Personal history of transient ischemic attack (TIA), and cerebral infarction without residual deficits: Secondary | ICD-10-CM | POA: Diagnosis not present

## 2017-06-09 DIAGNOSIS — I2510 Atherosclerotic heart disease of native coronary artery without angina pectoris: Secondary | ICD-10-CM

## 2017-06-09 DIAGNOSIS — M25572 Pain in left ankle and joints of left foot: Secondary | ICD-10-CM

## 2017-06-09 DIAGNOSIS — G8929 Other chronic pain: Secondary | ICD-10-CM | POA: Diagnosis not present

## 2017-06-09 DIAGNOSIS — Z79899 Other long term (current) drug therapy: Secondary | ICD-10-CM | POA: Insufficient documentation

## 2017-06-09 DIAGNOSIS — I10 Essential (primary) hypertension: Secondary | ICD-10-CM | POA: Diagnosis not present

## 2017-06-09 DIAGNOSIS — Z882 Allergy status to sulfonamides status: Secondary | ICD-10-CM | POA: Diagnosis not present

## 2017-06-09 DIAGNOSIS — M25562 Pain in left knee: Secondary | ICD-10-CM | POA: Diagnosis not present

## 2017-06-09 DIAGNOSIS — Z955 Presence of coronary angioplasty implant and graft: Secondary | ICD-10-CM | POA: Diagnosis not present

## 2017-06-09 DIAGNOSIS — R7303 Prediabetes: Secondary | ICD-10-CM | POA: Diagnosis not present

## 2017-06-09 DIAGNOSIS — F319 Bipolar disorder, unspecified: Secondary | ICD-10-CM | POA: Insufficient documentation

## 2017-06-09 DIAGNOSIS — I252 Old myocardial infarction: Secondary | ICD-10-CM | POA: Insufficient documentation

## 2017-06-09 DIAGNOSIS — Z8619 Personal history of other infectious and parasitic diseases: Secondary | ICD-10-CM | POA: Insufficient documentation

## 2017-06-09 LAB — POCT GLYCOSYLATED HEMOGLOBIN (HGB A1C): Hemoglobin A1C: 6.1

## 2017-06-09 MED ORDER — MELOXICAM 7.5 MG PO TABS: 7.5000 mg | ORAL_TABLET | Freq: Every day | ORAL | 1 refills | Status: DC

## 2017-06-09 NOTE — Patient Instructions (Signed)

## 2017-06-09 NOTE — Progress Notes (Signed)
Subjective:  Patient ID: Vanessa Proctor, adult    DOB: 1956-02-20  Age: 62 y.o. MRN: [id number]  CC: Hypertension and Diabetes   HPI Vanessa Proctor  is a 62 year old female with a history of hepatitis C (completed treatment with Harvoni viral load from 10/12/16-undetectable), hypertension, previous CVA, bipolar disorder, prediabetes with A1c of 6.1) who presents today for a follow-up visit.  She complains of left knee and left ankle pain for the last 1 month associated with swelling, worse on getting up from a sitting position.  She saw her orthopedic last week and was prescribed physical therapy which she should be starting next week but was not given any pain medication. Denies history of preceding trauma and pain is rated as moderate.  Her blood pressure is elevated and she is unable to recall if she took antihypertensive this morning.  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 Her bipolar disorder is managed by mental health and she denies suicidal ideations or intent.  Past Medical History:  Diagnosis Date  . Arthritis    knees  . Constipation last month  . Coronary artery disease   . Depression   . Drug abuse ([place])   . Hypertension   . Myocardial infarction [facility])    november 1st 2012  . Nausea & vomiting last month  . Rectal bleeding   . Stroke [facility])    november, 2012  . Tobacco abuse     Past Surgical History:  Procedure Laterality Date  . ABDOMINAL HYSTERECTOMY  1985   partial  . cataract surgery Left   . COLONOSCOPY N/A 04/28/2013   Procedure: COLONOSCOPY;  Surgeon: Beryle Beams, MD;  [facility];  Service: Endoscopy;  Laterality: N/A;  . CORONARY ANGIOPLASTY WITH STENT PLACEMENT  2012   x 1  . FINGER FRACTURE SURGERY Left    reduction with  splint  . knot drained and removed from side of face  2007  . TONSILLECTOMY  1980    Allergies  Allergen Reactions  .  Bactrim Rash  . Sulfa Antibiotics Rash     Outpatient Medications Prior to Visit  Medication Sig Dispense Refill  . amLODipine (NORVASC) 5 MG tablet Take 1 tablet (5 mg total) by mouth daily. 90 tablet 1  . aspirin 81 MG chewable tablet Chew 1 tablet (81 mg total) by mouth daily after lunch. 90 tablet 3  . atorvastatin (LIPITOR) 40 MG tablet Take 1 tablet (40 mg total) by mouth daily. 90 tablet 1  . clopidogrel (PLAVIX) 75 MG tablet Take 1 tablet (75 mg total) by mouth daily. At lunch 90 tablet 1  . divalproex (DEPAKOTE ER) 500 MG 24 hr tablet 3  qam 30 tablet 5  . losartan (COZAAR) 100 MG tablet Take 1 tablet (100 mg total) by mouth daily. 90 tablet 1  . metoprolol tartrate (LOPRESSOR) 25 MG tablet Take 1 tablet (25 mg total) by mouth 2 (two) times daily. 180 tablet 1  . nitroGLYCERIN (NITROSTAT) 0.4 MG SL tablet Place 1 tablet (0.4 mg total) under the tongue every 5 (five) minutes as needed for chest pain. 90 tablet 3  . VENTOLIN HFA 108 (90 Base) MCG/ACT inhaler INHALE 2 PUFFS INTO THE LUNGS EVERY 6 (SIX) HOURS AS NEEDED FOR WHEEZING OR SHORTNESS OF BREATH. 18 g 1  . Vitamin D, Ergocalciferol, (DRISDOL) 50000 units CAPS capsule Take 1 capsule (50,000 Units total) by mouth every 7 (  seven) days. 12 capsule 0  . Elastic Bandages & Supports (MEDICAL COMPRESSION STOCKINGS) MISC 1 Package by Does not apply route daily. Dispense 1 pair, to fit, wear daily, take off at night (Patient not taking: Reported on 06/09/2017) 1 each 0   No facility-administered medications prior to visit.     ROS Review of Systems  Constitutional: Negative for activity change, appetite change and fatigue.  HENT: Negative for congestion, sinus pressure and sore throat.   Eyes: Negative for visual disturbance.  Respiratory: Negative for cough, chest tightness, shortness of breath and wheezing.   Cardiovascular: Negative for chest pain and palpitations.  Gastrointestinal: Negative for abdominal distention, abdominal  pain and constipation.  Endocrine: Negative for polydipsia.  Genitourinary: Negative for dysuria and frequency.  Musculoskeletal:       See hpi  Skin: Negative for rash.  Neurological: Negative for tremors, light-headedness and numbness.  Hematological: Does not bruise/bleed easily.  Psychiatric/Behavioral: Negative for agitation and behavioral problems.    Objective:  BP (!) 157/74   Pulse 73   Temp 97.8 F (36.6 C) (Oral)   Ht 5\' 1"  (1.549 m)   Wt 176 lb (79.8 kg)   SpO2 98%   BMI 33.25 kg/m   BP/Weight 06/09/2017 03/10/2017 10/21/6267  Systolic BP 485 462 99  Diastolic BP 74 80 67  Wt. (Lbs) 176 175.4 174.2  BMI 33.25 33.14 32.91  Some encounter information is confidential and restricted. Go to Review Flowsheets activity to see all data.      Physical Exam  Constitutional: She is oriented to person, place, and time. She appears well-developed and well-nourished.  Cardiovascular: Normal rate, normal heart sounds and intact distal pulses.  No murmur heard. Pulmonary/Chest: Effort normal and breath sounds normal. She has no wheezes. She has no rales. She exhibits no tenderness.  Abdominal: Soft. Bowel sounds are normal. She exhibits no distension and no mass. There is no tenderness.  Musculoskeletal: Normal range of motion. She exhibits edema (1+ edema in L knee and L ankle) and tenderness (slight tenderness on ROM of L knee on inversion, eversion of L ankle. R knee and R ankle are not tender, crepitus on R knee.).  Neurological: She is alert and oriented to person, place, and time.     CMP Latest Ref Rng & Units 03/17/2017 08/24/2016 05/26/2016  Glucose 65 - 99 mg/dL 90 81 78  BUN 8 - 27 mg/dL 12 18 16   Creatinine 0.57 - 1.00 mg/dL 0.75 0.65 0.92  Sodium 134 - 144 mmol/L 143 140 141  Potassium 3.5 - 5.2 mmol/L 4.1 4.1 4.3  Chloride 96 - 106 mmol/L 103 102 108  CO2 20 - 29 mmol/L 26 24 27   Calcium 8.7 - 10.3 mg/dL 10.3 9.9 9.7  Total Protein 6.0 - 8.5 g/dL 7.4 - 7.0    Total Bilirubin 0.0 - 1.2 mg/dL 0.3 - 0.5  Alkaline Phos 39 - 117 IU/L 84 - 68  AST 0 - 40 IU/L 19 - 23  ALT 0 - 32 IU/L 11 - 16    Lipid Panel     Component Value Date/Time   CHOL 124 03/17/2017 0840   TRIG 70 03/17/2017 0840   HDL 37 (L) 03/17/2017 0840   CHOLHDL 3.4 03/17/2017 0840   CHOLHDL 2.9 02/04/2016 1220   VLDL 36 (H) 02/04/2016 1220   LDLCALC 73 03/17/2017 0840    Lab Results  Component Value Date   HGBA1C 6.1 06/09/2017    Assessment & Plan:   1.  Prediabetes Diet controlled with A1c of 6.1 - POCT glycosylated hemoglobin (Hb A1C)  2. Coronary artery disease involving native coronary artery of native heart without angina pectoris S/p stent placement Risk factor modification Currently on Plavix due to previous history of strokes and CAD  3. Pure hypercholesterolemia Controlled Continue statin  4. HYPERTENSION, BENIGN ESSENTIAL Uncontrolled Could be secondary to missing dose of antihypertensive Counseled on blood pressure goal of less than 130/80, low-sodium, DASH diet, medication compliance, 150 minutes of moderate intensity exercise per week. Discussed medication compliance, adverse effects.   5. Chronic pain of left knee Advised to use knee brace Referred to physical therapy by her orthopedics - meloxicam (MOBIC) 7.5 MG tablet; Take 1 tablet (7.5 mg total) by mouth daily.  Dispense: 30 tablet; Refill: 1  6. Chronic pain of left ankle Commenced on NSAID-advised that this could raise her blood pressure and she has been advised to comply with her antihypertensive Referred to physical therapy by orthopedics - meloxicam (MOBIC) 7.5 MG tablet; Take 1 tablet (7.5 mg total) by mouth daily.  Dispense: 30 tablet; Refill: 1   Meds ordered this encounter  Medications  . meloxicam (MOBIC) 7.5 MG tablet    Sig: Take 1 tablet (7.5 mg total) by mouth daily.    Dispense:  30 tablet    Refill:  1    Follow-up: Return in about 3 months (around 09/06/2017) for  Follow-up of chronic medical conditions.   Charlott Rakes MD

## 2017-06-18 ENCOUNTER — Other Ambulatory Visit (HOSPITAL_COMMUNITY): Payer: Self-pay | Admitting: Psychiatry

## 2017-06-21 ENCOUNTER — Ambulatory Visit: Payer: Medicare HMO | Attending: Student

## 2017-06-21 ENCOUNTER — Other Ambulatory Visit: Payer: Self-pay

## 2017-06-21 DIAGNOSIS — R262 Difficulty in walking, not elsewhere classified: Secondary | ICD-10-CM | POA: Diagnosis present

## 2017-06-21 DIAGNOSIS — M25672 Stiffness of left ankle, not elsewhere classified: Secondary | ICD-10-CM | POA: Diagnosis present

## 2017-06-21 DIAGNOSIS — M79671 Pain in right foot: Secondary | ICD-10-CM | POA: Diagnosis not present

## 2017-06-21 DIAGNOSIS — M25572 Pain in left ankle and joints of left foot: Secondary | ICD-10-CM | POA: Diagnosis not present

## 2017-06-21 DIAGNOSIS — R293 Abnormal posture: Secondary | ICD-10-CM | POA: Diagnosis present

## 2017-06-21 NOTE — Patient Instructions (Signed)
Issued DF stretch with towel and invr/ever/ and toe scrunch   2-3x/day 100 reps scrunch , 20-30 in/ev   30-90 sec stretch
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
## 2017-06-28 ENCOUNTER — Ambulatory Visit: Payer: Medicare HMO

## 2017-06-28 DIAGNOSIS — M25672 Stiffness of left ankle, not elsewhere classified: Secondary | ICD-10-CM

## 2017-06-28 DIAGNOSIS — M25572 Pain in left ankle and joints of left foot: Secondary | ICD-10-CM

## 2017-06-28 DIAGNOSIS — R293 Abnormal posture: Secondary | ICD-10-CM

## 2017-06-28 DIAGNOSIS — R262 Difficulty in walking, not elsewhere classified: Secondary | ICD-10-CM

## 2017-06-28 NOTE — Patient Instructions (Addendum)
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
## 2017-07-07 ENCOUNTER — Ambulatory Visit: Payer: Medicare HMO

## 2017-07-07 DIAGNOSIS — R262 Difficulty in walking, not elsewhere classified: Secondary | ICD-10-CM

## 2017-07-07 DIAGNOSIS — M25572 Pain in left ankle and joints of left foot: Secondary | ICD-10-CM | POA: Diagnosis not present

## 2017-07-07 DIAGNOSIS — M25672 Stiffness of left ankle, not elsewhere classified: Secondary | ICD-10-CM

## 2017-07-07 DIAGNOSIS — R293 Abnormal posture: Secondary | ICD-10-CM

## 2017-07-07 NOTE — Patient Instructions (Signed)
Single leg balancing
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
## 2017-07-12 ENCOUNTER — Ambulatory Visit: Payer: Medicare HMO | Attending: Student

## 2017-07-12 DIAGNOSIS — R262 Difficulty in walking, not elsewhere classified: Secondary | ICD-10-CM | POA: Diagnosis present

## 2017-07-12 DIAGNOSIS — M25572 Pain in left ankle and joints of left foot: Secondary | ICD-10-CM | POA: Insufficient documentation

## 2017-07-12 DIAGNOSIS — R293 Abnormal posture: Secondary | ICD-10-CM | POA: Diagnosis present

## 2017-07-12 DIAGNOSIS — M25672 Stiffness of left ankle, not elsewhere classified: Secondary | ICD-10-CM
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
## 2017-07-20 ENCOUNTER — Ambulatory Visit: Payer: Medicare HMO

## 2017-07-26 ENCOUNTER — Ambulatory Visit: Payer: Medicare HMO

## 2017-07-26 DIAGNOSIS — R262 Difficulty in walking, not elsewhere classified: Secondary | ICD-10-CM

## 2017-07-26 DIAGNOSIS — M25572 Pain in left ankle and joints of left foot: Secondary | ICD-10-CM

## 2017-07-26 DIAGNOSIS — R293 Abnormal posture: Secondary | ICD-10-CM

## 2017-07-26 DIAGNOSIS — M25672 Stiffness of left ankle, not elsewhere classified: Secondary | ICD-10-CM
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
## 2017-07-29 ENCOUNTER — Other Ambulatory Visit: Payer: Self-pay | Admitting: Family Medicine

## 2017-07-29 DIAGNOSIS — I10 Essential (primary) hypertension: Secondary | ICD-10-CM

## 2017-07-29 DIAGNOSIS — I2510 Atherosclerotic heart disease of native coronary artery without angina pectoris: Secondary | ICD-10-CM

## 2017-08-30 ENCOUNTER — Other Ambulatory Visit: Payer: Self-pay | Admitting: Family Medicine

## 2017-08-30 DIAGNOSIS — I10 Essential (primary) hypertension: Secondary | ICD-10-CM

## 2017-08-30 DIAGNOSIS — I2510 Atherosclerotic heart disease of native coronary artery without angina pectoris: Secondary | ICD-10-CM

## 2017-09-08 ENCOUNTER — Ambulatory Visit: Payer: Self-pay | Admitting: Family Medicine

## 2017-09-08 ENCOUNTER — Other Ambulatory Visit: Payer: Self-pay

## 2017-09-08 ENCOUNTER — Telehealth: Payer: Self-pay | Admitting: Family Medicine

## 2017-09-08 DIAGNOSIS — M25562 Pain in left knee: Principal | ICD-10-CM

## 2017-09-08 DIAGNOSIS — G8929 Other chronic pain: Secondary | ICD-10-CM

## 2017-09-08 DIAGNOSIS — M25572 Pain in left ankle and joints of left foot: Secondary | ICD-10-CM

## 2017-09-08 MED ORDER — MELOXICAM 7.5 MG PO TABS
7.5000 mg | ORAL_TABLET | Freq: Every day | ORAL | 1 refills | Status: DC
Start: 2017-09-08 — End: 2017-11-01

## 2017-09-08 NOTE — Telephone Encounter (Signed)
Patient came in stating she needs a refill for meloxicam (MOBIC) 7.5 MG tablet She has an appointment on 11/01/17 @ 11:10 Please Follow Up  [phone]

## 2017-09-08 NOTE — Telephone Encounter (Signed)
Refilled and sent to [facility] delivery.

## 2017-09-16 ENCOUNTER — Other Ambulatory Visit: Payer: Self-pay | Admitting: Family Medicine

## 2017-09-16 DIAGNOSIS — R921 Mammographic calcification found on diagnostic imaging of breast: Secondary | ICD-10-CM

## 2017-10-04 ENCOUNTER — Other Ambulatory Visit: Payer: Self-pay | Admitting: Family Medicine

## 2017-10-04 DIAGNOSIS — I10 Essential (primary) hypertension: Secondary | ICD-10-CM

## 2017-10-04 DIAGNOSIS — I2510 Atherosclerotic heart disease of native coronary artery without angina pectoris: Secondary | ICD-10-CM

## 2017-10-05 NOTE — Telephone Encounter (Signed)
Patient last seen 07/30/17. Next follow up appointment 11/01/17

## 2017-10-13 ENCOUNTER — Encounter (HOSPITAL_COMMUNITY): Payer: Self-pay | Admitting: Psychiatry

## 2017-10-13 ENCOUNTER — Ambulatory Visit (HOSPITAL_COMMUNITY): Payer: Medicare HMO | Admitting: Psychiatry

## 2017-10-13 VITALS — BP 102/62 | HR 85 | Ht 61.0 in | Wt 172.4 lb

## 2017-10-13 DIAGNOSIS — F3162 Bipolar disorder, current episode mixed, moderate: Secondary | ICD-10-CM

## 2017-10-13 MED ORDER — DIVALPROEX SODIUM ER 500 MG PO TB24: ORAL_TABLET | ORAL | 5 refills | Status: DC

## 2017-10-13 NOTE — Progress Notes (Signed)
5 view Patient ID: Vanessa Proctor, adult   DOB: 1955/07/03, 62 y.o.   MRN: [id number]  Psychiatric Initial Adult Assessment   Patient Identification: Vanessa Proctor MRN:  [id number] Date of Evaluation:  10/13/2017 Referral Source: Self-referred Chief Complaint: Weight loss  Visit Diagnosis: Bipolar disorder, type 1 No diagnosis found.  History of Present Illness:   PTSD Symptoms:  Today we reviewed this patient's chart. These days the patient is doing well. She is very short episodes where she feels depressed or irritable but they're very rare and very short period she is unable describe distinct clear episode of mania. We will back to her initi before I saw her she did seem to have a very good description of mania that to a fall injury. Her symptoms were classic for mania she is unable to reproduce her symptoms at this time. Once again we asked her to bring her family members to get some collateral information once again they don't seem to be available. The patient denies persistent daily. She denies persistent irritability or euphoria. She sleeping and eating well. She does not have racing thinking this time. She denies irritability. She says she's upset with her grandchildren because she thinks is her at times. Patient likes being by herself. She is no evidence of psychosis. She smokes marijuana rarely. She does not alcohol. She's not suicidal. She takes Depakote 500 mg and claims that seems to helpbut is not able to good description of whatexactly does. Patient says Depakote helps her be more focused. This of course is not the reason tha Her mood and general seems to be somewhat stable. Past Psychiatric History: Psychiatric hospitalization at [facility] in 1999.  Previous Psychotropic Medications: Yes   Substance Abuse History in the last 12 months:  Yes.    Consequences of Substance Abuse:   Past Medical History:  Past Medical History:  Diagnosis Date  . Arthritis    knees  . Constipation last month  . Coronary artery disease   . Depression   . Drug abuse ([place])   . Hypertension   . Myocardial infarction [facility])    november 1st 2012  . Nausea & vomiting last month  . Rectal bleeding   . Stroke [facility])    november, 2012  . Tobacco abuse     Past Surgical History:  Procedure Laterality Date  . ABDOMINAL HYSTERECTOMY  1985   partial  . cataract surgery Left   . COLONOSCOPY N/A 04/28/2013   Procedure: COLONOSCOPY;  Surgeon: Beryle Beams, MD;  [facility];  Service: Endoscopy;  Laterality: N/A;  . CORONARY ANGIOPLASTY WITH STENT PLACEMENT  2012   x 1  . FINGER FRACTURE SURGERY Left    reduction with  splint  . knot drained and removed from side of face  2007  . TONSILLECTOMY  1980    Family Psychiatric History:   Family History:  Family History  Problem Relation Age of Onset  . Cancer Mother        mass in abdomen  . Coronary artery disease Unknown   . Pancreatic cancer Sister   . Stroke Brother   . Heart attack Maternal Grandfather   . Hypertension Maternal Grandfather     Social History:   Social History   Socioeconomic History  . Marital status: Single    Spouse name: Not on file  . Number of children: Not on file  . Years of education: Not on file  . Highest education level: Not  on file  Occupational History  . Not on file  Social Needs  . Financial resource strain: Not on file  . Food insecurity:    Worry: Not on file    Inability: Not on file  . Transportation needs:    Medical: Not on file    Non-medical: Not on file  Tobacco Use  . Smoking status: Current Every Day Smoker    Packs/day: 0.50    Years: 40.00    Pack years: 20.00    Types: Cigarettes  . Smokeless tobacco: Never Used  Substance and Sexual Activity  . Alcohol use: No    Alcohol/week: 0.0 oz  . Drug use: Yes    Comment: marijuana occassionaly  . Sexual activity: Not Currently  Lifestyle  . Physical activity:    Days per week: Not on  file    Minutes per session: Not on file  . Stress: Not on file  Relationships  . Social connections:    Talks on phone: Not on file    Gets together: Not on file    Attends religious service: Not on file    Active member of club or organization: Not on file    Attends meetings of clubs or organizations: Not on file    Relationship status: Not on file  Other Topics Concern  . Not on file  Social History Narrative  . Not on file    Additional Social History:   Allergies:   Allergies  Allergen Reactions  . Bactrim Rash  . Sulfa Antibiotics Rash    Metabolic Disorder Labs: Lab Results  Component Value Date   HGBA1C 6.1 06/09/2017   MPG 134 03/04/2015   MPG 128 (H) 03/09/2011   No results found for: PROLACTIN Lab Results  Component Value Date   CHOL 124 03/17/2017   TRIG 70 03/17/2017   HDL 37 (L) 03/17/2017   CHOLHDL 3.4 03/17/2017   VLDL 36 (H) 02/04/2016   LDLCALC 73 03/17/2017   LDLCALC 49 02/04/2016     Current Medications: Current Outpatient Medications  Medication Sig Dispense Refill  . amLODipine (NORVASC) 5 MG tablet TAKE 1 TABLET EVERY DAY 90 tablet 0  . aspirin 81 MG chewable tablet Chew 1 tablet (81 mg total) by mouth daily after lunch. 90 tablet 3  . atorvastatin (LIPITOR) 40 MG tablet TAKE 1 TABLET EVERY DAY 90 tablet 0  . clopidogrel (PLAVIX) 75 MG tablet TAKE 1 TABLET (75 MG TOTAL) BY MOUTH DAILY  AT LUNCH 90 tablet 1  . divalproex (DEPAKOTE ER) 500 MG 24 hr tablet 3  qam 30 tablet 5  . Elastic Bandages & Supports (MEDICAL COMPRESSION STOCKINGS) MISC 1 Package by Does not apply route daily. Dispense 1 pair, to fit, wear daily, take off at night 1 each 0  . losartan (COZAAR) 100 MG tablet TAKE 1 TABLET EVERY DAY 90 tablet 1  . meloxicam (MOBIC) 7.5 MG tablet Take 1 tablet (7.5 mg total) by mouth daily. 30 tablet 1  . metoprolol tartrate (LOPRESSOR) 25 MG tablet TAKE 1 TABLET (25 MG TOTAL) BY MOUTH 2 (TWO) TIMES DAILY. 180 tablet 1  . nitroGLYCERIN  (NITROSTAT) 0.4 MG SL tablet Place 1 tablet (0.4 mg total) under the tongue every 5 (five) minutes as needed for chest pain. 90 tablet 3  . VENTOLIN HFA 108 (90 Base) MCG/ACT inhaler INHALE 2 PUFFS INTO THE LUNGS EVERY 6 (SIX) HOURS AS NEEDED FOR WHEEZING OR SHORTNESS OF BREATH. 18 g 1  . Vitamin D, Ergocalciferol, (  DRISDOL) 50000 units CAPS capsule Take 1 capsule (50,000 Units total) by mouth every 7 (seven) days. 12 capsule 0   No current facility-administered medications for this visit.     Neurologic: Headache: No Seizure: No Paresthesias:No  Musculoskeletal: Strength & Muscle Tone: within normal limits Gait & Station: normal Patient leans  Psychiatric Specialty Exam: ROS  Blood pressure 102/62, pulse 85, height 5\' 1"  (1.549 m), weight 172 lb 6.4 oz (78.2 kg), SpO2 96 %.Body mass index is 32.57 kg/m.  General Appearance: Casual  Eye Contact:  Good  Speech:  Clear and Coherent  Volume:  Normal  Mood:  Negative  Affect:  Appropriate  Thought Process:  Coherent  Orientation:  Full (Time, Place, and Person)  Thought Content:  WDL  Suicidal Thoughts:  No  Homicidal Thoughts:  No  Memory:  Negative  Judgement:  Good  Insight:  Good  Psychomotor Activity:  Normal  Concentration:  Good  Recall:  Good  Fund of Knowledge:Good  Language: Fair  Akathisia:  No  Handed:  Right  AIMS (if indicated):    Assets:  Desire for Improvement  ADL's:  Intact  Cognition: WNL  Sleep:     Treatment Plan Summary: 7/3/20193:06 PM   given that in our first meeting in 2017 that she described a very clear episode of mania and hesitant to stop her Depakote. I would consider stopping it if in fact she was having problems with which she's not. I would consider stopping it if her family would come with herand clarify that she's is stable. She is not interested in stopping it. She wants to continue it. For now we will continue it and she'll come back to see me in 4 months. It is my hope that she  should found with her. If she doesn't bring her family. We'll go ahead and get blood work just to check her liver function. His heart determine if she should stop taking Depakote or not. Certainly in the last year she shows no evidence of mania.

## 2017-11-01 ENCOUNTER — Encounter

## 2017-11-01 ENCOUNTER — Ambulatory Visit: Payer: Medicare HMO | Attending: Family Medicine | Admitting: Family Medicine

## 2017-11-01 ENCOUNTER — Encounter: Payer: Self-pay | Admitting: Family Medicine

## 2017-11-01 VITALS — BP 126/77 | HR 79 | Temp 98.1°F | Ht 61.0 in | Wt 172.6 lb

## 2017-11-01 DIAGNOSIS — Z7902 Long term (current) use of antithrombotics/antiplatelets: Secondary | ICD-10-CM | POA: Diagnosis not present

## 2017-11-01 DIAGNOSIS — Z7982 Long term (current) use of aspirin: Secondary | ICD-10-CM | POA: Diagnosis not present

## 2017-11-01 DIAGNOSIS — M25562 Pain in left knee: Secondary | ICD-10-CM | POA: Diagnosis not present

## 2017-11-01 DIAGNOSIS — D1723 Benign lipomatous neoplasm of skin and subcutaneous tissue of right leg: Secondary | ICD-10-CM

## 2017-11-01 DIAGNOSIS — Z8673 Personal history of transient ischemic attack (TIA), and cerebral infarction without residual deficits: Secondary | ICD-10-CM | POA: Insufficient documentation

## 2017-11-01 DIAGNOSIS — I10 Essential (primary) hypertension: Secondary | ICD-10-CM | POA: Insufficient documentation

## 2017-11-01 DIAGNOSIS — Z79899 Other long term (current) drug therapy: Secondary | ICD-10-CM | POA: Diagnosis not present

## 2017-11-01 DIAGNOSIS — F319 Bipolar disorder, unspecified: Secondary | ICD-10-CM

## 2017-11-01 DIAGNOSIS — I2510 Atherosclerotic heart disease of native coronary artery without angina pectoris: Secondary | ICD-10-CM | POA: Insufficient documentation

## 2017-11-01 DIAGNOSIS — M25572 Pain in left ankle and joints of left foot: Secondary | ICD-10-CM | POA: Diagnosis not present

## 2017-11-01 DIAGNOSIS — R7303 Prediabetes: Secondary | ICD-10-CM | POA: Diagnosis not present

## 2017-11-01 DIAGNOSIS — Z955 Presence of coronary angioplasty implant and graft: Secondary | ICD-10-CM | POA: Insufficient documentation

## 2017-11-01 DIAGNOSIS — I252 Old myocardial infarction: Secondary | ICD-10-CM | POA: Insufficient documentation

## 2017-11-01 DIAGNOSIS — Z882 Allergy status to sulfonamides status: Secondary | ICD-10-CM | POA: Insufficient documentation

## 2017-11-01 DIAGNOSIS — G8929 Other chronic pain: Secondary | ICD-10-CM | POA: Insufficient documentation

## 2017-11-01 MED ORDER — LOSARTAN POTASSIUM 100 MG PO TABS: 100.0000 mg | ORAL_TABLET | Freq: Every day | ORAL | 1 refills | Status: DC

## 2017-11-01 MED ORDER — AMLODIPINE BESYLATE 5 MG PO TABS: 5.0000 mg | ORAL_TABLET | Freq: Every day | ORAL | 1 refills | Status: DC

## 2017-11-01 MED ORDER — MELOXICAM 7.5 MG PO TABS: 7.5000 mg | ORAL_TABLET | Freq: Every day | ORAL | 1 refills | Status: DC

## 2017-11-01 MED ORDER — ATORVASTATIN CALCIUM 40 MG PO TABS: 40.0000 mg | ORAL_TABLET | Freq: Every day | ORAL | 0 refills | Status: DC

## 2017-11-01 MED ORDER — METOPROLOL TARTRATE 25 MG PO TABS: 25.0000 mg | ORAL_TABLET | Freq: Two times a day (BID) | ORAL | 1 refills | Status: DC

## 2017-11-01 MED ORDER — CLOPIDOGREL BISULFATE 75 MG PO TABS: 75.0000 mg | ORAL_TABLET | Freq: Every day | ORAL | 1 refills | Status: DC

## 2017-11-01 MED ORDER — AMLODIPINE BESYLATE 5 MG PO TABS: 5.0000 mg | ORAL_TABLET | Freq: Every day | ORAL | 0 refills | Status: DC

## 2017-11-01 MED ORDER — ATORVASTATIN CALCIUM 40 MG PO TABS: 40.0000 mg | ORAL_TABLET | Freq: Every day | ORAL | 1 refills | Status: DC

## 2017-11-01 NOTE — Progress Notes (Signed)
Subjective:  Patient ID: Vanessa Proctor, adult    DOB: 1955/12/08  Age: 62 y.o. MRN: [id number]  CC: Hypertension   HPI Vanessa Proctor  is a 62 year old female with a history of hepatitis C (completed treatment with Harvoni viral load from 10/12/16-undetectable), hypertension, previous CVA, bipolar disorder, prediabetes with A1c of 6.1) who presents today for a follow-up visit. She has a right groin swelling which is uncomfortable whenever she lies on her right side but is unsure if she would like it surgically removed.  She has been seen by GYN in the past and reassurance provided. Her Bipolar disorder is managed by psychiatry at [facility]. She reports doing well on her antihypertensive with no adverse effects from her medications and denies bruising on Plavix.  She has no weakness in extremities. Currently takes meloxicam as needed for chronic knee and ankle pain which has provided significant improvement in his symptoms since her last office visit.  Past Medical History:  Diagnosis Date  . Arthritis    knees  . Constipation last month  . Coronary artery disease   . Depression   . Drug abuse ([place])   . Hypertension   . Myocardial infarction [facility])    november 1st 2012  . Nausea & vomiting last month  . Rectal bleeding   . Stroke [facility])    november, 2012  . Tobacco abuse     Past Surgical History:  Procedure Laterality Date  . ABDOMINAL HYSTERECTOMY  1985   partial  . cataract surgery Left   . COLONOSCOPY N/A 04/28/2013   Procedure: COLONOSCOPY;  Surgeon: Beryle Beams, MD;  [facility];  Service: Endoscopy;  Laterality: N/A;  . CORONARY ANGIOPLASTY WITH STENT PLACEMENT  2012   x 1  . FINGER FRACTURE SURGERY Left    reduction with  splint  . knot drained and removed from side of face  2007  . TONSILLECTOMY  1980    Allergies  Allergen Reactions  . Bactrim Rash  . Sulfa Antibiotics Rash     Outpatient Medications Prior to Visit  Medication Sig  Dispense Refill  . aspirin 81 MG chewable tablet Chew 1 tablet (81 mg total) by mouth daily after lunch. 90 tablet 3  . divalproex (DEPAKOTE ER) 500 MG 24 hr tablet 3  qam 30 tablet 5  . Elastic Bandages & Supports (MEDICAL COMPRESSION STOCKINGS) MISC 1 Package by Does not apply route daily. Dispense 1 pair, to fit, wear daily, take off at night 1 each 0  . nitroGLYCERIN (NITROSTAT) 0.4 MG SL tablet Place 1 tablet (0.4 mg total) under the tongue every 5 (five) minutes as needed for chest pain. 90 tablet 3  . VENTOLIN HFA 108 (90 Base) MCG/ACT inhaler INHALE 2 PUFFS INTO THE LUNGS EVERY 6 (SIX) HOURS AS NEEDED FOR WHEEZING OR SHORTNESS OF BREATH. 18 g 1  . Vitamin D, Ergocalciferol, (DRISDOL) 50000 units CAPS capsule Take 1 capsule (50,000 Units total) by mouth every 7 (seven) days. 12 capsule 0  . amLODipine (NORVASC) 5 MG tablet TAKE 1 TABLET EVERY DAY 90 tablet 0  . atorvastatin (LIPITOR) 40 MG tablet TAKE 1 TABLET EVERY DAY 90 tablet 0  . clopidogrel (PLAVIX) 75 MG tablet TAKE 1 TABLET (75 MG TOTAL) BY MOUTH DAILY  AT LUNCH 90 tablet 1  . losartan (COZAAR) 100 MG tablet TAKE 1 TABLET EVERY DAY 90 tablet 1  . meloxicam (MOBIC) 7.5 MG tablet Take 1 tablet (7.5 mg total) by mouth daily. [place]  tablet 1  . metoprolol tartrate (LOPRESSOR) 25 MG tablet TAKE 1 TABLET (25 MG TOTAL) BY MOUTH 2 (TWO) TIMES DAILY. 180 tablet 1   No facility-administered medications prior to visit.     ROS Review of Systems  Constitutional: Negative for activity change, appetite change and fatigue.  HENT: Negative for congestion, sinus pressure and sore throat.   Eyes: Negative for visual disturbance.  Respiratory: Negative for cough, chest tightness, shortness of breath and wheezing.   Cardiovascular: Negative for chest pain and palpitations.  Gastrointestinal: Negative for abdominal distention, abdominal pain and constipation.  Endocrine: Negative for polydipsia.  Genitourinary: Negative for dysuria and frequency.    Musculoskeletal: Negative for arthralgias and back pain.  Skin: Negative for rash.  Neurological: Negative for tremors, light-headedness and numbness.  Hematological: Does not bruise/bleed easily.  Psychiatric/Behavioral: Negative for agitation and behavioral problems.    Objective:  BP 126/77   Pulse 79   Temp 98.1 F (36.7 C) (Oral)   Ht 5\' 1"  (1.549 m)   Wt 172 lb 9.6 oz (78.3 kg)   SpO2 98%   BMI 32.61 kg/m   BP/Weight 11/01/2017 06/09/2017 53/29/9242  Systolic BP 683 419 622  Diastolic BP 77 74 80  Wt. (Lbs) 172.6 176 175.4  BMI 32.61 33.25 33.14  Some encounter information is confidential and restricted. Go to Review Flowsheets activity to see all data.      Physical Exam  Constitutional: She is oriented to person, place, and time. She appears well-developed and well-nourished.  Cardiovascular: Normal rate, normal heart sounds and intact distal pulses.  No murmur heard. Pulmonary/Chest: Effort normal and breath sounds normal. She has no wheezes. She has no rales. She exhibits no tenderness.  Abdominal: Soft. Bowel sounds are normal. She exhibits no distension and no mass. There is no tenderness.  Genitourinary:  Genitourinary Comments: Right groin with nontender lipoma in crural fold , no punctum or drainage  Musculoskeletal: Normal range of motion.  Neurological: She is alert and oriented to person, place, and time.  Skin: Skin is warm and dry.     Assessment & Plan:   1. Coronary artery disease involving native coronary artery of native heart without angina pectoris Currently on Plavix due to previous history of stroke and CVA Risk factor modification - clopidogrel (PLAVIX) 75 MG tablet; Take 1 tablet (75 mg total) by mouth daily. At lunch  Dispense: 90 tablet; Refill: 1 - atorvastatin (LIPITOR) 40 MG tablet; Take 1 tablet (40 mg total) by mouth daily.  Dispense: 90 tablet; Refill: 1  2. HYPERTENSION, BENIGN ESSENTIAL Controlled Counseled on blood pressure  goal of less than 130/80, low-sodium, DASH diet, medication compliance, 150 minutes of moderate intensity exercise per week. Discussed medication compliance, adverse effects. - Basic Metabolic Panel - metoprolol tartrate (LOPRESSOR) 25 MG tablet; Take 1 tablet (25 mg total) by mouth 2 (two) times daily.  Dispense: 180 tablet; Refill: 1 - losartan (COZAAR) 100 MG tablet; Take 1 tablet (100 mg total) by mouth daily.  Dispense: 90 tablet; Refill: 1 - amLODipine (NORVASC) 5 MG tablet; Take 1 tablet (5 mg total) by mouth daily.  Dispense: 90 tablet; Refill: 1  3. Chronic pain of left knee Stable - meloxicam (MOBIC) 7.5 MG tablet; Take 1 tablet (7.5 mg total) by mouth daily.  Dispense: 30 tablet; Refill: 1  4. Chronic pain of left ankle Controlled - meloxicam (MOBIC) 7.5 MG tablet; Take 1 tablet (7.5 mg total) by mouth daily.  Dispense: 30 tablet; Refill: 1  5. Bipolar I disorder ([place]) Currently on Depakote as per mental health  6. Lipoma of right lower extremity Reassurance provided and she has been informed that if she changes her mind and wishes to pursue surgical management of lesion I will be happy to place a referral.   Meds ordered this encounter  Medications  . DISCONTD: atorvastatin (LIPITOR) 40 MG tablet    Sig: Take 1 tablet (40 mg total) by mouth daily.    Dispense:  90 tablet    Refill:  0  . DISCONTD: amLODipine (NORVASC) 5 MG tablet    Sig: Take 1 tablet (5 mg total) by mouth daily.    Dispense:  90 tablet    Refill:  0  . meloxicam (MOBIC) 7.5 MG tablet    Sig: Take 1 tablet (7.5 mg total) by mouth daily.    Dispense:  30 tablet    Refill:  1  . metoprolol tartrate (LOPRESSOR) 25 MG tablet    Sig: Take 1 tablet (25 mg total) by mouth 2 (two) times daily.    Dispense:  180 tablet    Refill:  1  . losartan (COZAAR) 100 MG tablet    Sig: Take 1 tablet (100 mg total) by mouth daily.    Dispense:  90 tablet    Refill:  1  . clopidogrel (PLAVIX) 75 MG tablet    Sig:  Take 1 tablet (75 mg total) by mouth daily. At lunch    Dispense:  90 tablet    Refill:  1  . atorvastatin (LIPITOR) 40 MG tablet    Sig: Take 1 tablet (40 mg total) by mouth daily.    Dispense:  90 tablet    Refill:  1  . amLODipine (NORVASC) 5 MG tablet    Sig: Take 1 tablet (5 mg total) by mouth daily.    Dispense:  90 tablet    Refill:  1    Follow-up: Return in about 6 months (around 05/04/2018) for follow up of chronic medical conditions.   Charlott Rakes MD

## 2017-11-01 NOTE — Patient Instructions (Signed)

## 2017-11-02 LAB — BASIC METABOLIC PANEL
BUN/Creatinine Ratio: 20 (ref 12–28)
BUN: 19 mg/dL (ref 8–27)
CO2: 23 mmol/L (ref 20–29)
Calcium: 9.9 mg/dL (ref 8.7–10.3)
Chloride: 105 mmol/L (ref 96–106)
Creatinine, Ser: 0.96 mg/dL (ref 0.57–1.00)
GFR calc Af Amer: 72 mL/min/{1.73_m2} (ref >59)
GFR calc non Af Amer: 63 mL/min/{1.73_m2} (ref >59)
Glucose: 129 mg/dL — ABNORMAL HIGH (ref 65–99)
Potassium: 4.4 mmol/L (ref 3.5–5.2)
Sodium: 142 mmol/L (ref 134–144)

## 2017-11-03 ENCOUNTER — Ambulatory Visit
Admission: RE | Admit: 2017-11-03 | Discharge: 2017-11-03 | Disposition: A | Discharge status: Home / Self Care | Payer: Medicare HMO | Source: Ambulatory Visit | Attending: Family Medicine | Admitting: Family Medicine

## 2017-11-03 DIAGNOSIS — R921 Mammographic calcification found on diagnostic imaging of breast: Secondary | ICD-10-CM

## 2018-02-16 ENCOUNTER — Ambulatory Visit (HOSPITAL_COMMUNITY): Payer: Self-pay | Admitting: Psychiatry

## 2018-03-16 ENCOUNTER — Ambulatory Visit (HOSPITAL_COMMUNITY)
Admission: RE | Admit: 2018-03-16 | Discharge: 2018-03-16 | Disposition: A | Discharge status: Home / Self Care | Payer: Medicare HMO | Source: Ambulatory Visit | Attending: Physician Assistant | Admitting: Physician Assistant

## 2018-03-16 ENCOUNTER — Ambulatory Visit (INDEPENDENT_AMBULATORY_CARE_PROVIDER_SITE_OTHER): Payer: Medicare HMO | Admitting: Psychiatry

## 2018-03-16 ENCOUNTER — Telehealth: Payer: Self-pay | Admitting: *Deleted

## 2018-03-16 ENCOUNTER — Ambulatory Visit: Payer: Medicare HMO | Admitting: Physician Assistant

## 2018-03-16 ENCOUNTER — Encounter (HOSPITAL_COMMUNITY): Payer: Self-pay | Admitting: Psychiatry

## 2018-03-16 VITALS — BP 160/90 | Ht 61.0 in | Wt 175.0 lb

## 2018-03-16 VITALS — BP 154/85 | HR 84 | Temp 98.2°F | Resp 18 | Ht 61.0 in | Wt 175.0 lb

## 2018-03-16 DIAGNOSIS — F319 Bipolar disorder, unspecified: Secondary | ICD-10-CM

## 2018-03-16 DIAGNOSIS — M25561 Pain in right knee: Secondary | ICD-10-CM | POA: Insufficient documentation

## 2018-03-16 DIAGNOSIS — Z7982 Long term (current) use of aspirin: Secondary | ICD-10-CM | POA: Insufficient documentation

## 2018-03-16 DIAGNOSIS — Z791 Long term (current) use of non-steroidal anti-inflammatories (NSAID): Secondary | ICD-10-CM

## 2018-03-16 DIAGNOSIS — I10 Essential (primary) hypertension: Secondary | ICD-10-CM

## 2018-03-16 DIAGNOSIS — Z79899 Other long term (current) drug therapy: Secondary | ICD-10-CM | POA: Insufficient documentation

## 2018-03-16 MED ORDER — MELOXICAM 7.5 MG PO TABS: 7.5000 mg | ORAL_TABLET | Freq: Every day | ORAL | 1 refills | Status: DC

## 2018-03-16 MED FILL — MELOXICAM 7.5 MG TABLET: 7.5 | 30 days supply | Qty: 30 | Fill #0

## 2018-03-16 NOTE — Telephone Encounter (Signed)
-----   Message from [place] Donovan, [place] sent at 03/16/2018  3:16 PM EST ----- Please call patient and let her know that her knee appears stable on xray without fracture.  It only shows her ongoing arthritis.  Follow up as planned.  Thanks, Freeman Caldron, PA-C

## 2018-03-16 NOTE — Progress Notes (Signed)
5 view Patient ID: Vanessa Proctor, adult   DOB: 01/08/1956, 62 y.o.   MRN: [id number]  Psychiatric Initial Adult Assessment   Patient Identification: Vanessa Proctor MRN:  [id number] Date of Evaluation:  03/16/2018 Referral Source: Self-referred Chief Complaint: Weight loss  Visit Diagnosis: Bipolar disorder, type 1 No diagnosis found.  History of Present Illness:   PTSD Symptoms:  Today the patient is seen with her daughter Vanessa Proctor the patient is actually doing well.  She is calm and stable.  We attempted to review for her past history of mania.  We are looking for collateral reports from her daughter.  With her daughter and the patient it was evident the patient really has never had a distinct episode of mania.  At this time she takes 500 mg of Depakote 2 of them.  The patient is actually sleeping and eating well.  She is got good energy.  She is very connected to her family.  She drinks no alcohol uses no drugs.  Patient denies any significant psychiatric pathology at this time.  She denies anxiety symptoms.  She is functioning actually very well.  Her history is hard to obtain.  She is a very poor historian.  She certainly is not suicidal.  Today was a very short session but was very helpful to have her daughter here. Past Psychiatric History: Psychiatric hospitalization at [facility] in 1999.  Previous Psychotropic Medications: Yes   Substance Abuse History in the last 12 months:  Yes.    Consequences of Substance Abuse:   Past Medical History:  Past Medical History:  Diagnosis Date  . Arthritis    knees  . Constipation last month  . Coronary artery disease   . Depression   . Drug abuse ([place])   . Hypertension   . Myocardial infarction [facility])    november 1st 2012  . Nausea & vomiting last month  . Rectal bleeding   . Stroke [facility])    november, 2012  . Tobacco abuse     Past Surgical History:  Procedure Laterality Date  . ABDOMINAL HYSTERECTOMY  1985    partial  . cataract surgery Left   . COLONOSCOPY N/A 04/28/2013   Procedure: COLONOSCOPY;  Surgeon: Beryle Beams, MD;  Location: [facility];  Service: Endoscopy;  Laterality: N/A;  . CORONARY ANGIOPLASTY WITH STENT PLACEMENT  2012   x 1  . FINGER FRACTURE SURGERY Left    reduction with  splint  . knot drained and removed from side of face  2007  . TONSILLECTOMY  1980    Family Psychiatric History:   Family History:  Family History  Problem Relation Age of Onset  . Cancer Mother        mass in abdomen  . Coronary artery disease Unknown   . Pancreatic cancer Sister   . Stroke Brother   . Heart attack Maternal Grandfather   . Hypertension Maternal Grandfather     Social History:   Social History   Socioeconomic History  . Marital status: Single    Spouse name: Not on file  . Number of children: Not on file  . Years of education: Not on file  . Highest education level: Not on file  Occupational History  . Not on file  Social Needs  . Financial resource strain: Not on file  . Food insecurity:    Worry: Not on file    Inability: Not on file  . Transportation needs:    Medical: Not  on file    Non-medical: Not on file  Tobacco Use  . Smoking status: Current Every Day Smoker    Packs/day: 0.50    Years: 40.00    Pack years: 20.00    Types: Cigarettes  . Smokeless tobacco: Never Used  Substance and Sexual Activity  . Alcohol use: No    Alcohol/week: 0.0 standard drinks  . Drug use: Yes    Comment: marijuana occassionaly  . Sexual activity: Not Currently  Lifestyle  . Physical activity:    Days per week: Not on file    Minutes per session: Not on file  . Stress: Not on file  Relationships  . Social connections:    Talks on phone: Not on file    Gets together: Not on file    Attends religious service: Not on file    Active member of club or organization: Not on file    Attends meetings of clubs or organizations: Not on file    Relationship status: Not on  file  Other Topics Concern  . Not on file  Social History Narrative  . Not on file    Additional Social History:   Allergies:   Allergies  Allergen Reactions  . Bactrim Rash  . Sulfa Antibiotics Rash    Metabolic Disorder Labs: Lab Results  Component Value Date   HGBA1C 6.1 06/09/2017   MPG 134 03/04/2015   MPG 128 (H) 03/09/2011   No results found for: PROLACTIN Lab Results  Component Value Date   CHOL 124 03/17/2017   TRIG 70 03/17/2017   HDL 37 (L) 03/17/2017   CHOLHDL 3.4 03/17/2017   VLDL 36 (H) 02/04/2016   LDLCALC 73 03/17/2017   LDLCALC 49 02/04/2016     Current Medications: Current Outpatient Medications  Medication Sig Dispense Refill  . amLODipine (NORVASC) 5 MG tablet Take 1 tablet (5 mg total) by mouth daily. 90 tablet 1  . aspirin 81 MG chewable tablet Chew 1 tablet (81 mg total) by mouth daily after lunch. 90 tablet 3  . atorvastatin (LIPITOR) 40 MG tablet Take 1 tablet (40 mg total) by mouth daily. 90 tablet 1  . clopidogrel (PLAVIX) 75 MG tablet Take 1 tablet (75 mg total) by mouth daily. At lunch 90 tablet 1  . divalproex (DEPAKOTE ER) 500 MG 24 hr tablet 3  qam 30 tablet 5  . Elastic Bandages & Supports (MEDICAL COMPRESSION STOCKINGS) MISC 1 Package by Does not apply route daily. Dispense 1 pair, to fit, wear daily, take off at night 1 each 0  . losartan (COZAAR) 100 MG tablet Take 1 tablet (100 mg total) by mouth daily. 90 tablet 1  . meloxicam (MOBIC) 7.5 MG tablet Take 1 tablet (7.5 mg total) by mouth daily. 30 tablet 1  . metoprolol tartrate (LOPRESSOR) 25 MG tablet Take 1 tablet (25 mg total) by mouth 2 (two) times daily. 180 tablet 1  . nitroGLYCERIN (NITROSTAT) 0.4 MG SL tablet Place 1 tablet (0.4 mg total) under the tongue every 5 (five) minutes as needed for chest pain. 90 tablet 3  . VENTOLIN HFA 108 (90 Base) MCG/ACT inhaler INHALE 2 PUFFS INTO THE LUNGS EVERY 6 (SIX) HOURS AS NEEDED FOR WHEEZING OR SHORTNESS OF BREATH. 18 g 1  . Vitamin  D, Ergocalciferol, (DRISDOL) 50000 units CAPS capsule Take 1 capsule (50,000 Units total) by mouth every 7 (seven) days. 12 capsule 0   No current facility-administered medications for this visit.     Neurologic: Headache: No  Seizure: No Paresthesias:No  Musculoskeletal: Strength & Muscle Tone: within normal limits Gait & Station: normal Patient leans  Psychiatric Specialty Exam: ROS  Blood pressure (!) 160/90, height 5\' 1"  (1.549 m), weight 175 lb (79.4 kg).Body mass index is 33.07 kg/m.  General Appearance: Casual  Eye Contact:  Good  Speech:  Clear and Coherent  Volume:  Normal  Mood:  Negative  Affect:  Appropriate  Thought Process:  Coherent  Orientation:  Full (Time, Place, and Person)  Thought Content:  WDL  Suicidal Thoughts:  No  Homicidal Thoughts:  No  Memory:  Negative  Judgement:  Good  Insight:  Good  Psychomotor Activity:  Normal  Concentration:  Good  Recall:  Good  Fund of Knowledge:Good  Language: Fair  Akathisia:  No  Handed:  Right  AIMS (if indicated):    Assets:  Desire for Improvement  ADL's:  Intact  Cognition: WNL  Sleep:     Treatment Plan Summary: 12/4/20194:59 PM At this time the patient will reduce her Depakote down to taking just 500 mg at night for the next 1 month.  And she she will discontinue the Depakote.  They were told to call if there are any distinct changes.  The patient will return to see me in 2 months from now which will be 1 month after she is off the Depakote.  It is noted that she does have a sister who is not well and is in the hospital.  I think the holidays might be difficult for this patient.  Therefore I want to go slowly on reducing her medicines.  Patient is denying any physical complaints at this time.  She denies chest pain shortness of breath or any neurological symptoms.  She will return to see me in 2 months.Marland Kitchen

## 2018-03-16 NOTE — Progress Notes (Signed)
Patient ID: Vanessa Proctor, adult   DOB: [birth year], 62 y.o.   MRN: [id number]   Vanessa Proctor, is a 62 y.o. adult  EXH:[id number]  FYB:[id number]  DOB - Vanessa Proctor  Subjective:  Chief Complaint and HPI: Vanessa Proctor is a 62 y.o. adult here today for R knee pain for a little over a week.  NKI. Also hears creaking and grinding.  Her mom had to have knee replacement.  No fever.  Ambulating without a limp.    ROS:   Constitutional:  No f/c, No night sweats, No unexplained weight loss. EENT:  No vision changes, No blurry vision, No hearing changes. No mouth, throat, or ear problems.  Respiratory: No cough, No SOB Cardiac: No CP, no palpitations GI:  No abd pain, No N/V/D. GU: No Urinary s/sx Musculoskeletal: R knee pain Neuro: No headache, no dizziness, no motor weakness.  Skin: No rash Endocrine:  No polydipsia. No polyuria.  Psych: Denies SI/HI  No problems updated.  ALLERGIES: Allergies  Allergen Reactions  . Bactrim Rash  . Sulfa Antibiotics Rash    PAST MEDICAL HISTORY: Past Medical History:  Diagnosis Date  . Arthritis    knees  . Constipation last month  . Coronary artery disease   . Depression   . Drug abuse ([place])   . Hypertension   . Myocardial infarction [facility])    november 1st 2012  . Nausea & vomiting last month  . Rectal bleeding   . Stroke [facility])    november, 2012  . Tobacco abuse     MEDICATIONS AT HOME: Prior to Admission medications   Medication Sig Start Date End Date Taking? Authorizing Provider  amLODipine (NORVASC) 5 MG tablet Take 1 tablet (5 mg total) by mouth daily. 11/01/17  Yes Charlott Rakes, MD  aspirin 81 MG chewable tablet Chew 1 tablet (81 mg total) by mouth daily after lunch. 08/24/16  Yes Langeland, Dawn T, MD  atorvastatin (LIPITOR) 40 MG tablet Take 1 tablet (40 mg total) by mouth daily. 11/01/17  Yes Charlott Rakes, MD  clopidogrel (PLAVIX) 75 MG tablet Take 1 tablet (75 mg total) by mouth daily. At lunch 11/01/17  Yes  Charlott Rakes, MD  divalproex (DEPAKOTE ER) 500 MG 24 hr tablet 3  qam 10/13/17  Yes Plovsky, Berneta Sages, MD  Elastic Bandages & Supports (MEDICAL COMPRESSION STOCKINGS) [place] 1 Package by Does not apply route daily. Dispense 1 pair, to fit, wear daily, take off at night 08/24/16  Yes Langeland, Dawn T, MD  losartan (COZAAR) 100 MG tablet Take 1 tablet (100 mg total) by mouth daily. 11/01/17  Yes Charlott Rakes, MD  meloxicam (MOBIC) 7.5 MG tablet Take 1 tablet (7.5 mg total) by mouth daily. 03/16/18  Yes Freeman Caldron M, PA-C  metoprolol tartrate (LOPRESSOR) 25 MG tablet Take 1 tablet (25 mg total) by mouth 2 (two) times daily. 11/01/17  Yes Charlott Rakes, MD  nitroGLYCERIN (NITROSTAT) 0.4 MG SL tablet Place 1 tablet (0.4 mg total) under the tongue every 5 (five) minutes as needed for chest pain. 03/31/16  Yes Langeland, Dawn T, MD  VENTOLIN HFA 108 (90 Base) MCG/ACT inhaler INHALE 2 PUFFS INTO THE LUNGS EVERY 6 (SIX) HOURS AS NEEDED FOR WHEEZING OR SHORTNESS OF BREATH. 04/27/17  Yes Newlin, Charlane Ferretti, MD  Vitamin D, Ergocalciferol, (DRISDOL) 50000 units CAPS capsule Take 1 capsule (50,000 Units total) by mouth every 7 (seven) days. 02/25/16  Yes Maren Reamer, MD     Objective:  Vanessa Proctor:   Vitals:  03/16/18 0845  BP: (!) 154/85  Pulse: 84  Resp: 18  Temp: 98.2 F (36.8 C)  TempSrc: Oral  SpO2: 99%  Weight: 175 lb (79.4 kg)  Height: 5\' 1"  (1.549 m)    General appearance : A&OX3. NAD. Non-toxic-appearing HEENT: Atraumatic and Normocephalic.  PERRLA. EOM intact.  Neck: supple, no JVD. No cervical lymphadenopathy. No thyromegaly Chest/Lungs:  Breathing-non-labored, Good air entry bilaterally, breath sounds normal without rales, rhonchi, or wheezing  CVS: S1 S2 regular, no murmurs, gallops, rubs  Extremities: R and L knee examined-R knee-no ballotment or obvious effusion.  Mild suprapatellar swelling.  Full S&ROM.  Ligaments stable.  No crepitus on exam. Bilateral Lower Ext shows no edema,  both legs are warm to touch with = pulse throughout Neurology:  CN II-XII grossly intact, Non focal.   Psych:  TP linear. J/I WNL. Normal speech. Appropriate eye contact and affect.  Skin:  No Rash  Data Review Lab Results  Component Value Date   HGBA1C 6.1 06/09/2017   HGBA1C 6.0 03/10/2017   HGBA1C 6.0 (H) 08/24/2016     Assessment & Plan   1. Acute pain of right knee - DG Knee Complete 4 Views Right; Future - Ambulatory referral to Orthopedic Surgery - meloxicam (MOBIC) 7.5 MG tablet; Take 1 tablet (7.5 mg total) by mouth daily.  Dispense: 30 tablet; Refill: 1  2. HYPERTENSION, BENIGN ESSENTIAL Uncontrolled-DASH diet.  Take meds as directed-she has been taking them at various times.     Patient have been counseled extensively about nutrition and exercise  Return for 04/25/2018 appt with Dr Margarita Rana.  The patient was given clear instructions to go to ER or return to medical center if symptoms don't improve, worsen or new problems develop. The patient verbalized understanding. The patient was told to call to get lab results if they haven't heard anything in the next week.   Freeman Caldron, PA-C [facility] and [facility] [place], [place]   03/16/2018, 8:55 AM

## 2018-03-16 NOTE — Telephone Encounter (Signed)
Patient is aware of xray only showing ongoing arthritis and no fracture. Patient advised to follow up as planned. Medical Assistant left message on patient's home and cell voicemail. Voicemail states to give a call back to [place] with [facility] at [phone].

## 2018-03-23 ENCOUNTER — Ambulatory Visit (INDEPENDENT_AMBULATORY_CARE_PROVIDER_SITE_OTHER): Payer: Medicare Other | Admitting: Orthopaedic Surgery

## 2018-03-30 ENCOUNTER — Encounter (INDEPENDENT_AMBULATORY_CARE_PROVIDER_SITE_OTHER): Payer: Self-pay | Admitting: Orthopaedic Surgery

## 2018-03-30 ENCOUNTER — Ambulatory Visit (INDEPENDENT_AMBULATORY_CARE_PROVIDER_SITE_OTHER): Payer: Medicare HMO | Admitting: Orthopaedic Surgery

## 2018-03-30 DIAGNOSIS — M1711 Unilateral primary osteoarthritis, right knee: Secondary | ICD-10-CM

## 2018-03-30 MED ORDER — BUPIVACAINE HCL 0.25 % IJ SOLN
2.0000 mL | INTRAMUSCULAR | Status: AC | PRN
  Administered 2018-03-30: 2 mL via INTRA_ARTICULAR

## 2018-03-30 MED ORDER — LIDOCAINE HCL 1 % IJ SOLN
2.0000 mL | INTRAMUSCULAR | Status: AC | PRN
  Administered 2018-03-30: 2 mL

## 2018-03-30 MED ORDER — METHYLPREDNISOLONE ACETATE 40 MG/ML IJ SUSP
40.0000 mg | INTRAMUSCULAR | Status: AC | PRN
  Administered 2018-03-30: 40 mg via INTRA_ARTICULAR
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
## 2018-04-25 ENCOUNTER — Ambulatory Visit: Payer: Self-pay | Admitting: Family Medicine

## 2018-06-08 ENCOUNTER — Ambulatory Visit (HOSPITAL_COMMUNITY): Payer: Medicare HMO | Admitting: Psychiatry

## 2018-06-24 ENCOUNTER — Encounter (HOSPITAL_COMMUNITY): Payer: Self-pay | Admitting: Psychiatry

## 2018-06-24 ENCOUNTER — Ambulatory Visit (INDEPENDENT_AMBULATORY_CARE_PROVIDER_SITE_OTHER): Payer: Medicare HMO | Admitting: Psychiatry

## 2018-06-24 ENCOUNTER — Other Ambulatory Visit: Payer: Self-pay

## 2018-06-24 VITALS — BP 140/82 | Ht 61.0 in | Wt 173.0 lb

## 2018-06-24 DIAGNOSIS — F339 Major depressive disorder, recurrent, unspecified: Secondary | ICD-10-CM

## 2018-06-24 MED ORDER — DIVALPROEX SODIUM ER 500 MG PO TB24: ORAL_TABLET | ORAL | 2 refills | Status: DC

## 2018-06-24 NOTE — Progress Notes (Signed)
5 view Patient ID: Vanessa Proctor, adult   DOB: [birth year], 63 y.o.   MRN: [id number]  Psychiatric Initial Adult Assessment   Patient Identification: VAUDIE ENGEBRETSEN MRN:  [id number] Date of Evaluation:  06/24/2018 Referral Source: Self-referred Chief Complaint: Weight loss  Visit Diagnosis: Bipolar disorder, type 1 Major depression remissio  At this time the patient is doing fairly well.  Her sister who is older did in fact died.  She was very close to her.  The patient got through the funeral and was depressed.  She felt very low.  At this time she is doing better.  She is nearly back to herself.  She ended up reducing the Depakote down to just 500 mg 1 of them at night.  She was asked instructed to discontinue it but she did not.  The patient is not sure if she feels comfortable stopping it at this time.  Medically the patient is fairly stable.  She is having some injections done to her right knee for pain.  Financially she is stable.  She does have some other illness in her family.  Her godmother who is mildly demented is resistant to her contact with her.  The problem is the godmother's husband has pancreatic cancer.  Generally the patient denies persistent daily depression.  She is sleeping and eating well.  She is got good energy and she can concentrate without a problem.  The patient triggers for the public school and at this time is been 1 put on hold because of the virus that is in the community.  The patient still enjoys a lot of time with herself.  She enjoys reading watching TV.  We reviewed the fact that in the past she been on Paxil and Celexa.  She said Celexa worked but she stopped it for reasons she does not remember.  Interview once again we find no evidence of mania in this individual.  The patient denies being suicidal and is functioning very well. History of Present Illness:   PTSD Symptoms:   Past Psychiatric History: Psychiatric hospitalization at [facility] in  1999.  Previous Psychotropic Medications: Yes   Substance Abuse History in the last 12 months:  Yes.    Consequences of Substance Abuse:   Past Medical History:  Past Medical History:  Diagnosis Date  . Arthritis    knees  . Constipation last month  . Coronary artery disease   . Depression   . Drug abuse ([place])   . Hypertension   . Myocardial infarction [facility])    november 1st 2012  . Nausea & vomiting last month  . Rectal bleeding   . Stroke [facility])    november, 2012  . Tobacco abuse     Past Surgical History:  Procedure Laterality Date  . ABDOMINAL HYSTERECTOMY  1985   partial  . cataract surgery Left   . COLONOSCOPY N/A 04/28/2013   Procedure: COLONOSCOPY;  Surgeon: Beryle Beams, MD;  Location: [facility];  Service: Endoscopy;  Laterality: N/A;  . CORONARY ANGIOPLASTY WITH STENT PLACEMENT  2012   x 1  . FINGER FRACTURE SURGERY Left    reduction with  splint  . knot drained and removed from side of face  2007  . TONSILLECTOMY  1980    Family Psychiatric History:   Family History:  Family History  Problem Relation Age of Onset  . Cancer Mother        mass in abdomen  . Coronary artery disease Unknown   .  Pancreatic cancer Sister   . Stroke Brother   . Heart attack Maternal Grandfather   . Hypertension Maternal Grandfather     Social History:   Social History   Socioeconomic History  . Marital status: Single    Spouse name: Not on file  . Number of children: Not on file  . Years of education: Not on file  . Highest education level: Not on file  Occupational History  . Not on file  Social Needs  . Financial resource strain: Not on file  . Food insecurity:    Worry: Not on file    Inability: Not on file  . Transportation needs:    Medical: Not on file    Non-medical: Not on file  Tobacco Use  . Smoking status: Current Every Day Smoker    Packs/day: 0.50    Years: 40.00    Pack years: 20.00    Types: Cigarettes  . Smokeless tobacco: Never  Used  Substance and Sexual Activity  . Alcohol use: No    Alcohol/week: 0.0 standard drinks  . Drug use: Yes    Comment: marijuana occassionaly  . Sexual activity: Not Currently  Lifestyle  . Physical activity:    Days per week: Not on file    Minutes per session: Not on file  . Stress: Not on file  Relationships  . Social connections:    Talks on phone: Not on file    Gets together: Not on file    Attends religious service: Not on file    Active member of club or organization: Not on file    Attends meetings of clubs or organizations: Not on file    Relationship status: Not on file  Other Topics Concern  . Not on file  Social History Narrative  . Not on file    Additional Social History:   Allergies:   Allergies  Allergen Reactions  . Bactrim Rash  . Sulfa Antibiotics Rash    Metabolic Disorder Labs: Lab Results  Component Value Date   HGBA1C 6.1 06/09/2017   MPG 134 03/04/2015   MPG 128 (H) 03/09/2011   No results found for: PROLACTIN Lab Results  Component Value Date   CHOL 124 03/17/2017   TRIG 70 03/17/2017   HDL 37 (L) 03/17/2017   CHOLHDL 3.4 03/17/2017   VLDL 36 (H) 02/04/2016   LDLCALC 73 03/17/2017   LDLCALC 49 02/04/2016     Current Medications: Current Outpatient Medications  Medication Sig Dispense Refill  . amLODipine (NORVASC) 5 MG tablet Take 1 tablet (5 mg total) by mouth daily. 90 tablet 1  . aspirin 81 MG chewable tablet Chew 1 tablet (81 mg total) by mouth daily after lunch. 90 tablet 3  . atorvastatin (LIPITOR) 40 MG tablet Take 1 tablet (40 mg total) by mouth daily. 90 tablet 1  . clopidogrel (PLAVIX) 75 MG tablet Take 1 tablet (75 mg total) by mouth daily. At lunch 90 tablet 1  . divalproex (DEPAKOTE ER) 500 MG 24 hr tablet 1 bid 180 tablet 2  . Elastic Bandages & Supports (MEDICAL COMPRESSION STOCKINGS) MISC 1 Package by Does not apply route daily. Dispense 1 pair, to fit, wear daily, take off at night 1 each 0  . losartan  (COZAAR) 100 MG tablet Take 1 tablet (100 mg total) by mouth daily. 90 tablet 1  . meloxicam (MOBIC) 7.5 MG tablet Take 1 tablet (7.5 mg total) by mouth daily. 30 tablet 1  . metoprolol tartrate (LOPRESSOR) 25  MG tablet Take 1 tablet (25 mg total) by mouth 2 (two) times daily. 180 tablet 1  . nitroGLYCERIN (NITROSTAT) 0.4 MG SL tablet Place 1 tablet (0.4 mg total) under the tongue every 5 (five) minutes as needed for chest pain. 90 tablet 3  . VENTOLIN HFA 108 (90 Base) MCG/ACT inhaler INHALE 2 PUFFS INTO THE LUNGS EVERY 6 (SIX) HOURS AS NEEDED FOR WHEEZING OR SHORTNESS OF BREATH. 18 g 1  . Vitamin D, Ergocalciferol, (DRISDOL) 50000 units CAPS capsule Take 1 capsule (50,000 Units total) by mouth every 7 (seven) days. 12 capsule 0   No current facility-administered medications for this visit.     Neurologic: Headache: No Seizure: No Paresthesias:No  Musculoskeletal: Strength & Muscle Tone: within normal limits Gait & Station: normal Patient leans  Psychiatric Specialty Exam: ROS  There were no vitals taken for this visit.There is no height or weight on file to calculate BMI.  General Appearance: Casual  Eye Contact:  Good  Speech:  Clear and Coherent  Volume:  Normal  Mood:  Negative  Affect:  Appropriate  Thought Process:  Coherent  Orientation:  Full (Time, Place, and Person)  Thought Content:  WDL  Suicidal Thoughts:  No  Homicidal Thoughts:  No  Memory:  Negative  Judgement:  Good  Insight:  Good  Psychomotor Activity:  Normal  Concentration:  Good  Recall:  Good  Fund of Knowledge:Good  Language: Fair  Akathisia:  No  Handed:  Right  AIMS (if indicated):    Assets:  Desire for Improvement  ADL's:  Intact  Cognition: WNL  Sleep:     Treatment Plan Summary: 3/13/20209:32 AM  At this time I think this patient has a mood disorder but I doubt it is bipolar disorder.  I think is more likely major depression.  She has been diagnosed with this condition before and  she had a good response with Celexa.  For now psychologically she does not want to change the Depakote.  She does not have a therapist and she enjoys and benefits by talking with me every few months.  For now we will adjust the Depakote to taking 2 a day but her plans ultimately this year will probably be to change the Depakote to Celexa.  She is agreed with this idea.  For now because of the changes at her work setting because of the virus and because of the death of her sister she is not ready to make big changes.  She also has a very ill godmother.  The patient will continue taking Depakote and we will clarify that it should be 500 mg twice daily.

## 2018-07-06 ENCOUNTER — Ambulatory Visit: Payer: Self-pay | Admitting: Family Medicine

## 2018-07-11 ENCOUNTER — Telehealth (INDEPENDENT_AMBULATORY_CARE_PROVIDER_SITE_OTHER): Payer: Self-pay

## 2018-07-11 NOTE — Telephone Encounter (Signed)
Called patient and asked the screening questions.  Do you have now or have you had in the past 7 days a fever and/or chills? NO  Do you have now or have you had in the past 7 days a cough? NO  Do you have now or have yo0u had in the last 7 days nausea, vomiting or abdominal pain? NO  Have you been exposed to anyone who has tested positive for COVID-19? NO  Have you or anyone who lives with you traveled within the last month? NO 

## 2018-07-12 ENCOUNTER — Encounter (INDEPENDENT_AMBULATORY_CARE_PROVIDER_SITE_OTHER): Payer: Self-pay | Admitting: Orthopaedic Surgery

## 2018-07-12 ENCOUNTER — Ambulatory Visit (INDEPENDENT_AMBULATORY_CARE_PROVIDER_SITE_OTHER): Payer: Medicare HMO | Admitting: Orthopaedic Surgery

## 2018-07-12 ENCOUNTER — Ambulatory Visit (INDEPENDENT_AMBULATORY_CARE_PROVIDER_SITE_OTHER): Payer: Medicare HMO

## 2018-07-12 ENCOUNTER — Other Ambulatory Visit: Payer: Self-pay

## 2018-07-12 DIAGNOSIS — M545 Low back pain, unspecified: Secondary | ICD-10-CM

## 2018-07-12 DIAGNOSIS — G8929 Other chronic pain: Secondary | ICD-10-CM

## 2018-07-12 DIAGNOSIS — M1711 Unilateral primary osteoarthritis, right knee: Secondary | ICD-10-CM

## 2018-07-12 MED ORDER — METHYLPREDNISOLONE ACETATE 40 MG/ML IJ SUSP
40.0000 mg | INTRAMUSCULAR | Status: AC | PRN
  Administered 2018-07-12: 40 mg via INTRA_ARTICULAR

## 2018-07-12 MED ORDER — CYCLOBENZAPRINE HCL 5 MG PO TABS: 5.0000 mg | ORAL_TABLET | Freq: Three times a day (TID) | ORAL | 3 refills | Status: DC | PRN

## 2018-07-12 MED ORDER — MELOXICAM 7.5 MG PO TABS: 7.5000 mg | ORAL_TABLET | Freq: Two times a day (BID) | ORAL | 2 refills | Status: DC | PRN

## 2018-07-12 MED ORDER — LIDOCAINE HCL 1 % IJ SOLN
2.0000 mL | INTRAMUSCULAR | Status: AC | PRN
  Administered 2018-07-12: 2 mL

## 2018-07-12 MED ORDER — BUPIVACAINE HCL 0.5 % IJ SOLN
2.0000 mL | INTRAMUSCULAR | Status: AC | PRN
  Administered 2018-07-12: 2 mL via INTRA_ARTICULAR
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
## 2018-07-15 ENCOUNTER — Telehealth: Payer: Self-pay | Admitting: Family Medicine

## 2018-07-15 NOTE — Telephone Encounter (Signed)
Called in stated triage nurse advised her to call him back if she had any problems with her medication she would like a follow up

## 2018-07-15 NOTE — Telephone Encounter (Signed)
Patients call returned.  Patient verified by name and date of birth.  Patient states that they need medications questions.  Patient had two medications ordered but was unsure as to which pharmacy they were sent to.  Patient was called back and the information as to which was left on the phone as per instructions of patient.

## 2018-07-19 MED FILL — CYCLOBENZAPRINE 5 MG TABLET: 5 | 10 days supply | Qty: 30 | Fill #0

## 2018-08-02 ENCOUNTER — Other Ambulatory Visit: Payer: Self-pay

## 2018-08-02 ENCOUNTER — Encounter: Payer: Self-pay | Admitting: Family Medicine

## 2018-08-02 ENCOUNTER — Ambulatory Visit: Payer: Self-pay | Attending: Family Medicine | Admitting: Family Medicine

## 2018-08-02 DIAGNOSIS — I10 Essential (primary) hypertension: Secondary | ICD-10-CM

## 2018-08-02 DIAGNOSIS — I2510 Atherosclerotic heart disease of native coronary artery without angina pectoris: Secondary | ICD-10-CM

## 2018-08-02 DIAGNOSIS — M1711 Unilateral primary osteoarthritis, right knee: Secondary | ICD-10-CM

## 2018-08-02 MED ORDER — ALBUTEROL SULFATE HFA 108 (90 BASE) MCG/ACT IN AERS: INHALATION_SPRAY | RESPIRATORY_TRACT | 1 refills | Status: DC

## 2018-08-02 MED ORDER — ATORVASTATIN CALCIUM 40 MG PO TABS: 40.0000 mg | ORAL_TABLET | Freq: Every day | ORAL | 1 refills | Status: DC

## 2018-08-02 MED ORDER — AMLODIPINE BESYLATE 5 MG PO TABS: 5.0000 mg | ORAL_TABLET | Freq: Every day | ORAL | 1 refills | Status: DC

## 2018-08-02 MED ORDER — METOPROLOL TARTRATE 25 MG PO TABS: 25.0000 mg | ORAL_TABLET | Freq: Two times a day (BID) | ORAL | 1 refills | Status: DC

## 2018-08-02 MED ORDER — LOSARTAN POTASSIUM 100 MG PO TABS: 100.0000 mg | ORAL_TABLET | Freq: Every day | ORAL | 1 refills | Status: DC

## 2018-08-02 NOTE — Progress Notes (Signed)
Virtual Visit via Telephone Note  I connected with Vanessa Proctor, on 08/02/2018 at 3:58 PM by telephone and verified that I am speaking with the correct person using two identifiers.   Consent: I discussed the limitations, risks, security and privacy concerns of performing an evaluation and management service by telephone and the availability of in person appointments. I also discussed with the patient that there may be a patient responsible charge related to this service. The patient expressed understanding and agreed to proceed.   Location of Patient: Patient's home  Location of Provider: Clinic   Persons participating in Telemedicine visit: Vanessa Proctor-CMA Dr. Felecia Shelling    History of Present Illness: Vanessa Proctor  is a 63 year old female with a history of hepatitis C (completed treatment with Harvoni viral load from 10/12/16-undetectable), hypertension, previous CVA, bipolar disorder, prediabetes (with A1c of 6.1) who presents today for a follow-up visit.  She is status post right knee cortisone injection by orthopedics-Dr. Erlinda Hong on 07/12/2018 and reports improvement in knee pain which is rated as a 3-4/10 at this time.  She also had low back pain and this is minimal at this time.  She attributes improvement to the use of Flexeril in addition to meloxicam.  The pain is worse when she wakes up in the morning.  She endorses compliance with her antihypertensive and is requesting refills.  Denies chest pain, pedal edema. Her Bipolar disorder is managed by behavioral health. She informs me today there was a mixup with her insurance and she was being reflected as having sent for a different kind of insurance which she never did.  She has her agent working on this with her.  Past Medical History:  Diagnosis Date  . Arthritis    knees  . Constipation last month  . Coronary artery disease   . Depression   . Drug abuse ([place])   . Hypertension   .  Myocardial infarction [facility])    november 1st 2012  . Nausea & vomiting last month  . Rectal bleeding   . Stroke [facility])    november, 2012  . Tobacco abuse    Allergies  Allergen Reactions  . Bactrim Rash  . Sulfa Antibiotics Rash    Current Outpatient Medications on File Prior to Visit  Medication Sig Dispense Refill  . amLODipine (NORVASC) 5 MG tablet Take 1 tablet (5 mg total) by mouth daily. 90 tablet 1  . aspirin 81 MG chewable tablet Chew 1 tablet (81 mg total) by mouth daily after lunch. 90 tablet 3  . atorvastatin (LIPITOR) 40 MG tablet Take 1 tablet (40 mg total) by mouth daily. 90 tablet 1  . clopidogrel (PLAVIX) 75 MG tablet Take 1 tablet (75 mg total) by mouth daily. At lunch 90 tablet 1  . cyclobenzaprine (FLEXERIL) 5 MG tablet Take 1 tablet (5 mg total) by mouth 3 (three) times daily as needed for muscle spasms. 30 tablet 3  . divalproex (DEPAKOTE ER) 500 MG 24 hr tablet 1 bid 180 tablet 2  . Elastic Bandages & Supports (MEDICAL COMPRESSION STOCKINGS) MISC 1 Package by Does not apply route daily. Dispense 1 pair, to fit, wear daily, take off at night 1 each 0  . losartan (COZAAR) 100 MG tablet Take 1 tablet (100 mg total) by mouth daily. 90 tablet 1  . meloxicam (MOBIC) 7.5 MG tablet Take 1 tablet (7.5 mg total) by mouth daily. 30 tablet 1  . metoprolol tartrate (LOPRESSOR) 25 MG tablet Take 1  tablet (25 mg total) by mouth 2 (two) times daily. 180 tablet 1  . nitroGLYCERIN (NITROSTAT) 0.4 MG SL tablet Place 1 tablet (0.4 mg total) under the tongue every 5 (five) minutes as needed for chest pain. 90 tablet 3  . VENTOLIN HFA 108 (90 Base) MCG/ACT inhaler INHALE 2 PUFFS INTO THE LUNGS EVERY 6 (SIX) HOURS AS NEEDED FOR WHEEZING OR SHORTNESS OF BREATH. 18 g 1  . Vitamin D, Ergocalciferol, (DRISDOL) 50000 units CAPS capsule Take 1 capsule (50,000 Units total) by mouth every 7 (seven) days. 12 capsule 0  . meloxicam (MOBIC) 7.5 MG tablet Take 1 tablet (7.5 mg total) by mouth 2 (two)  times daily as needed for pain. (Patient not taking: Reported on 08/02/2018) 60 tablet 2   No current facility-administered medications on file prior to visit.     Observations/Objective: Awake, alert, oriented x3 Not in acute distress  CMP Latest Ref Rng & Units 11/01/2017 03/17/2017 08/24/2016  Glucose 65 - 99 mg/dL 129(H) 90 81  BUN 8 - 27 mg/dL 19 12 18   Creatinine 0.57 - 1.00 mg/dL 0.96 0.75 0.65  Sodium 134 - 144 mmol/L 142 143 140  Potassium 3.5 - 5.2 mmol/L 4.4 4.1 4.1  Chloride 96 - 106 mmol/L 105 103 102  CO2 20 - 29 mmol/L 23 26 24   Calcium 8.7 - 10.3 mg/dL 9.9 10.3 9.9  Total Protein 6.0 - 8.5 g/dL - 7.4 -  Total Bilirubin 0.0 - 1.2 mg/dL - 0.3 -  Alkaline Phos 39 - 117 IU/L - 84 -  AST 0 - 40 IU/L - 19 -  ALT 0 - 32 IU/L - 11 -    Lipid Panel     Component Value Date/Time   CHOL 124 03/17/2017 0840   TRIG 70 03/17/2017 0840   HDL 37 (L) 03/17/2017 0840   CHOLHDL 3.4 03/17/2017 0840   CHOLHDL 2.9 02/04/2016 1220   VLDL 36 (H) 02/04/2016 1220   LDLCALC 73 03/17/2017 0840    Lab Results  Component Value Date   HGBA1C 6.1 06/09/2017    Assessment and Plan: 1. HYPERTENSION, BENIGN ESSENTIAL Advised to keep blood pressure log at home Continue current regimen Counseled on blood pressure goal of less than 130/80, low-sodium, DASH diet, medication compliance, 150 minutes of moderate intensity exercise per week. Discussed medication compliance, adverse effects. - amLODipine (NORVASC) 5 MG tablet; Take 1 tablet (5 mg total) by mouth daily.  Dispense: 90 tablet; Refill: 1 - losartan (COZAAR) 100 MG tablet; Take 1 tablet (100 mg total) by mouth daily.  Dispense: 90 tablet; Refill: 1 - metoprolol tartrate (LOPRESSOR) 25 MG tablet; Take 1 tablet (25 mg total) by mouth 2 (two) times daily.  Dispense: 180 tablet; Refill: 1  2. Coronary artery disease involving native coronary artery of native heart without angina pectoris Asymptomatic Risk factor modification including  smoking cessation She continues to smoke and is not ready to quit - atorvastatin (LIPITOR) 40 MG tablet; Take 1 tablet (40 mg total) by mouth daily.  Dispense: 90 tablet; Refill: 1  3. Unilateral primary osteoarthritis, right knee Improved Status post right knee cortisone injection by Orthopedics Continue meloxicam and Flexeril   Needs A1c at next visit  Follow Up Instructions: 3 months   I discussed the assessment and treatment plan with the patient. The patient was provided an opportunity to ask questions and all were answered. The patient agreed with the plan and demonstrated an understanding of the instructions.   The patient was advised  to call back or seek an in-person evaluation if the symptoms worsen or if the condition fails to improve as anticipated.     I provided 16 minutes total of non-face-to-face time during this encounter including median intraservice time, reviewing previous notes, labs, imaging, medications and explaining diagnosis and management.     Charlott Rakes, MD, FAAFP. [facility] and [place], [place]   08/02/2018, 3:58 PM

## 2018-08-02 NOTE — Progress Notes (Signed)
Patient has been called and DOB has been verified. Patient has been screened and transferred to PCP to start phone visit.  C/C:Hypertention.    

## 2018-08-03 DIAGNOSIS — R1084 Generalized abdominal pain: Secondary | ICD-10-CM | POA: Diagnosis not present

## 2018-08-03 DIAGNOSIS — I10 Essential (primary) hypertension: Secondary | ICD-10-CM | POA: Diagnosis not present

## 2018-08-03 DIAGNOSIS — Z86010 Personal history of colonic polyps: Secondary | ICD-10-CM | POA: Diagnosis not present

## 2018-08-03 DIAGNOSIS — K625 Hemorrhage of anus and rectum: Secondary | ICD-10-CM | POA: Diagnosis not present

## 2018-08-04 MED FILL — PEG-3350 AND ELECTROLYTES S: 236 | 1 days supply | Qty: 4000 | Fill #0

## 2018-08-06 MED FILL — AMLODIPINE BESYLATE 5 MG TA: 5 | 30 days supply | Qty: 30 | Fill #0

## 2018-08-09 ENCOUNTER — Telehealth: Payer: Self-pay | Admitting: Family Medicine

## 2018-08-09 MED FILL — PEG-3350 SOLUTION: 420 | 1 days supply | Qty: 4000 | Fill #0

## 2018-08-09 NOTE — Telephone Encounter (Signed)
Patient called stating that she is going o have surgery on Monday may 4th but that the provider is wanting her to get cleared prior to her procedure. They were needing a fax verifying that she can stop taking her Plavix a week prior to her procedure. Please follow up

## 2018-08-09 NOTE — Telephone Encounter (Signed)
Can patient be cleared of plavix for surgery. Paperwork is in folder to be signed.

## 2018-08-10 NOTE — Telephone Encounter (Signed)
Paperwork has been faxed over.

## 2018-08-10 NOTE — Telephone Encounter (Signed)
Paper work completed. Ok to hold Plavix x1 week

## 2018-08-23 DIAGNOSIS — Z86010 Personal history of colonic polyps: Secondary | ICD-10-CM | POA: Diagnosis not present

## 2018-08-26 ENCOUNTER — Other Ambulatory Visit: Payer: Self-pay | Admitting: Family Medicine

## 2018-08-26 DIAGNOSIS — I10 Essential (primary) hypertension: Secondary | ICD-10-CM

## 2018-08-29 ENCOUNTER — Other Ambulatory Visit: Payer: Self-pay | Admitting: Family Medicine

## 2018-08-29 MED ORDER — LOSARTAN POTASSIUM 100 MG PO TABS: 100.0000 mg | ORAL_TABLET | Freq: Every day | ORAL | 1 refills | Status: DC

## 2018-09-26 ENCOUNTER — Other Ambulatory Visit: Payer: Self-pay | Admitting: Family Medicine

## 2018-09-26 DIAGNOSIS — R921 Mammographic calcification found on diagnostic imaging of breast: Secondary | ICD-10-CM

## 2018-09-28 ENCOUNTER — Ambulatory Visit (INDEPENDENT_AMBULATORY_CARE_PROVIDER_SITE_OTHER): Payer: Medicare HMO | Admitting: Psychiatry

## 2018-09-28 ENCOUNTER — Other Ambulatory Visit: Payer: Self-pay

## 2018-09-28 DIAGNOSIS — F325 Major depressive disorder, single episode, in full remission: Secondary | ICD-10-CM | POA: Diagnosis not present

## 2018-09-28 MED ORDER — DIVALPROEX SODIUM ER 250 MG PO TB24: ORAL_TABLET | ORAL | 4 refills | Status: DC

## 2018-09-28 NOTE — Progress Notes (Signed)
5 view Patient ID: Vanessa Proctor, adult   DOB: June 07, 1955, 63 y.o.   MRN: [id number]  Psychiatric Initial Adult Assessment   Patient Identification: Vanessa Proctor MRN:  [id number] Date of Evaluation:  09/28/2018 Referral Source: Self-referred Chief Complaint: Weight loss  Visit Diagnosis: Bipolar disorder, type 1 Major depression remissio    This patient has been treated for a mood disorder of unclear type.  I suspect she has had major depression and she used to take Celexa.  For variety reasons she has been taking Depakote instead but takes a large pill 500 mg twice daily which is hard to swallow.  Most importantly the patient is doing very well.  Her mood is good.  She feels better than her last visit.  She is gotten with the death of her sister.  Her godmother apparently is doing fairly well.  Overall her stress level is lower.  This despite the presence of the viral pandemic.  She is not letting it get to her.  She did not mind spending a lot of time by herself and never really felt lonely.  Now things are loosening and she is getting out a bit more.  She feels positive and optimistic.  She is sleeping and eating very well.  She is got good energy.  She is not anhedonic.  She certainly is not suicidal.  She denies chest pain shortness of breath or any signs of respiratory infection. History of Present Illness:   PTSD Symptoms:   Past Psychiatric History: Psychiatric hospitalization at [facility] regional in 1999.  Previous Psychotropic Medications: Yes   Substance Abuse History in the last 12 months:  Yes.    Consequences of Substance Abuse:   Past Medical History:  Past Medical History:  Diagnosis Date  . Arthritis    knees  . Constipation last month  . Coronary artery disease   . Depression   . Drug abuse ([place])   . Hypertension   . Myocardial infarction [facility])    november 1st 2012  . Nausea & vomiting last month  . Rectal bleeding   . Stroke [facility])    november,  2012  . Tobacco abuse     Past Surgical History:  Procedure Laterality Date  . ABDOMINAL HYSTERECTOMY  1985   partial  . cataract surgery Left   . COLONOSCOPY N/A 04/28/2013   Procedure: COLONOSCOPY;  Surgeon: Beryle Beams, MD;  Location: [facility];  Service: Endoscopy;  Laterality: N/A;  . CORONARY ANGIOPLASTY WITH STENT PLACEMENT  2012   x 1  . FINGER FRACTURE SURGERY Left    reduction with  splint  . knot drained and removed from side of face  2007  . TONSILLECTOMY  1980    Family Psychiatric History:   Family History:  Family History  Problem Relation Age of Onset  . Cancer Mother        mass in abdomen  . Coronary artery disease Other   . Pancreatic cancer Sister   . Stroke Brother   . Heart attack Maternal Grandfather   . Hypertension Maternal Grandfather     Social History:   Social History   Socioeconomic History  . Marital status: Single    Spouse name: Not on file  . Number of children: Not on file  . Years of education: Not on file  . Highest education level: Not on file  Occupational History  . Not on file  Social Needs  . Financial resource strain: Not on  file  . Food insecurity    Worry: Not on file    Inability: Not on file  . Transportation needs    Medical: Not on file    Non-medical: Not on file  Tobacco Use  . Smoking status: Current Every Day Smoker    Packs/day: 0.50    Years: 40.00    Pack years: 20.00    Types: Cigarettes  . Smokeless tobacco: Never Used  Substance and Sexual Activity  . Alcohol use: No    Alcohol/week: 0.0 standard drinks  . Drug use: Yes    Comment: marijuana occassionaly  . Sexual activity: Not Currently  Lifestyle  . Physical activity    Days per week: Not on file    Minutes per session: Not on file  . Stress: Not on file  Relationships  . Social Herbalist on phone: Not on file    Gets together: Not on file    Attends religious service: Not on file    Active member of club or  organization: Not on file    Attends meetings of clubs or organizations: Not on file    Relationship status: Not on file  Other Topics Concern  . Not on file  Social History Narrative  . Not on file    Additional Social History:   Allergies:   Allergies  Allergen Reactions  . Bactrim Rash  . Sulfa Antibiotics Rash    Metabolic Disorder Labs: Lab Results  Component Value Date   HGBA1C 6.1 06/09/2017   MPG 134 03/04/2015   MPG 128 (H) 03/09/2011   No results found for: PROLACTIN Lab Results  Component Value Date   CHOL 124 03/17/2017   TRIG 70 03/17/2017   HDL 37 (L) 03/17/2017   CHOLHDL 3.4 03/17/2017   VLDL 36 (H) 02/04/2016   LDLCALC 73 03/17/2017   LDLCALC 49 02/04/2016     Current Medications: Current Outpatient Medications  Medication Sig Dispense Refill  . albuterol (VENTOLIN HFA) 108 (90 Base) MCG/ACT inhaler INHALE 2 PUFFS EVERY 6 HOURS AS NEEDED FOR WHEEZING OR SHORTNESS OF BREATH. 18 g 1  . amLODipine (NORVASC) 5 MG tablet Take 1 tablet (5 mg total) by mouth daily. 90 tablet 1  . aspirin 81 MG chewable tablet Chew 1 tablet (81 mg total) by mouth daily after lunch. 90 tablet 3  . atorvastatin (LIPITOR) 40 MG tablet Take 1 tablet (40 mg total) by mouth daily. 90 tablet 1  . clopidogrel (PLAVIX) 75 MG tablet Take 1 tablet (75 mg total) by mouth daily. At lunch 90 tablet 1  . cyclobenzaprine (FLEXERIL) 5 MG tablet Take 1 tablet (5 mg total) by mouth 3 (three) times daily as needed for muscle spasms. 30 tablet 3  . divalproex (DEPAKOTE ER) 250 MG 24 hr tablet 1 bid 60 tablet 4  . Elastic Bandages & Supports (MEDICAL COMPRESSION STOCKINGS) MISC 1 Package by Does not apply route daily. Dispense 1 pair, to fit, wear daily, take off at night 1 each 0  . losartan (COZAAR) 100 MG tablet Take 1 tablet (100 mg total) by mouth daily. 90 tablet 1  . meloxicam (MOBIC) 7.5 MG tablet Take 1 tablet (7.5 mg total) by mouth daily. 30 tablet 1  . meloxicam (MOBIC) 7.5 MG tablet  Take 1 tablet (7.5 mg total) by mouth 2 (two) times daily as needed for pain. (Patient not taking: Reported on 08/02/2018) 60 tablet 2  . metoprolol tartrate (LOPRESSOR) 25 MG tablet Take 1  tablet (25 mg total) by mouth 2 (two) times daily. 180 tablet 1  . nitroGLYCERIN (NITROSTAT) 0.4 MG SL tablet Place 1 tablet (0.4 mg total) under the tongue every 5 (five) minutes as needed for chest pain. 90 tablet 3   No current facility-administered medications for this visit.     Neurologic: Headache: No Seizure: No Paresthesias:No  Musculoskeletal: Strength & Muscle Tone: within normal limits Gait & Station: normal Patient leans  Psychiatric Specialty Exam: ROS  There were no vitals taken for this visit.There is no height or weight on file to calculate BMI.  General Appearance: Casual  Eye Contact:  Good  Speech:  Clear and Coherent  Volume:  Normal  Mood:  Negative  Affect:  Appropriate  Thought Process:  Coherent  Orientation:  Full (Time, Place, and Person)  Thought Content:  WDL  Suicidal Thoughts:  No  Homicidal Thoughts:  No  Memory:  Negative  Judgement:  Good  Insight:  Good  Psychomotor Activity:  Normal  Concentration:  Good  Recall:  Good  Fund of Knowledge:Good  Language: Fair  Akathisia:  No  Handed:  Right  AIMS (if indicated):    Assets:  Desire for Improvement  ADL's:  Intact  Cognition: WNL  Sleep:     Treatment Plan Summary: 6/17/20204:32 PM  At this time the patient is agreed to reduce her Depakote.  We will go ahead and reduce it down to 250 mg twice daily.  Her only diagnosis will be that of major depression in remission.  Obviously would like her off Depakote and onto Celexa 10 mg.  At this time patient has no vegetative symptoms.  She is agreed to this recommendation.  She will begin on the lower dose dose of Depakote in the next few days.  The patient was told to call us if there are any problems and she will have a return appointment to see [place] in 3  months.

## 2018-11-07 ENCOUNTER — Telehealth: Payer: Self-pay | Admitting: Family Medicine

## 2018-11-07 NOTE — Telephone Encounter (Signed)
Patient called asking if her appointment was  Tele vist or person . I told her tele visit but I told her you were calling her in the morning to make sure . Thanks

## 2018-11-08 ENCOUNTER — Other Ambulatory Visit: Payer: Self-pay

## 2018-11-08 ENCOUNTER — Ambulatory Visit: Payer: Medicare HMO | Attending: Family Medicine | Admitting: Family Medicine

## 2018-11-08 DIAGNOSIS — M545 Low back pain, unspecified: Secondary | ICD-10-CM

## 2018-11-08 DIAGNOSIS — I10 Essential (primary) hypertension: Secondary | ICD-10-CM | POA: Diagnosis not present

## 2018-11-08 DIAGNOSIS — G8929 Other chronic pain: Secondary | ICD-10-CM | POA: Diagnosis not present

## 2018-11-08 MED ORDER — CYCLOBENZAPRINE HCL 5 MG PO TABS: 5.0000 mg | ORAL_TABLET | Freq: Three times a day (TID) | ORAL | 3 refills | Status: DC | PRN

## 2018-11-08 NOTE — Progress Notes (Signed)
Virtual Visit via Telephone Note  I connected with Vanessa Proctor, on 11/08/2018 at 1:36 PM by telephone due to the COVID-19 pandemic and verified that I am speaking with the correct person using two identifiers.   Consent: I discussed the limitations, risks, security and privacy concerns of performing an evaluation and management service by telephone and the availability of in person appointments. I also discussed with the patient that there may be a patient responsible charge related to this service. The patient expressed understanding and agreed to proceed.   Location of Patient: Home  Location of Provider: Clinic   Persons participating in Telemedicine visit: Catrena Vari [place]-CMA Dr. Felecia Shelling     History of Present Illness: Vanessa Proctor  is a 63 year old female with a history of hepatitis C (completed treatment with Harvoni viral load from 10/12/16-undetectable), hypertension, previous CVA, bipolar disorder, prediabetes (with A1c of 6.1) who presents today for a follow-up visit. She complains of low back pain ever since running out of Flexeril which she received from her Orthopedic Dr Erlinda Hong. Pain does not radiate down her lower extremity. Described as mild to moderate at this time. Compliant with antihypertensive and denies adverse effects from her medications. She denies chest pain or dyspnea.   Past Medical History:  Diagnosis Date  . Arthritis    knees  . Constipation last month  . Coronary artery disease   . Depression   . Drug abuse ([place])   . Hypertension   . Myocardial infarction [facility])    november 1st 2012  . Nausea & vomiting last month  . Rectal bleeding   . Stroke [facility])    november, 2012  . Tobacco abuse    Allergies  Allergen Reactions  . Bactrim Rash  . Sulfa Antibiotics Rash    Current Outpatient Medications on File Prior to Visit  Medication Sig Dispense Refill  . albuterol (VENTOLIN HFA) 108 (90 Base) MCG/ACT  inhaler INHALE 2 PUFFS EVERY 6 HOURS AS NEEDED FOR WHEEZING OR SHORTNESS OF BREATH. 18 g 1  . amLODipine (NORVASC) 5 MG tablet Take 1 tablet (5 mg total) by mouth daily. 90 tablet 1  . aspirin 81 MG chewable tablet Chew 1 tablet (81 mg total) by mouth daily after lunch. 90 tablet 3  . atorvastatin (LIPITOR) 40 MG tablet Take 1 tablet (40 mg total) by mouth daily. 90 tablet 1  . clopidogrel (PLAVIX) 75 MG tablet Take 1 tablet (75 mg total) by mouth daily. At lunch 90 tablet 1  . cyclobenzaprine (FLEXERIL) 5 MG tablet Take 1 tablet (5 mg total) by mouth 3 (three) times daily as needed for muscle spasms. 30 tablet 3  . divalproex (DEPAKOTE ER) 250 MG 24 hr tablet 1 bid 60 tablet 4  . Elastic Bandages & Supports (MEDICAL COMPRESSION STOCKINGS) MISC 1 Package by Does not apply route daily. Dispense 1 pair, to fit, wear daily, take off at night 1 each 0  . losartan (COZAAR) 100 MG tablet Take 1 tablet (100 mg total) by mouth daily. 90 tablet 1  . metoprolol tartrate (LOPRESSOR) 25 MG tablet Take 1 tablet (25 mg total) by mouth 2 (two) times daily. 180 tablet 1  . nitroGLYCERIN (NITROSTAT) 0.4 MG SL tablet Place 1 tablet (0.4 mg total) under the tongue every 5 (five) minutes as needed for chest pain. 90 tablet 3  . meloxicam (MOBIC) 7.5 MG tablet Take 1 tablet (7.5 mg total) by mouth daily. (Patient not taking: Reported on 11/08/2018) 30 tablet  1  . meloxicam (MOBIC) 7.5 MG tablet Take 1 tablet (7.5 mg total) by mouth 2 (two) times daily as needed for pain. (Patient not taking: Reported on 08/02/2018) 60 tablet 2   No current facility-administered medications on file prior to visit.     Observations/Objective: AAOx3 Not in acute distress  Lab Results  Component Value Date   HGBA1C 6.1 06/09/2017    Assessment and Plan: 1. HYPERTENSION, BENIGN ESSENTIAL Controlled Continue Metoprolol, Losartan Counseled on blood pressure goal of less than 130/80, low-sodium, DASH diet, medication compliance, 150  minutes of moderate intensity exercise per week. Discussed medication compliance, adverse effects.   2. Chronic midline low back pain without sciatica Advised to apply heat Refilled Flexeril Currently followed by Ortho - cyclobenzaprine (FLEXERIL) 5 MG tablet; Take 1 tablet (5 mg total) by mouth 3 (three) times daily as needed for muscle spasms.  Dispense: 60 tablet; Refill: 3   Follow Up Instructions: Return in about 3 months (around 02/08/2019) for medical conditions.    I discussed the assessment and treatment plan with the patient. The patient was provided an opportunity to ask questions and all were answered. The patient agreed with the plan and demonstrated an understanding of the instructions.   The patient was advised to call back or seek an in-person evaluation if the symptoms worsen or if the condition fails to improve as anticipated.     I provided 12 minutes total of non-face-to-face time during this encounter including median intraservice time, reviewing previous notes, labs, imaging, medications, management and patient verbalized understanding.     Charlott Rakes, MD, FAAFP. [facility] [place], [place]   11/08/2018, 1:36 PM

## 2018-11-08 NOTE — Progress Notes (Signed)
Patient has been called and DOB has been verified. Patient has been screened and transferred to PCP to start phone visit.     

## 2018-11-11 ENCOUNTER — Other Ambulatory Visit: Payer: Self-pay

## 2018-11-11 ENCOUNTER — Ambulatory Visit
Admission: RE | Admit: 2018-11-11 | Discharge: 2018-11-11 | Disposition: A | Discharge status: Home / Self Care | Payer: Medicare HMO | Source: Ambulatory Visit | Attending: Family Medicine | Admitting: Family Medicine

## 2018-11-11 DIAGNOSIS — R921 Mammographic calcification found on diagnostic imaging of breast: Secondary | ICD-10-CM

## 2018-12-08 MED FILL — CYCLOBENZAPRINE 5 MG TABLET: 5 | 10 days supply | Qty: 30 | Fill #1

## 2018-12-09 ENCOUNTER — Telehealth: Payer: Self-pay | Admitting: Family Medicine

## 2018-12-09 DIAGNOSIS — E119 Type 2 diabetes mellitus without complications: Secondary | ICD-10-CM | POA: Diagnosis not present

## 2018-12-09 MED ORDER — METFORMIN HCL 500 MG PO TABS: 500.0000 mg | ORAL_TABLET | Freq: Two times a day (BID) | ORAL | 3 refills | Status: DC

## 2018-12-09 MED FILL — metFORMIN HCL 500 MG TABS: 500 | 30 days supply | Qty: 60 | Fill #0

## 2018-12-09 NOTE — Telephone Encounter (Signed)
In home A1c received from 11/02/18 collected by her insurance company revealed A1c of 8.7. I have sent a prescription for Metformin to her Pharmacy.

## 2018-12-12 NOTE — Telephone Encounter (Signed)
Patient was called and informed of results and medication being sent to pharmacy.

## 2018-12-14 ENCOUNTER — Other Ambulatory Visit (HOSPITAL_COMMUNITY): Payer: Self-pay

## 2018-12-14 MED ORDER — DIVALPROEX SODIUM ER 250 MG PO TB24: ORAL_TABLET | ORAL | 1 refills | Status: DC

## 2018-12-14 MED FILL — DIVALPROEX SOD ER 250 MG TA: 250 | 30 days supply | Qty: 60 | Fill #0

## 2018-12-15 ENCOUNTER — Other Ambulatory Visit: Payer: Self-pay | Admitting: Pharmacist

## 2018-12-15 DIAGNOSIS — I2510 Atherosclerotic heart disease of native coronary artery without angina pectoris: Secondary | ICD-10-CM

## 2018-12-15 DIAGNOSIS — I10 Essential (primary) hypertension: Secondary | ICD-10-CM

## 2018-12-15 MED ORDER — AMLODIPINE BESYLATE 5 MG PO TABS: 5.0000 mg | ORAL_TABLET | Freq: Every day | ORAL | 0 refills | Status: DC

## 2018-12-15 MED ORDER — ATORVASTATIN CALCIUM 40 MG PO TABS: 40.0000 mg | ORAL_TABLET | Freq: Every day | ORAL | 0 refills | Status: DC

## 2018-12-15 MED ORDER — CLOPIDOGREL BISULFATE 75 MG PO TABS: 75.0000 mg | ORAL_TABLET | Freq: Every day | ORAL | 0 refills | Status: DC

## 2018-12-23 ENCOUNTER — Encounter: Payer: Self-pay | Admitting: Family Medicine

## 2018-12-28 ENCOUNTER — Ambulatory Visit (HOSPITAL_COMMUNITY): Payer: Medicare HMO | Admitting: Psychiatry

## 2019-01-10 ENCOUNTER — Other Ambulatory Visit: Payer: Self-pay

## 2019-01-10 ENCOUNTER — Ambulatory Visit (HOSPITAL_BASED_OUTPATIENT_CLINIC_OR_DEPARTMENT_OTHER): Payer: Medicare HMO | Admitting: Psychiatry

## 2019-01-10 DIAGNOSIS — F325 Major depressive disorder, single episode, in full remission: Secondary | ICD-10-CM | POA: Diagnosis not present

## 2019-01-10 MED ORDER — CITALOPRAM HYDROBROMIDE 10 MG PO TABS: 10.0000 mg | ORAL_TABLET | Freq: Every day | ORAL | 4 refills | Status: DC

## 2019-01-10 NOTE — Progress Notes (Signed)
5 view Patient ID: Vanessa Proctor, adult   DOB: 1955/11/01, 63 y.o.   MRN: [id number]  Psychiatric Initial Adult Assessment   Patient Identification: Vanessa Proctor MRN:  [id number] Date of Evaluation:  01/10/2019 Referral Source: Self-referred Chief Complaint: Weight loss  Visit Diagnosis: Bipolar disorder, type 1 Major depression remissio    At this time the patient is doing well.  She finds her self irritable sometimes but she knows is because of the pandemic.  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  She does make contact with her daughter who lives in [place].  The patient seems generally stable.  She denies chest pain or shortness of breath or any physical complaints.  I find little evidence that she clearly has somatic disorder.  It is noted in 1999 she was hospitalized voluntarily because she felt depressed and suicidal.  At this time the patient is sleeping and eating well.  She is got good energy.  She has no problems thinking or concentrating.  She is functioning very well.  She is not suicidal.  She denies use of alcohol or drugs.  She actually is very stable. PTSD Symptoms:   Past Psychiatric History: Psychiatric hospitalization at [facility] in 1999.  Previous Psychotropic Medications: Yes   Substance Abuse History in the last 12 months:  Yes.    Consequences of Substance Abuse:   Past Medical History:  Past Medical History:  Diagnosis Date  . Arthritis    knees  . Constipation last month  . Coronary artery disease   . Depression   . Drug abuse ([place])   . Hypertension   . Myocardial infarction [facility])    november 1st 2012  . Nausea & vomiting last month  . Rectal bleeding   . Stroke [facility])    november, 2012  . Tobacco abuse     Past Surgical History:  Procedure Laterality Date  . ABDOMINAL HYSTERECTOMY  1985   partial  . cataract surgery Left   . COLONOSCOPY N/A 04/28/2013   Procedure: COLONOSCOPY;  Surgeon: Beryle Beams, MD;  [facility];  Service: Endoscopy;  Laterality: N/A;  . CORONARY ANGIOPLASTY WITH STENT PLACEMENT  2012   x 1  . FINGER FRACTURE SURGERY Left    reduction with  splint  . knot drained and removed from side of face  2007  . TONSILLECTOMY  1980    Family Psychiatric History:   Family History:  Family History  Problem Relation Age of Onset  . Cancer Mother        mass in abdomen  . Coronary artery disease Other   . Pancreatic cancer Sister   . Stroke Brother   . Heart attack Maternal Grandfather   . Hypertension Maternal Grandfather     Social History:   Social History   Socioeconomic History  . Marital status: Single    Spouse name: Not on file  . Number of children: Not on file  . Years of education: Not on file  . Highest education level: Not on file  Occupational History  . Not on file  Social Needs  . Financial resource strain: Not on file  . Food insecurity    Worry: Not on file    Inability: Not on file  . Transportation needs    Medical: Not on file    Non-medical: Not on file  Tobacco Use  . Smoking status: Current Every Day Smoker  Packs/day: 0.50    Years: 40.00    Pack years: 20.00    Types: Cigarettes  . Smokeless tobacco: Never Used  Substance and Sexual Activity  . Alcohol use: No    Alcohol/week: 0.0 standard drinks  . Drug use: Yes    Comment: marijuana occassionaly  . Sexual activity: Not Currently  Lifestyle  . Physical activity    Days per week: Not on file    Minutes per session: Not on file  . Stress: Not on file  Relationships  . Social Herbalist on phone: Not on file    Gets together: Not on file    Attends religious service: Not on file    Active member of club or organization: Not on file    Attends meetings of clubs or organizations: Not on file    Relationship status: Not on file  Other Topics Concern  . Not on file  Social History Narrative  . Not on file    Additional Social History:    Allergies:   Allergies  Allergen Reactions  . Bactrim Rash  . Sulfa Antibiotics Rash    Metabolic Disorder Labs: Lab Results  Component Value Date   HGBA1C 6.1 06/09/2017   MPG 134 03/04/2015   MPG 128 (H) 03/09/2011   No results found for: PROLACTIN Lab Results  Component Value Date   CHOL 124 03/17/2017   TRIG 70 03/17/2017   HDL 37 (L) 03/17/2017   CHOLHDL 3.4 03/17/2017   VLDL 36 (H) 02/04/2016   LDLCALC 73 03/17/2017   LDLCALC 49 02/04/2016     Current Medications: Current Outpatient Medications  Medication Sig Dispense Refill  . albuterol (VENTOLIN HFA) 108 (90 Base) MCG/ACT inhaler INHALE 2 PUFFS EVERY 6 HOURS AS NEEDED FOR WHEEZING OR SHORTNESS OF BREATH. 18 g 1  . amLODipine (NORVASC) 5 MG tablet Take 1 tablet (5 mg total) by mouth daily. 90 tablet 0  . aspirin 81 MG chewable tablet Chew 1 tablet (81 mg total) by mouth daily after lunch. 90 tablet 3  . atorvastatin (LIPITOR) 40 MG tablet Take 1 tablet (40 mg total) by mouth daily. 90 tablet 0  . citalopram (CELEXA) 10 MG tablet Take 1 tablet (10 mg total) by mouth daily. 30 tablet 4  . clopidogrel (PLAVIX) 75 MG tablet Take 1 tablet (75 mg total) by mouth daily. At lunch 90 tablet 0  . cyclobenzaprine (FLEXERIL) 5 MG tablet Take 1 tablet (5 mg total) by mouth 3 (three) times daily as needed for muscle spasms. 60 tablet 3  . Elastic Bandages & Supports (MEDICAL COMPRESSION STOCKINGS) MISC 1 Package by Does not apply route daily. Dispense 1 pair, to fit, wear daily, take off at night 1 each 0  . losartan (COZAAR) 100 MG tablet Take 1 tablet (100 mg total) by mouth daily. 90 tablet 1  . meloxicam (MOBIC) 7.5 MG tablet Take 1 tablet (7.5 mg total) by mouth daily. (Patient not taking: Reported on 11/08/2018) 30 tablet 1  . meloxicam (MOBIC) 7.5 MG tablet Take 1 tablet (7.5 mg total) by mouth 2 (two) times daily as needed for pain. (Patient not taking: Reported on 08/02/2018) 60 tablet 2  . metFORMIN (GLUCOPHAGE) 500 MG  tablet Take 1 tablet (500 mg total) by mouth 2 (two) times daily with a meal. 60 tablet 3  . metoprolol tartrate (LOPRESSOR) 25 MG tablet Take 1 tablet (25 mg total) by mouth 2 (two) times daily. 180 tablet 1  . nitroGLYCERIN (  NITROSTAT) 0.4 MG SL tablet Place 1 tablet (0.4 mg total) under the tongue every 5 (five) minutes as needed for chest pain. 90 tablet 3   No current facility-administered medications for this visit.     Neurologic: Headache: No Seizure: No Paresthesias:No  Musculoskeletal: Strength & Muscle Tone: within normal limits Gait & Station: normal Patient leans  Psychiatric Specialty Exam: ROS  There were no vitals taken for this visit.There is no height or weight on file to calculate BMI.  General Appearance: Casual  Eye Contact:  Good  Speech:  Clear and Coherent  Volume:  Normal  Mood:  Negative  Affect:  Appropriate  Thought Process:  Coherent  Orientation:  Full (Time, Place, and Person)  Thought Content:  WDL  Suicidal Thoughts:  No  Homicidal Thoughts:  No  Memory:  Negative  Judgement:  Good  Insight:  Good  Psychomotor Activity:  Normal  Concentration:  Good  Recall:  Good  Fund of Knowledge:Good  Language: Fair  Akathisia:  No  Handed:  Right  AIMS (if indicated):    Assets:  Desire for Improvement  ADL's:  Intact  Cognition: WNL  Sleep:     Treatment Plan Summary: 9/29/20202:34 PM  At this time her #1 problem is major depression in remission.  At this time we will start her on Celexa 10 mg in the morning.  We asked her to discontinue her Depakote.  The patient was asked to call if there are any problems and asked to return to see [place] in 2 months.  The patient is stable.  She is not suicidal.  She is functioning well.

## 2019-01-13 ENCOUNTER — Ambulatory Visit (HOSPITAL_COMMUNITY): Payer: Self-pay | Admitting: Psychiatry

## 2019-01-16 MED FILL — CYCLOBENZAPRINE 5 MG TABLET: 5 | 10 days supply | Qty: 30 | Fill #2

## 2019-01-16 MED FILL — metFORMIN HCL 500 MG TABS: 500 | 30 days supply | Qty: 60 | Fill #1

## 2019-01-30 ENCOUNTER — Other Ambulatory Visit: Payer: Self-pay

## 2019-01-30 ENCOUNTER — Ambulatory Visit: Payer: Medicare HMO | Attending: Family Medicine | Admitting: Family Medicine

## 2019-01-30 ENCOUNTER — Encounter: Payer: Self-pay | Admitting: Family Medicine

## 2019-01-30 VITALS — BP 131/81 | HR 81 | Temp 98.2°F | Ht 61.0 in | Wt 175.2 lb

## 2019-01-30 DIAGNOSIS — R7303 Prediabetes: Secondary | ICD-10-CM | POA: Diagnosis not present

## 2019-01-30 DIAGNOSIS — I10 Essential (primary) hypertension: Secondary | ICD-10-CM

## 2019-01-30 DIAGNOSIS — F17210 Nicotine dependence, cigarettes, uncomplicated: Secondary | ICD-10-CM | POA: Diagnosis not present

## 2019-01-30 DIAGNOSIS — E669 Obesity, unspecified: Secondary | ICD-10-CM

## 2019-01-30 DIAGNOSIS — Z720 Tobacco use: Secondary | ICD-10-CM

## 2019-01-30 DIAGNOSIS — Z23 Encounter for immunization: Secondary | ICD-10-CM | POA: Diagnosis not present

## 2019-01-30 DIAGNOSIS — I2510 Atherosclerotic heart disease of native coronary artery without angina pectoris: Secondary | ICD-10-CM

## 2019-01-30 LAB — POCT GLYCOSYLATED HEMOGLOBIN (HGB A1C): HbA1c, POC (prediabetic range): 6.1 % (ref 5.7–6.4)

## 2019-01-30 MED ORDER — METFORMIN HCL 500 MG PO TABS: 500.0000 mg | ORAL_TABLET | Freq: Two times a day (BID) | ORAL | 1 refills | Status: DC

## 2019-01-30 MED ORDER — CLOPIDOGREL BISULFATE 75 MG PO TABS: 75.0000 mg | ORAL_TABLET | Freq: Every day | ORAL | 1 refills | Status: DC

## 2019-01-30 MED ORDER — ASPIRIN 81 MG PO CHEW: 81.0000 mg | CHEWABLE_TABLET | Freq: Every day | ORAL | 3 refills | Status: DC

## 2019-01-30 MED ORDER — METOPROLOL TARTRATE 25 MG PO TABS: 25.0000 mg | ORAL_TABLET | Freq: Two times a day (BID) | ORAL | 1 refills | Status: DC

## 2019-01-30 MED ORDER — ATORVASTATIN CALCIUM 40 MG PO TABS: 40.0000 mg | ORAL_TABLET | Freq: Every day | ORAL | 1 refills | Status: DC

## 2019-01-30 MED ORDER — AMLODIPINE BESYLATE 5 MG PO TABS: 5.0000 mg | ORAL_TABLET | Freq: Every day | ORAL | 1 refills | Status: DC

## 2019-01-30 MED ORDER — LOSARTAN POTASSIUM 100 MG PO TABS: 100.0000 mg | ORAL_TABLET | Freq: Every day | ORAL | 1 refills | Status: DC

## 2019-01-30 NOTE — Progress Notes (Signed)
Subjective:  Patient ID: Vanessa Proctor, adult    DOB: Nov 12, 1955  Age: 63 y.o. MRN: [id number]  CC: Hypertension   HPI Vanessa Proctor is a 63 year old female with a history of hepatitis C (completed treatment with Harvoni viral load from 10/12/16-undetectable), hypertension, CAD, previous CVA, bipolar disorder, prediabetes (with A1c of 6.1) here for follow-up visit. She informs me today she had her home A1c done by [facility] and the level came back somewhere around 8 with recommendations for a diabetic kit as she was informed she was diabetic.  Her A1c today is 6.1 and was also 6.1 last year and she remains on Metformin. Compliant with her antihypertensive and no complaints of adverse effects from her medications.  With regards to her CAD she has no chest pain or dyspnea and remains on Plavix given she also had previous history of CVA.  Her bipolar disorder is managed by psych and she was switched from Depakote to Lexapro and is doing well on her medications. She does not exercise regularly but informs me she walks a flight of 13 stairs to her home. She continues to smoke a pack of cigarettes over about 1/2 days. Denies acute concerns today.  Past Medical History:  Diagnosis Date  . Arthritis    knees  . Constipation last month  . Coronary artery disease   . Depression   . Drug abuse ([place])   . Hypertension   . Myocardial infarction [facility])    november 1st 2012  . Nausea & vomiting last month  . Rectal bleeding   . Stroke [facility])    november, 2012  . Tobacco abuse     Past Surgical History:  Procedure Laterality Date  . ABDOMINAL HYSTERECTOMY  1985   partial  . cataract surgery Left   . COLONOSCOPY N/A 04/28/2013   Procedure: COLONOSCOPY;  Surgeon: Beryle Beams, MD;  [facility];  Service: Endoscopy;  Laterality: N/A;  . CORONARY ANGIOPLASTY WITH STENT PLACEMENT  2012   x 1  . FINGER FRACTURE SURGERY Left    reduction with  splint  . knot drained and  removed from side of face  2007  . TONSILLECTOMY  1980    Family History  Problem Relation Age of Onset  . Cancer Mother        mass in abdomen  . Coronary artery disease Other   . Pancreatic cancer Sister   . Stroke Brother   . Heart attack Maternal Grandfather   . Hypertension Maternal Grandfather     Allergies  Allergen Reactions  . Bactrim Rash  . Sulfa Antibiotics Rash    Outpatient Medications Prior to Visit  Medication Sig Dispense Refill  . albuterol (VENTOLIN HFA) 108 (90 Base) MCG/ACT inhaler INHALE 2 PUFFS EVERY 6 HOURS AS NEEDED FOR WHEEZING OR SHORTNESS OF BREATH. 18 g 1  . citalopram (CELEXA) 10 MG tablet Take 1 tablet (10 mg total) by mouth daily. 30 tablet 4  . cyclobenzaprine (FLEXERIL) 5 MG tablet Take 1 tablet (5 mg total) by mouth 3 (three) times daily as needed for muscle spasms. 60 tablet 3  . Elastic Bandages & Supports (MEDICAL COMPRESSION STOCKINGS) MISC 1 Package by Does not apply route daily. Dispense 1 pair, to fit, wear daily, take off at night 1 each 0  . nitroGLYCERIN (NITROSTAT) 0.4 MG SL tablet Place 1 tablet (0.4 mg total) under the tongue every 5 (five) minutes as needed for chest pain. 90 tablet 3  . amLODipine (NORVASC)  5 MG tablet Take 1 tablet (5 mg total) by mouth daily. 90 tablet 0  . aspirin 81 MG chewable tablet Chew 1 tablet (81 mg total) by mouth daily after lunch. 90 tablet 3  . atorvastatin (LIPITOR) 40 MG tablet Take 1 tablet (40 mg total) by mouth daily. 90 tablet 0  . clopidogrel (PLAVIX) 75 MG tablet Take 1 tablet (75 mg total) by mouth daily. At lunch 90 tablet 0  . losartan (COZAAR) 100 MG tablet Take 1 tablet (100 mg total) by mouth daily. 90 tablet 1  . metFORMIN (GLUCOPHAGE) 500 MG tablet Take 1 tablet (500 mg total) by mouth 2 (two) times daily with a meal. 60 tablet 3  . metoprolol tartrate (LOPRESSOR) 25 MG tablet Take 1 tablet (25 mg total) by mouth 2 (two) times daily. 180 tablet 1  . meloxicam (MOBIC) 7.5 MG tablet Take  1 tablet (7.5 mg total) by mouth daily. (Patient not taking: Reported on 11/08/2018) 30 tablet 1  . meloxicam (MOBIC) 7.5 MG tablet Take 1 tablet (7.5 mg total) by mouth 2 (two) times daily as needed for pain. (Patient not taking: Reported on 08/02/2018) 60 tablet 2   No facility-administered medications prior to visit.      ROS Review of Systems  Constitutional: Negative for activity change, appetite change and fatigue.  HENT: Negative for congestion, sinus pressure and sore throat.   Eyes: Negative for visual disturbance.  Respiratory: Negative for cough, chest tightness, shortness of breath and wheezing.   Cardiovascular: Negative for chest pain and palpitations.  Gastrointestinal: Negative for abdominal distention, abdominal pain and constipation.  Endocrine: Negative for polydipsia.  Genitourinary: Negative for dysuria and frequency.  Musculoskeletal: Negative for arthralgias and back pain.  Skin: Negative for rash.  Neurological: Negative for tremors, light-headedness and numbness.  Hematological: Does not bruise/bleed easily.  Psychiatric/Behavioral: Negative for agitation and behavioral problems.    Objective:  BP 131/81   Pulse 81   Temp 98.2 F (36.8 C) (Oral)   Ht '5\' 1"'  (1.549 m)   Wt 175 lb 3.2 oz (79.5 kg)   SpO2 97%   BMI 33.10 kg/m   BP/Weight 01/30/2019 03/16/2018 9/37/3428  Systolic BP 768 115 726  Diastolic BP 81 85 77  Wt. (Lbs) 175.2 175 172.6  BMI 33.1 33.07 32.61  Some encounter information is confidential and restricted. Go to Review Flowsheets activity to see all data.      Constitutional: Obese Eyes: PERRLA HEENT: Head is atraumatic, normal sinuses, normal oropharynx, normal appearing tonsils and palate, tympanic membrane is normal bilaterally. Neck: normal range of motion, no thyromegaly, no JVD Cardiovascular: normal rate and rhythm, normal heart sounds, no murmurs, rub or gallop, no pedal edema Respiratory: Normal breath sounds, clear to  auscultation bilaterally, no wheezes, no rales, no rhonchi Abdomen: soft, not tender to palpation, normal bowel sounds, no enlarged organs Musculoskeletal: Full ROM, no tenderness in joints Skin: warm and dry, no lesions. Neurological: alert, oriented x3, cranial nerves I-XII grossly intact , normal motor strength, normal sensation. Psychological: normal mood.    CMP Latest Ref Rng & Units 11/01/2017 03/17/2017 08/24/2016  Glucose 65 - 99 mg/dL 129(H) 90 81  BUN 8 - 27 mg/dL '19 12 18  ' Creatinine 0.57 - 1.00 mg/dL 0.96 0.75 0.65  Sodium 134 - 144 mmol/L 142 143 140  Potassium 3.5 - 5.2 mmol/L 4.4 4.1 4.1  Chloride 96 - 106 mmol/L 105 103 102  CO2 20 - 29 mmol/L '23 26 24  ' Calcium  8.7 - 10.3 mg/dL 9.9 10.3 9.9  Total Protein 6.0 - 8.5 g/dL - 7.4 -  Total Bilirubin 0.0 - 1.2 mg/dL - 0.3 -  Alkaline Phos 39 - 117 IU/L - 84 -  AST 0 - 40 IU/L - 19 -  ALT 0 - 32 IU/L - 11 -    Lipid Panel     Component Value Date/Time   CHOL 124 03/17/2017 0840   TRIG 70 03/17/2017 0840   HDL 37 (L) 03/17/2017 0840   CHOLHDL 3.4 03/17/2017 0840   CHOLHDL 2.9 02/04/2016 1220   VLDL 36 (H) 02/04/2016 1220   LDLCALC 73 03/17/2017 0840    CBC    Component Value Date/Time   WBC 4.4 06/10/2015 1813   RBC 4.28 06/10/2015 1813   HGB 12.2 06/10/2015 1813   HCT 36.8 06/10/2015 1813   PLT 222 06/10/2015 1813   MCV 86.0 06/10/2015 1813   MCH 28.5 06/10/2015 1813   MCHC 33.2 06/10/2015 1813   RDW 13.5 06/10/2015 1813   LYMPHSABS 1.0 06/10/2015 1813   MONOABS 0.5 06/10/2015 1813   EOSABS 0.2 06/10/2015 1813   BASOSABS 0.0 06/10/2015 1813    Lab Results  Component Value Date   HGBA1C 6.1 01/30/2019    Assessment & Plan:   1. Prediabetes Controlled with A1c of 6.1 Counseled on continued lifestyle modifications to prevent development of diabetes - POCT glycosylated hemoglobin (Hb A1C) - metFORMIN (GLUCOPHAGE) 500 MG tablet; Take 1 tablet (500 mg total) by mouth 2 (two) times daily with a meal.   Dispense: 180 tablet; Refill: 1  2. Tobacco abuse Spent 4 minutes counseling on smoking cessation but she is not ready to quit at this time  3. HYPERTENSION, BENIGN ESSENTIAL Controlled Continue current regimen Counseled on blood pressure goal of less than 130/80, low-sodium, DASH diet, medication compliance, 150 minutes of moderate intensity exercise per week. Discussed medication compliance, adverse effects. - CMP14+EGFR - Lipid panel - amLODipine (NORVASC) 5 MG tablet; Take 1 tablet (5 mg total) by mouth daily.  Dispense: 90 tablet; Refill: 1 - losartan (COZAAR) 100 MG tablet; Take 1 tablet (100 mg total) by mouth daily.  Dispense: 90 tablet; Refill: 1 - metoprolol tartrate (LOPRESSOR) 25 MG tablet; Take 1 tablet (25 mg total) by mouth 2 (two) times daily.  Dispense: 180 tablet; Refill: 1  4. Coronary artery disease involving native coronary artery of native heart without angina pectoris Stable, asymptomatic Risk factor modification including smoking cessation Continue statin - aspirin 81 MG chewable tablet; Chew 1 tablet (81 mg total) by mouth daily after lunch.  Dispense: 90 tablet; Refill: 3 - atorvastatin (LIPITOR) 40 MG tablet; Take 1 tablet (40 mg total) by mouth daily.  Dispense: 90 tablet; Refill: 1 - clopidogrel (PLAVIX) 75 MG tablet; Take 1 tablet (75 mg total) by mouth daily. At lunch  Dispense: 90 tablet; Refill: 1  5. Need for pneumococcal vaccination - Pneumococcal polysaccharide vaccine 23-valent greater than or equal to 2yo subcutaneous/IM  6. Need for influenza vaccination - Flu Vaccine QUAD 36+ mos IM  7. Obesity (BMI 30.0-34.9) Counseled on caloric restriction, increasing physical activity and avoiding late meals    Meds ordered this encounter  Medications  . amLODipine (NORVASC) 5 MG tablet    Sig: Take 1 tablet (5 mg total) by mouth daily.    Dispense:  90 tablet    Refill:  1  . aspirin 81 MG chewable tablet    Sig: Chew 1 tablet (81 mg total) by  mouth daily after lunch.    Dispense:  90 tablet    Refill:  3  . atorvastatin (LIPITOR) 40 MG tablet    Sig: Take 1 tablet (40 mg total) by mouth daily.    Dispense:  90 tablet    Refill:  1  . clopidogrel (PLAVIX) 75 MG tablet    Sig: Take 1 tablet (75 mg total) by mouth daily. At lunch    Dispense:  90 tablet    Refill:  1  . losartan (COZAAR) 100 MG tablet    Sig: Take 1 tablet (100 mg total) by mouth daily.    Dispense:  90 tablet    Refill:  1  . metFORMIN (GLUCOPHAGE) 500 MG tablet    Sig: Take 1 tablet (500 mg total) by mouth 2 (two) times daily with a meal.    Dispense:  180 tablet    Refill:  1  . metoprolol tartrate (LOPRESSOR) 25 MG tablet    Sig: Take 1 tablet (25 mg total) by mouth 2 (two) times daily.    Dispense:  180 tablet    Refill:  1    Follow-up: Return in about 6 months (around 07/31/2019) for medical conditions.       Charlott Rakes, MD, FAAFP. [facility] and [place], Grandfather   01/30/2019, 1:32 PM

## 2019-01-31 LAB — LIPID PANEL
Chol/HDL Ratio: 4 ratio (ref 0.0–4.4)
Cholesterol, Total: 152 mg/dL (ref 100–199)
HDL: 38 mg/dL — ABNORMAL LOW (ref >39)
LDL Chol Calc (NIH): 96 mg/dL (ref 0–99)
Triglycerides: 95 mg/dL (ref 0–149)
VLDL Cholesterol Cal: 18 mg/dL (ref 5–40)

## 2019-01-31 LAB — CMP14+EGFR
ALT: 10 IU/L (ref 0–32)
AST: 12 IU/L (ref 0–40)
Albumin/Globulin Ratio: 1.4 (ref 1.2–2.2)
Albumin: 4.3 g/dL (ref 3.8–4.8)
Alkaline Phosphatase: 81 IU/L (ref 39–117)
BUN/Creatinine Ratio: 10 — ABNORMAL LOW (ref 12–28)
BUN: 8 mg/dL (ref 8–27)
Bilirubin Total: 0.3 mg/dL (ref 0.0–1.2)
CO2: 24 mmol/L (ref 20–29)
Calcium: 10.6 mg/dL — ABNORMAL HIGH (ref 8.7–10.3)
Chloride: 101 mmol/L (ref 96–106)
Creatinine, Ser: 0.79 mg/dL (ref 0.57–1.00)
GFR calc Af Amer: 91 mL/min/{1.73_m2} (ref >59)
GFR calc non Af Amer: 79 mL/min/{1.73_m2} (ref >59)
Globulin, Total: 3 g/dL (ref 1.5–4.5)
Glucose: 104 mg/dL — ABNORMAL HIGH (ref 65–99)
Potassium: 4.5 mmol/L (ref 3.5–5.2)
Sodium: 140 mmol/L (ref 134–144)
Total Protein: 7.3 g/dL (ref 6.0–8.5)

## 2019-02-13 MED FILL — CYCLOBENZAPRINE 5 MG TABLET: 5 | 10 days supply | Qty: 30 | Fill #3

## 2019-02-13 MED FILL — CITALOPRAM HBR 10 MG TABLET: 10 | 30 days supply | Qty: 30 | Fill #1

## 2019-02-24 ENCOUNTER — Ambulatory Visit (HOSPITAL_COMMUNITY): Payer: Self-pay | Admitting: Psychiatry

## 2019-02-24 DIAGNOSIS — I10 Essential (primary) hypertension: Secondary | ICD-10-CM | POA: Diagnosis not present

## 2019-02-24 DIAGNOSIS — I70203 Unspecified atherosclerosis of native arteries of extremities, bilateral legs: Secondary | ICD-10-CM | POA: Diagnosis not present

## 2019-02-24 DIAGNOSIS — I25118 Atherosclerotic heart disease of native coronary artery with other forms of angina pectoris: Secondary | ICD-10-CM | POA: Diagnosis not present

## 2019-02-24 DIAGNOSIS — R0600 Dyspnea, unspecified: Secondary | ICD-10-CM | POA: Diagnosis not present

## 2019-02-24 DIAGNOSIS — Z79899 Other long term (current) drug therapy: Secondary | ICD-10-CM | POA: Diagnosis not present

## 2019-02-24 DIAGNOSIS — Z1159 Encounter for screening for other viral diseases: Secondary | ICD-10-CM | POA: Diagnosis not present

## 2019-02-24 DIAGNOSIS — Z0001 Encounter for general adult medical examination with abnormal findings: Secondary | ICD-10-CM | POA: Diagnosis not present

## 2019-02-24 DIAGNOSIS — Z7189 Other specified counseling: Secondary | ICD-10-CM | POA: Diagnosis not present

## 2019-02-24 DIAGNOSIS — Z008 Encounter for other general examination: Secondary | ICD-10-CM | POA: Diagnosis not present

## 2019-03-02 ENCOUNTER — Ambulatory Visit (HOSPITAL_COMMUNITY): Payer: Self-pay | Admitting: Psychiatry

## 2019-03-14 ENCOUNTER — Other Ambulatory Visit (INDEPENDENT_AMBULATORY_CARE_PROVIDER_SITE_OTHER): Payer: Self-pay | Admitting: Orthopaedic Surgery

## 2019-03-14 DIAGNOSIS — G8929 Other chronic pain: Secondary | ICD-10-CM

## 2019-03-14 DIAGNOSIS — M545 Low back pain, unspecified: Secondary | ICD-10-CM

## 2019-03-14 MED FILL — CITALOPRAM HBR 10 MG TABLET: 10 | 30 days supply | Qty: 30 | Fill #2

## 2019-03-15 MED FILL — CYCLOBENZAPRINE 5 MG TABLET: 5 | 30 days supply | Qty: 90 | Fill #0

## 2019-03-17 ENCOUNTER — Other Ambulatory Visit: Payer: Self-pay

## 2019-03-17 ENCOUNTER — Ambulatory Visit (INDEPENDENT_AMBULATORY_CARE_PROVIDER_SITE_OTHER): Payer: Medicare HMO | Admitting: Psychiatry

## 2019-03-17 ENCOUNTER — Encounter (HOSPITAL_COMMUNITY): Payer: Self-pay | Admitting: Psychiatry

## 2019-03-17 ENCOUNTER — Ambulatory Visit (HOSPITAL_COMMUNITY): Payer: Self-pay | Admitting: Psychiatry

## 2019-03-17 DIAGNOSIS — F325 Major depressive disorder, single episode, in full remission: Secondary | ICD-10-CM

## 2019-03-17 MED ORDER — CITALOPRAM HYDROBROMIDE 10 MG PO TABS: 10.0000 mg | ORAL_TABLET | Freq: Every day | ORAL | 1 refills | Status: DC

## 2019-03-17 NOTE — Progress Notes (Signed)
5 view Patient ID: Vanessa Proctor, adult   DOB: 04-24-1955, 63 y.o.   MRN: [id number]  Psychiatric Initial Adult Assessment   Patient Identification: Vanessa Proctor MRN:  [id number] Date of Evaluation:  03/17/2019 Referral Source: Self-referred Chief Complaint: Weight loss  Visit Diagnosis: Bipolar disorder, type 1 Major depression remissio    Today the patient is doing very well.  Her mood is good.  She is off Depakote with no problems.  She does take Celexa 10 mg.  She denies daily depression.  She is sleeping and eating well.  She is very excited because she is now going to be moving into her own home.  She lives with her daughter at this time.  She is very excited because she wants to be independent.  Patient is handling the pandemic very well.  Patient is positive and optimistic.  She drinks no alcohol uses no drugs.  She denies chest pain or shortness of breath or any physical complaints.  Her health is good her finances are stable and her relations of some people are doing well.  She is functioning very well. PTSD Symptoms:   Past Psychiatric History: Psychiatric hospitalization at [facility] in 1999.  Previous Psychotropic Medications: Yes   Substance Abuse History in the last 12 months:  Yes.    Consequences of Substance Abuse:   Past Medical History:  Past Medical History:  Diagnosis Date  . Arthritis    knees  . Constipation last month  . Coronary artery disease   . Depression   . Drug abuse ([place])   . Hypertension   . Myocardial infarction [facility])    november 1st 2012  . Nausea & vomiting last month  . Rectal bleeding   . Stroke [facility])    november, 2012  . Tobacco abuse     Past Surgical History:  Procedure Laterality Date  . ABDOMINAL HYSTERECTOMY  1985   partial  . cataract surgery Left   . COLONOSCOPY N/A 04/28/2013   Procedure: COLONOSCOPY;  Surgeon: Beryle Beams, MD;  [facility];  Service: Endoscopy;  Laterality: N/A;  .  CORONARY ANGIOPLASTY WITH STENT PLACEMENT  2012   x 1  . FINGER FRACTURE SURGERY Left    reduction with  splint  . knot drained and removed from side of face  2007  . TONSILLECTOMY  1980    Family Psychiatric History:   Family History:  Family History  Problem Relation Age of Onset  . Cancer Mother        mass in abdomen  . Coronary artery disease Other   . Pancreatic cancer Sister   . Stroke Brother   . Heart attack Maternal Grandfather   . Hypertension Maternal Grandfather     Social History:   Social History   Socioeconomic History  . Marital status: Single    Spouse name: Not on file  . Number of children: Not on file  . Years of education: Not on file  . Highest education level: Not on file  Occupational History  . Not on file  Social Needs  . Financial resource strain: Not on file  . Food insecurity    Worry: Not on file    Inability: Not on file  . Transportation needs    Medical: Not on file    Non-medical: Not on file  Tobacco Use  . Smoking status: Current Every Day Smoker    Packs/day: 0.50    Years: 40.00  Pack years: 20.00    Types: Cigarettes  . Smokeless tobacco: Never Used  Substance and Sexual Activity  . Alcohol use: No    Alcohol/week: 0.0 standard drinks  . Drug use: Yes    Comment: marijuana occassionaly  . Sexual activity: Not Currently  Lifestyle  . Physical activity    Days per week: Not on file    Minutes per session: Not on file  . Stress: Not on file  Relationships  . Social Herbalist on phone: Not on file    Gets together: Not on file    Attends religious service: Not on file    Active member of club or organization: Not on file    Attends meetings of clubs or organizations: Not on file    Relationship status: Not on file  Other Topics Concern  . Not on file  Social History Narrative  . Not on file    Additional Social History:   Allergies:   Allergies  Allergen Reactions  . Bactrim Rash  . Sulfa  Antibiotics Rash    Metabolic Disorder Labs: Lab Results  Component Value Date   HGBA1C 6.1 01/30/2019   MPG 134 03/04/2015   MPG 128 (H) 03/09/2011   No results found for: PROLACTIN Lab Results  Component Value Date   CHOL 152 01/30/2019   TRIG 95 01/30/2019   HDL 38 (L) 01/30/2019   CHOLHDL 4.0 01/30/2019   VLDL 36 (H) 02/04/2016   LDLCALC 96 01/30/2019   [place] 73 03/17/2017     Current Medications: Current Outpatient Medications  Medication Sig Dispense Refill  . albuterol (VENTOLIN HFA) 108 (90 Base) MCG/ACT inhaler INHALE 2 PUFFS EVERY 6 HOURS AS NEEDED FOR WHEEZING OR SHORTNESS OF BREATH. 18 g 1  . amLODipine (NORVASC) 5 MG tablet Take 1 tablet (5 mg total) by mouth daily. 90 tablet 1  . aspirin 81 MG chewable tablet Chew 1 tablet (81 mg total) by mouth daily after lunch. 90 tablet 3  . atorvastatin (LIPITOR) 40 MG tablet Take 1 tablet (40 mg total) by mouth daily. 90 tablet 1  . citalopram (CELEXA) 10 MG tablet Take 1 tablet (10 mg total) by mouth daily. 90 tablet 1  . clopidogrel (PLAVIX) 75 MG tablet Take 1 tablet (75 mg total) by mouth daily. At lunch 90 tablet 1  . cyclobenzaprine (FLEXERIL) 5 MG tablet TAKE 1 TABLET (5 MG TOTAL) BY MOUTH 3 (THREE) TIMES DAILY AS NEEDED FOR MUSCLE SPASMS. 30 tablet 3  . Elastic Bandages & Supports (MEDICAL COMPRESSION STOCKINGS) MISC 1 Package by Does not apply route daily. Dispense 1 pair, to fit, wear daily, take off at night 1 each 0  . losartan (COZAAR) 100 MG tablet Take 1 tablet (100 mg total) by mouth daily. 90 tablet 1  . meloxicam (MOBIC) 7.5 MG tablet Take 1 tablet (7.5 mg total) by mouth daily. (Patient not taking: Reported on 11/08/2018) 30 tablet 1  . meloxicam (MOBIC) 7.5 MG tablet Take 1 tablet (7.5 mg total) by mouth 2 (two) times daily as needed for pain. (Patient not taking: Reported on 08/02/2018) 60 tablet 2  . metFORMIN (GLUCOPHAGE) 500 MG tablet Take 1 tablet (500 mg total) by mouth 2 (two) times daily with a  meal. 180 tablet 1  . metoprolol tartrate (LOPRESSOR) 25 MG tablet Take 1 tablet (25 mg total) by mouth 2 (two) times daily. 180 tablet 1  . nitroGLYCERIN (NITROSTAT) 0.4 MG SL tablet Place 1 tablet (0.4 mg  total) under the tongue every 5 (five) minutes as needed for chest pain. 90 tablet 3   No current facility-administered medications for this visit.     Neurologic: Headache: No Seizure: No Paresthesias:No  Musculoskeletal: Strength & Muscle Tone: within normal limits Gait & Station: normal Patient leans  Psychiatric Specialty Exam: ROS  There were no vitals taken for this visit.There is no height or weight on file to calculate BMI.  General Appearance: Casual  Eye Contact:  Good  Speech:  Clear and Coherent  Volume:  Normal  Mood:  Negative  Affect:  Appropriate  Thought Process:  Coherent  Orientation:  Full (Time, Place, and Person)  Thought Content:  WDL  Suicidal Thoughts:  No  Homicidal Thoughts:  No  Memory:  Negative  Judgement:  Good  Insight:  Good  Psychomotor Activity:  Normal  Concentration:  Good  Recall:  Good  Fund of Knowledge:Good  Language: Fair  Akathisia:  No  Handed:  Right  AIMS (if indicated):    Assets:  Desire for Improvement  ADL's:  Intact  Cognition: WNL  Sleep:     Treatment Plan Summary: 12/4/202010:24 AM  This patient is doing very well and her only diagnosis is perhaps major depression in remission.  She will continue taking Celexa at the appropriate dose of 10 mg.  This patient will be seen again in 6 months and at that time we will attempt to clarify her diagnosis and determine if in fact she needs to get Celexa from this center or from her primary care doctor.  She is functioning very well.

## 2019-03-21 ENCOUNTER — Other Ambulatory Visit: Payer: Self-pay

## 2019-03-21 ENCOUNTER — Ambulatory Visit
Admission: RE | Admit: 2019-03-21 | Discharge: 2019-03-21 | Disposition: A | Discharge status: Home / Self Care | Payer: Medicare HMO | Source: Ambulatory Visit | Attending: Family | Admitting: Family

## 2019-03-21 ENCOUNTER — Other Ambulatory Visit: Payer: Self-pay | Admitting: Family Medicine

## 2019-03-21 DIAGNOSIS — R0600 Dyspnea, unspecified: Secondary | ICD-10-CM

## 2019-03-24 DIAGNOSIS — I129 Hypertensive chronic kidney disease with stage 1 through stage 4 chronic kidney disease, or unspecified chronic kidney disease: Secondary | ICD-10-CM | POA: Diagnosis not present

## 2019-03-24 DIAGNOSIS — E119 Type 2 diabetes mellitus without complications: Secondary | ICD-10-CM | POA: Diagnosis not present

## 2019-03-24 DIAGNOSIS — J329 Chronic sinusitis, unspecified: Secondary | ICD-10-CM | POA: Diagnosis not present

## 2019-03-24 DIAGNOSIS — N183 Chronic kidney disease, stage 3 unspecified: Secondary | ICD-10-CM | POA: Diagnosis not present

## 2019-03-24 DIAGNOSIS — Z8619 Personal history of other infectious and parasitic diseases: Secondary | ICD-10-CM | POA: Diagnosis not present

## 2019-03-24 DIAGNOSIS — Z008 Encounter for other general examination: Secondary | ICD-10-CM | POA: Diagnosis not present

## 2019-04-12 MED FILL — CITALOPRAM HBR 10 MG TABLET: 10 | 30 days supply | Qty: 30 | Fill #3

## 2019-05-10 MED FILL — CITALOPRAM HBR 10 MG TABLET: 10 | 30 days supply | Qty: 30 | Fill #4

## 2019-05-19 ENCOUNTER — Other Ambulatory Visit: Payer: Self-pay | Admitting: Physician Assistant

## 2019-05-19 ENCOUNTER — Telehealth: Payer: Self-pay | Admitting: Orthopaedic Surgery

## 2019-05-19 DIAGNOSIS — M545 Low back pain, unspecified: Secondary | ICD-10-CM

## 2019-05-19 DIAGNOSIS — G8929 Other chronic pain: Secondary | ICD-10-CM

## 2019-05-19 MED ORDER — CYCLOBENZAPRINE HCL 5 MG PO TABS: 5.0000 mg | ORAL_TABLET | Freq: Three times a day (TID) | ORAL | 3 refills | Status: DC | PRN

## 2019-05-19 MED FILL — CYCLOBENZAPRINE 5 MG TABLET: 5 | 10 days supply | Qty: 30 | Fill #0

## 2019-05-19 NOTE — Telephone Encounter (Signed)
Please advise 

## 2019-05-19 NOTE — Telephone Encounter (Signed)
I called patient and advised. She wanted to make appt for next week. Appt made.

## 2019-05-19 NOTE — Telephone Encounter (Signed)
Patient called and requested a refill of Flexeril. Stated difficult to stand up and walk.  Please call patient at [phone]

## 2019-05-19 NOTE — Telephone Encounter (Signed)
I just sent in rx.  Have not seen her in almost a year so if still hurting should probably come back in.

## 2019-05-22 MED FILL — BENZONATATE 100 MG CAPS: 100 | 3 days supply | Qty: 10 | Fill #0

## 2019-05-24 ENCOUNTER — Other Ambulatory Visit: Payer: Self-pay

## 2019-05-24 ENCOUNTER — Ambulatory Visit (INDEPENDENT_AMBULATORY_CARE_PROVIDER_SITE_OTHER): Payer: Medicare HMO

## 2019-05-24 ENCOUNTER — Ambulatory Visit: Payer: Medicare HMO | Admitting: Orthopaedic Surgery

## 2019-05-24 ENCOUNTER — Encounter: Payer: Self-pay | Admitting: Orthopaedic Surgery

## 2019-05-24 DIAGNOSIS — M76822 Posterior tibial tendinitis, left leg: Secondary | ICD-10-CM

## 2019-05-24 MED ORDER — DICLOFENAC SODIUM 1 % EX GEL: 2.0000 g | Freq: Two times a day (BID) | CUTANEOUS | 0 refills | Status: DC

## 2019-05-24 MED FILL — DICLOFENAC SODIUM 1 % GEL: 1 | 24 days supply | Qty: 100 | Fill #0
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
## 2019-05-30 ENCOUNTER — Ambulatory Visit (INDEPENDENT_AMBULATORY_CARE_PROVIDER_SITE_OTHER): Payer: Medicare HMO | Admitting: Physical Therapy

## 2019-05-30 ENCOUNTER — Encounter: Payer: Self-pay | Admitting: Physical Therapy

## 2019-05-30 ENCOUNTER — Other Ambulatory Visit: Payer: Self-pay

## 2019-05-30 ENCOUNTER — Telehealth: Payer: Self-pay

## 2019-05-30 DIAGNOSIS — M25672 Stiffness of left ankle, not elsewhere classified: Secondary | ICD-10-CM | POA: Diagnosis not present

## 2019-05-30 DIAGNOSIS — M25572 Pain in left ankle and joints of left foot: Secondary | ICD-10-CM | POA: Diagnosis not present

## 2019-05-30 DIAGNOSIS — R2689 Other abnormalities of gait and mobility: Secondary | ICD-10-CM

## 2019-05-30 DIAGNOSIS — R262 Difficulty in walking, not elsewhere classified: Secondary | ICD-10-CM | POA: Diagnosis not present

## 2019-05-30 NOTE — Patient Instructions (Signed)
Access Code: [email or web address]  URL: [email or web address]  Date: 05/30/2019  Prepared by: Faustino Congress   Exercises Seated Ankle Pumps on Table - 15 reps - 1 sets - 2x daily - 7x weekly Seated Ankle Circles - 15 reps - 1 sets - 2x daily - 7x weekly Seated Calf Stretch with Strap - 3 reps - 1 sets - 30 sec hold - 3x daily - 7x weekly Patient Education Trigger Point Dry Needling

## 2019-05-30 NOTE — Telephone Encounter (Signed)
Called patient / Left message to call back. Phone number and contact info provided.
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
## 2019-06-01 ENCOUNTER — Encounter: Payer: Medicare HMO | Admitting: Rehabilitative and Restorative Service Providers"

## 2019-06-09 ENCOUNTER — Ambulatory Visit: Payer: Medicare HMO | Attending: Internal Medicine

## 2019-06-09 DIAGNOSIS — Z23 Encounter for immunization: Secondary | ICD-10-CM | POA: Insufficient documentation

## 2019-06-09 NOTE — Progress Notes (Signed)
   Covid-19 Vaccination Clinic  Name:  Vanessa Proctor    MRN: [id number] DOB: 1955/07/12  06/09/2019  [name] was observed post Covid-19 immunization for 15 minutes without incidence. She was provided with Vaccine Information Sheet and instruction to access the V-Safe system.   [name] was instructed to call 911 with any severe reactions post vaccine: Marland Kitchen Difficulty breathing  . Swelling of your face and throat  . A fast heartbeat  . A bad rash all over your body  . Dizziness and weakness    Immunizations Administered    Name Date Dose VIS Date Route   Pfizer COVID-19 Vaccine 06/09/2019  1:42 PM 0.3 mL 03/24/2019 Intramuscular   Manufacturer: [place]   Lot: [id number]   [place]: [id number]

## 2019-06-13 ENCOUNTER — Encounter: Payer: Medicare HMO | Admitting: Physical Therapy

## 2019-06-13 ENCOUNTER — Telehealth: Payer: Self-pay | Admitting: Family Medicine

## 2019-06-13 DIAGNOSIS — I2510 Atherosclerotic heart disease of native coronary artery without angina pectoris: Secondary | ICD-10-CM

## 2019-06-13 DIAGNOSIS — R7303 Prediabetes: Secondary | ICD-10-CM

## 2019-06-13 DIAGNOSIS — I10 Essential (primary) hypertension: Secondary | ICD-10-CM

## 2019-06-13 MED ORDER — LOSARTAN POTASSIUM 100 MG PO TABS: 100.0000 mg | ORAL_TABLET | Freq: Every day | ORAL | 0 refills | Status: DC

## 2019-06-13 MED ORDER — METOPROLOL TARTRATE 25 MG PO TABS: 25.0000 mg | ORAL_TABLET | Freq: Two times a day (BID) | ORAL | 0 refills | Status: DC

## 2019-06-13 MED ORDER — METFORMIN HCL 500 MG PO TABS: 500.0000 mg | ORAL_TABLET | Freq: Two times a day (BID) | ORAL | 0 refills | Status: AC

## 2019-06-13 MED ORDER — AMLODIPINE BESYLATE 5 MG PO TABS: 5.0000 mg | ORAL_TABLET | Freq: Every day | ORAL | 0 refills | Status: DC

## 2019-06-13 MED ORDER — ALBUTEROL SULFATE HFA 108 (90 BASE) MCG/ACT IN AERS: INHALATION_SPRAY | RESPIRATORY_TRACT | 0 refills | Status: DC

## 2019-06-13 MED ORDER — CLOPIDOGREL BISULFATE 75 MG PO TABS: 75.0000 mg | ORAL_TABLET | Freq: Every day | ORAL | 0 refills | Status: DC

## 2019-06-13 MED FILL — CITALOPRAM HBR 10 MG TABLET: 10 | 90 days supply | Qty: 90 | Fill #0

## 2019-06-13 MED FILL — CYCLOBENZAPRINE 5 MG TABLET: 5 | 10 days supply | Qty: 30 | Fill #1

## 2019-06-13 NOTE — Telephone Encounter (Signed)
Rxs sent

## 2019-06-13 NOTE — Telephone Encounter (Signed)
1) Medication(s) Requested (by name): -albuterol (VENTOLIN HFA) 108 (90 Base) MCG/ACT inhaler  -amLODipine (NORVASC) 5 MG tablet  -clopidogrel (PLAVIX) 75 MG tablet  -losartan (COZAAR) 100 MG tablet  -metFORMIN (GLUCOPHAGE) 500 MG tablet  -metoprolol tartrate (LOPRESSOR) 25 MG tablet  -citalopram (CELEXA) 10 MG tablet   2) Pharmacy of Choice: -[place], [place]

## 2019-06-15 ENCOUNTER — Encounter: Payer: Self-pay | Admitting: Physical Therapy

## 2019-06-15 ENCOUNTER — Other Ambulatory Visit: Payer: Self-pay

## 2019-06-15 ENCOUNTER — Ambulatory Visit: Payer: Medicare HMO | Admitting: Physical Therapy

## 2019-06-15 DIAGNOSIS — R2689 Other abnormalities of gait and mobility: Secondary | ICD-10-CM

## 2019-06-15 DIAGNOSIS — M25572 Pain in left ankle and joints of left foot: Secondary | ICD-10-CM

## 2019-06-15 DIAGNOSIS — R293 Abnormal posture: Secondary | ICD-10-CM

## 2019-06-15 DIAGNOSIS — R262 Difficulty in walking, not elsewhere classified: Secondary | ICD-10-CM | POA: Diagnosis not present

## 2019-06-15 DIAGNOSIS — M25672 Stiffness of left ankle, not elsewhere classified: Secondary | ICD-10-CM | POA: Diagnosis not present

## 2019-06-15 NOTE — Patient Instructions (Signed)
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
## 2019-06-19 ENCOUNTER — Other Ambulatory Visit: Payer: Self-pay

## 2019-06-19 ENCOUNTER — Encounter: Payer: Self-pay | Admitting: Physical Therapy

## 2019-06-19 ENCOUNTER — Ambulatory Visit (INDEPENDENT_AMBULATORY_CARE_PROVIDER_SITE_OTHER): Payer: Medicare HMO | Admitting: Physical Therapy

## 2019-06-19 DIAGNOSIS — M25672 Stiffness of left ankle, not elsewhere classified: Secondary | ICD-10-CM | POA: Diagnosis not present

## 2019-06-19 DIAGNOSIS — M25572 Pain in left ankle and joints of left foot: Secondary | ICD-10-CM

## 2019-06-19 DIAGNOSIS — R262 Difficulty in walking, not elsewhere classified: Secondary | ICD-10-CM | POA: Diagnosis not present

## 2019-06-19 DIAGNOSIS — R2689 Other abnormalities of gait and mobility: Secondary | ICD-10-CM

## 2019-06-19 DIAGNOSIS — R293 Abnormal posture: Secondary | ICD-10-CM
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
## 2019-06-21 ENCOUNTER — Ambulatory Visit: Payer: Medicare HMO | Admitting: Physical Therapy

## 2019-06-21 ENCOUNTER — Other Ambulatory Visit: Payer: Self-pay

## 2019-06-21 ENCOUNTER — Encounter: Payer: Self-pay | Admitting: Physical Therapy

## 2019-06-21 DIAGNOSIS — M25572 Pain in left ankle and joints of left foot: Secondary | ICD-10-CM

## 2019-06-21 DIAGNOSIS — R293 Abnormal posture: Secondary | ICD-10-CM

## 2019-06-21 DIAGNOSIS — R2689 Other abnormalities of gait and mobility: Secondary | ICD-10-CM

## 2019-06-21 DIAGNOSIS — M25672 Stiffness of left ankle, not elsewhere classified: Secondary | ICD-10-CM

## 2019-06-21 DIAGNOSIS — R262 Difficulty in walking, not elsewhere classified: Secondary | ICD-10-CM
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
## 2019-06-27 ENCOUNTER — Other Ambulatory Visit: Payer: Self-pay

## 2019-06-27 ENCOUNTER — Ambulatory Visit (INDEPENDENT_AMBULATORY_CARE_PROVIDER_SITE_OTHER): Payer: Medicare HMO | Admitting: Rehabilitative and Restorative Service Providers"

## 2019-06-27 ENCOUNTER — Encounter: Payer: Self-pay | Admitting: Rehabilitative and Restorative Service Providers"

## 2019-06-27 DIAGNOSIS — M25572 Pain in left ankle and joints of left foot: Secondary | ICD-10-CM

## 2019-06-27 DIAGNOSIS — M25672 Stiffness of left ankle, not elsewhere classified: Secondary | ICD-10-CM | POA: Diagnosis not present

## 2019-06-27 DIAGNOSIS — R293 Abnormal posture: Secondary | ICD-10-CM

## 2019-06-27 DIAGNOSIS — R262 Difficulty in walking, not elsewhere classified: Secondary | ICD-10-CM | POA: Diagnosis not present

## 2019-06-27 DIAGNOSIS — R2689 Other abnormalities of gait and mobility: Secondary | ICD-10-CM | POA: Diagnosis not present
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
## 2019-06-29 ENCOUNTER — Encounter: Payer: Medicare HMO | Admitting: Physical Therapy

## 2019-07-01 ENCOUNTER — Other Ambulatory Visit: Payer: Self-pay | Admitting: Family Medicine

## 2019-07-05 ENCOUNTER — Ambulatory Visit: Payer: Medicare HMO | Attending: Internal Medicine

## 2019-07-05 DIAGNOSIS — Z23 Encounter for immunization: Secondary | ICD-10-CM

## 2019-07-05 NOTE — Progress Notes (Signed)
   Covid-19 Vaccination Clinic  Name:  Vanessa Proctor    MRN: [id number] DOB: 1956/02/14  07/05/2019  Vanessa Proctor was observed post Covid-19 immunization for 15 minutes without incident. She was provided with Vaccine Information Sheet and instruction to access the V-Safe system.   Vanessa Proctor was instructed to call 911 with any severe reactions post vaccine: Marland Kitchen Difficulty breathing  . Swelling of face and throat  . A fast heartbeat  . A bad rash all over body  . Dizziness and weakness   Immunizations Administered    Name Date Dose VIS Date Route   Pfizer COVID-19 Vaccine 07/05/2019  2:50 PM 0.3 mL 03/24/2019 Intramuscular   Manufacturer: [place]   Lot: [id number]   [place]: [id number]

## 2019-07-18 ENCOUNTER — Encounter: Payer: Medicare HMO | Admitting: Physical Therapy

## 2019-07-19 ENCOUNTER — Other Ambulatory Visit: Payer: Self-pay

## 2019-07-19 ENCOUNTER — Encounter: Payer: Self-pay | Admitting: Physical Therapy

## 2019-07-19 ENCOUNTER — Ambulatory Visit: Payer: Medicare HMO | Admitting: Physical Therapy

## 2019-07-19 ENCOUNTER — Encounter: Payer: Self-pay | Admitting: Orthopaedic Surgery

## 2019-07-19 ENCOUNTER — Ambulatory Visit (INDEPENDENT_AMBULATORY_CARE_PROVIDER_SITE_OTHER): Payer: Medicare HMO | Admitting: Orthopaedic Surgery

## 2019-07-19 DIAGNOSIS — M76822 Posterior tibial tendinitis, left leg: Secondary | ICD-10-CM | POA: Diagnosis not present

## 2019-07-19 DIAGNOSIS — R262 Difficulty in walking, not elsewhere classified: Secondary | ICD-10-CM | POA: Diagnosis not present

## 2019-07-19 DIAGNOSIS — M25572 Pain in left ankle and joints of left foot: Secondary | ICD-10-CM

## 2019-07-19 DIAGNOSIS — M25672 Stiffness of left ankle, not elsewhere classified: Secondary | ICD-10-CM | POA: Diagnosis not present

## 2019-07-19 DIAGNOSIS — R2689 Other abnormalities of gait and mobility: Secondary | ICD-10-CM | POA: Diagnosis not present

## 2019-07-19 DIAGNOSIS — R293 Abnormal posture: Secondary | ICD-10-CM

## 2019-07-19 MED ORDER — TRAMADOL HCL 50 MG PO TABS: 50.0000 mg | ORAL_TABLET | Freq: Two times a day (BID) | ORAL | 1 refills | Status: DC | PRN
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
## 2019-07-21 ENCOUNTER — Other Ambulatory Visit: Payer: Self-pay | Admitting: Family Medicine

## 2019-08-07 ENCOUNTER — Other Ambulatory Visit: Payer: Self-pay | Admitting: Family Medicine

## 2019-08-07 DIAGNOSIS — I2510 Atherosclerotic heart disease of native coronary artery without angina pectoris: Secondary | ICD-10-CM

## 2019-08-10 MED FILL — CYCLOBENZAPRINE 5 MG TABLET: 5 | 10 days supply | Qty: 30 | Fill #2

## 2019-09-05 MED FILL — CITALOPRAM HBR 10 MG TABLET: 10 | 90 days supply | Qty: 90 | Fill #1

## 2019-09-06 ENCOUNTER — Other Ambulatory Visit: Payer: Self-pay | Admitting: Family Medicine

## 2019-09-06 DIAGNOSIS — I2510 Atherosclerotic heart disease of native coronary artery without angina pectoris: Secondary | ICD-10-CM

## 2019-09-06 DIAGNOSIS — I10 Essential (primary) hypertension: Secondary | ICD-10-CM

## 2019-09-08 ENCOUNTER — Other Ambulatory Visit: Payer: Self-pay | Admitting: General Practice

## 2019-09-08 DIAGNOSIS — E2839 Other primary ovarian failure: Secondary | ICD-10-CM

## 2019-09-14 ENCOUNTER — Other Ambulatory Visit: Payer: Self-pay | Admitting: Nurse Practitioner

## 2019-09-20 ENCOUNTER — Ambulatory Visit (HOSPITAL_COMMUNITY): Payer: Medicare HMO | Admitting: Psychiatry

## 2019-10-05 ENCOUNTER — Telehealth (INDEPENDENT_AMBULATORY_CARE_PROVIDER_SITE_OTHER): Payer: Medicare HMO | Admitting: Psychiatry

## 2019-10-05 ENCOUNTER — Other Ambulatory Visit: Payer: Self-pay

## 2019-10-05 ENCOUNTER — Other Ambulatory Visit: Payer: Self-pay | Admitting: Family Medicine

## 2019-10-05 DIAGNOSIS — F325 Major depressive disorder, single episode, in full remission: Secondary | ICD-10-CM | POA: Diagnosis not present

## 2019-10-05 DIAGNOSIS — I2510 Atherosclerotic heart disease of native coronary artery without angina pectoris: Secondary | ICD-10-CM

## 2019-10-05 MED ORDER — CITALOPRAM HYDROBROMIDE 10 MG PO TABS: 10.0000 mg | ORAL_TABLET | Freq: Every day | ORAL | 2 refills | Status: DC
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
## 2019-10-09 ENCOUNTER — Other Ambulatory Visit: Payer: Self-pay | Admitting: Family Medicine

## 2019-10-09 DIAGNOSIS — I10 Essential (primary) hypertension: Secondary | ICD-10-CM

## 2019-10-09 DIAGNOSIS — I2510 Atherosclerotic heart disease of native coronary artery without angina pectoris: Secondary | ICD-10-CM

## 2019-10-11 ENCOUNTER — Other Ambulatory Visit: Payer: Self-pay | Admitting: General Practice

## 2019-10-11 DIAGNOSIS — Z1231 Encounter for screening mammogram for malignant neoplasm of breast: Secondary | ICD-10-CM

## 2019-10-30 ENCOUNTER — Other Ambulatory Visit: Payer: Self-pay | Admitting: Family Medicine

## 2019-10-30 DIAGNOSIS — I2510 Atherosclerotic heart disease of native coronary artery without angina pectoris: Secondary | ICD-10-CM

## 2019-11-09 ENCOUNTER — Other Ambulatory Visit: Payer: Self-pay

## 2019-11-09 ENCOUNTER — Other Ambulatory Visit: Payer: Medicare HMO

## 2019-11-09 ENCOUNTER — Ambulatory Visit
Admission: RE | Admit: 2019-11-09 | Discharge: 2019-11-09 | Disposition: A | Discharge status: Home / Self Care | Payer: Medicare HMO | Source: Ambulatory Visit | Attending: General Practice | Admitting: General Practice

## 2019-11-09 DIAGNOSIS — E2839 Other primary ovarian failure: Secondary | ICD-10-CM

## 2019-11-09 MED FILL — CYCLOBENZAPRINE 5 MG TABLET: 5 | 10 days supply | Qty: 30 | Fill #3

## 2019-11-13 ENCOUNTER — Telehealth: Payer: Self-pay | Admitting: Family Medicine

## 2019-11-13 DIAGNOSIS — G8929 Other chronic pain: Secondary | ICD-10-CM

## 2019-11-13 DIAGNOSIS — M545 Low back pain, unspecified: Secondary | ICD-10-CM

## 2019-11-13 NOTE — Telephone Encounter (Signed)
Requested medication (s) are due for refill today - unknown  Requested medication (s) are on the active medication list -yes  Future visit scheduled -no  Last refill: 05/19/19 #30 3 RF  Notes to clinic: Request for non delegated Rx- from outside provider  Requested Prescriptions  Pending Prescriptions Disp Refills   cyclobenzaprine (FLEXERIL) 5 MG tablet 30 tablet 3    Sig: Take 1 tablet (5 mg total) by mouth 3 (three) times daily as needed for muscle spasms.      Not Delegated - Analgesics:  Muscle Relaxants Failed - 11/13/2019  4:15 PM      Failed - This refill cannot be delegated      Failed - Valid encounter within last 6 months    Recent Outpatient Visits           9 months ago Prediabetes   [place], MD   1 year ago HYPERTENSION, [place] Charlott Rakes, MD   1 year ago Unilateral primary osteoarthritis, right knee   [place], Charlane Ferretti, MD   1 year ago Acute pain of right knee   [place], [place], [place]   2 years ago Bipolar I disorder [facility])   [place], Charlane Ferretti, MD                  Requested Prescriptions  Pending Prescriptions Disp Refills   cyclobenzaprine (FLEXERIL) 5 MG tablet 30 tablet 3    Sig: Take 1 tablet (5 mg total) by mouth 3 (three) times daily as needed for muscle spasms.      Not Delegated - Analgesics:  Muscle Relaxants Failed - 11/13/2019  4:15 PM      Failed - This refill cannot be delegated      Failed - Valid encounter within last 6 months    Recent Outpatient Visits           9 months ago Prediabetes   [place], MD   1 year ago HYPERTENSION, [place] Charlott Rakes, MD   1 year ago Unilateral primary osteoarthritis,  right knee   [place], [place], MD   1 year ago Acute pain of right knee   [place], [place]   2 years ago Bipolar I disorder [facility])   [facility] Charlott Rakes, MD

## 2019-11-13 NOTE — Telephone Encounter (Signed)
Medication Refill - Medication: cyclobenzaprine (FLEXERIL) 5 MG tablet   Preferred Pharmacy (with phone number or street name):  [facility], [address] Phone:  [phone]  Fax:  [phone]       Agent: Please be advised that RX refills may take up to 3 business days. We ask that you follow-up with your pharmacy.

## 2019-11-15 ENCOUNTER — Other Ambulatory Visit: Payer: Self-pay

## 2019-11-15 ENCOUNTER — Ambulatory Visit
Admission: RE | Admit: 2019-11-15 | Discharge: 2019-11-15 | Disposition: A | Discharge status: Home / Self Care | Payer: Medicare HMO | Source: Ambulatory Visit | Attending: General Practice | Admitting: General Practice

## 2019-11-15 DIAGNOSIS — Z1231 Encounter for screening mammogram for malignant neoplasm of breast: Secondary | ICD-10-CM

## 2019-11-24 NOTE — Telephone Encounter (Signed)
Patient is calling to check on the status of this medication refill CB- [phone]

## 2019-11-24 NOTE — Telephone Encounter (Addendum)
Patient called, no answer, no voicemail set up. Appointment needed as to why refill was initially denied.

## 2019-11-24 NOTE — Telephone Encounter (Signed)
Attempted to call patient at number provided-VM not set up- unable to leave call back message. (Patient is overdue for 6 month follow up and that may be why RF denied.)

## 2020-01-05 ENCOUNTER — Other Ambulatory Visit: Payer: Self-pay | Admitting: Family Medicine

## 2020-01-05 DIAGNOSIS — I2510 Atherosclerotic heart disease of native coronary artery without angina pectoris: Secondary | ICD-10-CM

## 2020-01-05 NOTE — Telephone Encounter (Signed)
Requested Prescriptions  Pending Prescriptions Disp Refills  . aspirin 81 MG chewable tablet [Pharmacy Med Name: ASPIRIN 81 MG Tablet Chewable] 90 tablet 0    Sig: CHEW AND SWALLOW 1 TABLET (81 MG TOTAL) BY MOUTH DAILY AFTER LUNCH.     Analgesics:  NSAIDS - aspirin Passed - 01/05/2020  3:55 PM      Passed - Patient is not pregnant      Passed - Valid encounter within last 12 months    Recent Outpatient Visits          11 months ago Prediabetes   [place], MD   1 year ago HYPERTENSION, [place] Charlott Rakes, MD   1 year ago Unilateral primary osteoarthritis, right knee   [place], [place], MD   1 year ago Acute pain of right knee   [place], [place]   2 years ago Bipolar I disorder [facility])   [place] [facility] Charlott Rakes, MD

## 2020-01-12 ENCOUNTER — Other Ambulatory Visit: Payer: Self-pay | Admitting: Family Medicine

## 2020-01-12 DIAGNOSIS — I10 Essential (primary) hypertension: Secondary | ICD-10-CM

## 2020-01-12 DIAGNOSIS — I2510 Atherosclerotic heart disease of native coronary artery without angina pectoris: Secondary | ICD-10-CM

## 2020-01-12 NOTE — Telephone Encounter (Signed)
Refill requests for Metoprolol and Clopidogrel; last refils and visit 2020; attempted to contact pt; messagel states voicemail is not set up; unable to leave message; will route to office for final disposition   Requested medication (s) are due for refill today: Metoprolol, yes   Requested medication (s) are on the active medication list: yes  Last refill:  06/15/19  Future visit scheduled: no  Notes to clinic:  no visit or refill in last 12 months  Requested medication (s) are due for refill today: Clopidogrel, yes   Requested medication (s) are on the active medication list: yes  Last refill:  05/05/19  Future visit scheduled: no  Notes to clinic:  no visit or refill in last 12 months     Requested Prescriptions  Pending Prescriptions Disp Refills   metoprolol tartrate (LOPRESSOR) 25 MG tablet [Pharmacy Med Name: METOPROLOL TARTRATE 25 MG Tablet] 180 tablet 0    Sig: TAKE 1 TABLET TWICE DAILY      Cardiovascular:  Beta Blockers Failed - 01/12/2020 10:13 AM      Failed - Valid encounter within last 6 months    Recent Outpatient Visits           11 months ago Prediabetes   [place], [place]   1 year ago HYPERTENSION, [place], Charlane Ferretti, MD   1 year ago Unilateral primary osteoarthritis, right knee   [place], Enobong, MD   1 year ago Acute pain of right knee   [facility] [place], [place], [place]   2 years ago Bipolar I disorder [facility])   [place], [place], MD              Passed - Last BP in normal range    BP Readings from Last 1 Encounters:  01/30/19 131/81          Passed - Last Heart Rate in normal range    Pulse Readings from Last 1 Encounters:  01/30/19 81            clopidogrel (PLAVIX) 75 MG tablet [Pharmacy Med Name: CLOPIDOGREL 75 MG  Tablet] 90 tablet     Sig: TAKE 1 TABLET EVERY DAY AT LUNCH      Hematology: Antiplatelets - clopidogrel Failed - 01/12/2020 10:13 AM      Failed - Evaluate AST, ALT within 2 months of therapy initiation.      Failed - HCT in normal range and within 180 days    HCT  Date Value Ref Range Status  06/10/2015 36.8 36 - 46 % Final          Failed - HGB in normal range and within 180 days    Hemoglobin  Date Value Ref Range Status  06/10/2015 12.2 12.0 - 15.0 g/dL Final          Failed - PLT in normal range and within 180 days    Platelets  Date Value Ref Range Status  06/10/2015 222 150 - 400 K/uL Final          Failed - Valid encounter within last 6 months    Recent Outpatient Visits           11 months ago Prediabetes   [place], Enobong, MD   1 year ago HYPERTENSION, BENIGN ESSENTIAL   [facility]  [address], [place], MD   1 year ago Unilateral primary osteoarthritis, right knee   [place], Dwight, MD   1 year ago Acute pain of right knee   [place], [place], [place]   2 years ago Bipolar I disorder [facility])   [place], Charlane Ferretti, MD              Passed - ALT in normal range and within 360 days    ALT  Date Value Ref Range Status  01/30/2019 10 0 - 32 IU/L Final  04/01/2016 33 (H) 6 - 29 U/L Final          Passed - AST in normal range and within 360 days    AST  Date Value Ref Range Status  01/30/2019 12 0 - 40 IU/L Final

## 2020-02-01 ENCOUNTER — Ambulatory Visit: Payer: Self-pay | Admitting: Cardiology

## 2020-02-07 ENCOUNTER — Other Ambulatory Visit: Payer: Self-pay

## 2020-02-07 ENCOUNTER — Telehealth (INDEPENDENT_AMBULATORY_CARE_PROVIDER_SITE_OTHER): Payer: Medicare HMO | Admitting: Psychiatry

## 2020-02-07 DIAGNOSIS — F3342 Major depressive disorder, recurrent, in full remission: Secondary | ICD-10-CM

## 2020-02-07 MED ORDER — CITALOPRAM HYDROBROMIDE 10 MG PO TABS: 10.0000 mg | ORAL_TABLET | Freq: Every day | ORAL | 2 refills | Status: DC
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
## 2020-02-12 ENCOUNTER — Ambulatory Visit: Payer: Self-pay | Admitting: Cardiology

## 2020-02-15 ENCOUNTER — Encounter: Payer: Self-pay | Admitting: Cardiology

## 2020-02-15 ENCOUNTER — Ambulatory Visit: Payer: Medicare HMO | Admitting: Cardiology

## 2020-02-15 ENCOUNTER — Other Ambulatory Visit: Payer: Self-pay

## 2020-02-15 VITALS — BP 146/82 | HR 76 | Ht 60.0 in | Wt 152.0 lb

## 2020-02-15 DIAGNOSIS — R0609 Other forms of dyspnea: Secondary | ICD-10-CM

## 2020-02-15 DIAGNOSIS — E7800 Pure hypercholesterolemia, unspecified: Secondary | ICD-10-CM

## 2020-02-15 DIAGNOSIS — I2510 Atherosclerotic heart disease of native coronary artery without angina pectoris: Secondary | ICD-10-CM

## 2020-02-15 DIAGNOSIS — E1159 Type 2 diabetes mellitus with other circulatory complications: Secondary | ICD-10-CM

## 2020-02-15 DIAGNOSIS — I10 Essential (primary) hypertension: Secondary | ICD-10-CM

## 2020-02-15 DIAGNOSIS — Z720 Tobacco use: Secondary | ICD-10-CM

## 2020-02-15 DIAGNOSIS — I252 Old myocardial infarction: Secondary | ICD-10-CM

## 2020-02-15 DIAGNOSIS — Z955 Presence of coronary angioplasty implant and graft: Secondary | ICD-10-CM

## 2020-02-15 MED ORDER — AMLODIPINE BESYLATE 10 MG PO TABS: 10.0000 mg | ORAL_TABLET | Freq: Every day | ORAL | Status: AC

## 2020-02-15 NOTE — Progress Notes (Signed)
Date:  02/15/2020   ID:  Vanessa Proctor, DOB February 11, 1956, MRN [id number]  PCP:  Andree Moro, DO  Cardiologist:  Rex Kras, DO, [facility] (established care 02/15/2020) Former Cardiology Providers: Dr. Loralie Champagne, Truitt Merle, RN, ANP-C  REASON FOR CONSULT: Hypertension and CAD.   REQUESTING PHYSICIAN:  Charlott Rakes, MD [place],  [place] [address]  Chief Complaint  Patient presents with  . Coronary Artery Disease  . Hypertension  . New Patient (Initial Visit)    HPI  Vanessa Proctor is a 64 y.o. female who presents to the office with a chief complaint of " hypertension and CAD management." Patient's past medical history and cardiovascular risk factors include: Hx of NSTEMI, CAD s/p PCI to RCA with DES, Hyperlipidemia, HTN, Smoking, Hx of CVA (11/12 right external capsule), Hx of vasovagal syncope, postmenopausal female, advanced age.  She is referred to the office at the request of Charlott Rakes, MD for evaluation of hypertension and CAD.  Patient was formally under the care of Dr. Loralie Champagne and his advanced nurse practitioner and was last seen by the practice in 2017.  Since then patient has not followed up with cardiology.  Therefore patient is requested to reestablish care with cardiology at the request of her PCP.  Patient denies any active chest pain and her dyspnea on exertion is chronic and stable.  She has history of coronary artery disease and status post PCI to the RCA with a drug-eluting stent in the past.  And her last stress test and echocardiogram were performed in 2016.  Patient's functional status remained relatively stable.  She has not required sublingual nitroglycerin tablets in the recent past.  She states that she used couple tablets back in summer 2021.  Unfortunately, despite her history of non-STEMI, coronary artery disease with prior PCI's, and CVA she continues to smoke approximately 10 cigarettes/day.  Patient is also  referred to the office for management of hypertension.  Patient does not check her home blood pressures at home.  Patient is office blood pressures are within acceptable range but not at goal.  Medications reconciled.  Patient is educated on the importance of a low-salt diet.  FUNCTIONAL STATUS: No structured exercise program or daily routine.   ALLERGIES: Allergies  Allergen Reactions  . Bactrim Rash  . Sulfa Antibiotics Rash    MEDICATION LIST PRIOR TO VISIT: Current Meds  Medication Sig  . albuterol (VENTOLIN HFA) 108 (90 Base) MCG/ACT inhaler INHALE 2 PUFFS EVERY 6 HOURS AS NEEDED FOR WHEEZING OR SHORTNESS OF BREATH. Must have office visit for refills  . amLODipine (NORVASC) 10 MG tablet Take 1 tablet (10 mg total) by mouth daily at 10 pm.  . aspirin 81 MG chewable tablet CHEW AND SWALLOW 1 TABLET (81 MG TOTAL) BY MOUTH DAILY AFTER LUNCH.  Marland Kitchen atorvastatin (LIPITOR) 40 MG tablet Take 1 tablet (40 mg total) by mouth daily. Must have office visit for refills  . citalopram (CELEXA) 10 MG tablet Take 1 tablet (10 mg total) by mouth daily.  . divalproex (DEPAKOTE ER) 250 MG 24 hr tablet Take 1 tablet by mouth daily.  Regino Schultze Bandages & Supports (MEDICAL COMPRESSION STOCKINGS) MISC 1 Package by Does not apply route daily. Dispense 1 pair, to fit, wear daily, take off at night  . losartan (COZAAR) 100 MG tablet TAKE 1 TABLET EVERY DAY  . meloxicam (MOBIC) 7.5 MG tablet Take 1 tablet (7.5 mg total) by mouth daily.  . metFORMIN (GLUCOPHAGE) 500  MG tablet Take 1 tablet (500 mg total) by mouth 2 (two) times daily with a meal.  . nitroGLYCERIN (NITROSTAT) 0.4 MG SL tablet DISSOLVE ONE TABLET UNDER THE TONGUE EVERY 5 MINUTES AS NEEDED FOR CHEST PAIN.  DO NOT EXCEED A TOTAL OF 3 DOSES IN 15 MINUTES  . [DISCONTINUED] amLODipine (NORVASC) 5 MG tablet TAKE 1 TABLET EVERY DAY  . [DISCONTINUED] clopidogrel (PLAVIX) 75 MG tablet Take 1 tablet (75 mg total) by mouth daily. Please schedule an appointment.   . [DISCONTINUED] metoprolol tartrate (LOPRESSOR) 25 MG tablet Take 1 tablet (25 mg total) by mouth 2 (two) times daily. Please schedule PCP appointment.     PAST MEDICAL HISTORY: Past Medical History:  Diagnosis Date  . Arthritis    knees  . Constipation last month  . Coronary artery disease   . Depression   . Drug abuse ([place])   . Hyperlipidemia   . Hypertension   . Myocardial infarction [facility])    november 1st 2012  . Nausea & vomiting last month  . Rectal bleeding   . Stroke [facility])    november, 2012  . Tobacco abuse     PAST SURGICAL HISTORY: Past Surgical History:  Procedure Laterality Date  . ABDOMINAL HYSTERECTOMY  1985   partial  . cataract surgery Left   . COLONOSCOPY N/A 04/28/2013   Procedure: COLONOSCOPY;  Surgeon: Beryle Beams, MD;  Location: [facility] ENDOSCOPY;  Service: Endoscopy;  Laterality: N/A;  . CORONARY ANGIOPLASTY WITH STENT PLACEMENT  2012   x 1  . FINGER FRACTURE SURGERY Left    reduction with  splint  . knot drained and removed from side of face  2007  . TONSILLECTOMY  1980    FAMILY HISTORY: The patient family history includes Breast cancer in her cousin; Cancer in her mother; Coronary artery disease in an other family member; Heart attack in her maternal grandfather; Hypertension in her maternal grandfather; Pancreatic cancer in her sister; Stroke in her brother.  SOCIAL HISTORY:  The patient  reports that she has been smoking cigarettes. She has a 20.00 pack-year smoking history. She has never used smokeless tobacco. She reports current drug use. She reports that she does not drink alcohol.  REVIEW OF SYSTEMS: Review of Systems  Constitutional: Negative for chills and fever.  HENT: Negative for hoarse voice and nosebleeds.   Eyes: Negative for discharge, double vision and pain.  Cardiovascular: Positive for dyspnea on exertion. Negative for chest pain, claudication, leg swelling, near-syncope, orthopnea, palpitations, paroxysmal nocturnal dyspnea  and syncope.  Respiratory: Negative for hemoptysis and shortness of breath.   Musculoskeletal: Positive for joint pain. Negative for muscle cramps and myalgias.  Gastrointestinal: Negative for abdominal pain, constipation, diarrhea, hematemesis, hematochezia, melena, nausea and vomiting.  Neurological: Negative for dizziness and light-headedness.    PHYSICAL EXAM: Vitals with BMI 02/15/2020 02/15/2020 01/30/2019  Height - _0  _1   Weight - 152 lbs 175 lbs 3 oz  BMI - 00.17 49.44  Systolic 967 591 638  Diastolic 82 89 81  Pulse 76 75 81  Some encounter information is confidential and restricted. Go to Review Flowsheets activity to see all data.    CONSTITUTIONAL: Well-developed and well-nourished. No acute distress.  SKIN: Skin is warm and dry. No rash noted. No cyanosis. No pallor. No jaundice HEAD: Normocephalic and atraumatic.  EYES: No scleral icterus MOUTH/THROAT: Moist oral membranes.  NECK: No JVD present. No thyromegaly noted. No carotid bruits  LYMPHATIC: No visible cervical adenopathy.  CHEST  Normal respiratory effort. No intercostal retractions  LUNGS: Clear to auscultation bilaterally.  No stridor. No wheezes. No rales.  CARDIOVASCULAR: Regular rate and rhythm, positive S1-S2, no murmurs rubs or gallops appreciated. ABDOMINAL: No apparent ascites.  EXTREMITIES: No peripheral edema  HEMATOLOGIC: No significant bruising NEUROLOGIC: Oriented to person, place, and time. Nonfocal. Normal muscle tone.  PSYCHIATRIC: Normal mood and affect. Normal behavior. Cooperative  CARDIAC DATABASE: EKG: 02/15/2020: Normal sinus rhythm, 71 bpm normal axis, left atrial enlargement, poor R wave progression, without underlying injury pattern.  Echocardiogram: 02/2015:  Left ventricle: The cavity size was normal. Wall thickness was normal. Systolic function was normal. The estimated ejection fraction was in the range of 60% to 65%. Wall motion was normal; there were no regional wall  motion abnormalities. Left ventricular diastolic function parameters were normal.  - Aortic valve: There was no stenosis.  - Mitral valve: There was trivial regurgitation.  - Right ventricle: The cavity size was normal. Systolic function was normal.  - Tricuspid valve: Peak RV-RA gradient (S): 20 mm Hg.  - Pulmonary arteries: PA peak pressure: 23 mm Hg (S).  - Inferior vena cava: The vessel was normal in size. The respirophasic diameter changes were in the normal range (>= 50%), consistent with normal central venous pressure.    Stress Testing: 11/2014:  Nuclear stress EF: 69%.  There was no ST segment deviation noted during stress.  This is a low risk study.  The left ventricular ejection fraction is hyperdynamic (>65%).   There was a fixed, small mild mid anteroseptal perfusion defect.  Given normal wall motion, I think that this is most likely attenuation.  No ischemia.  Low risk study.  Of note, frequent PVCs with exercise.   Heart Catheterization: 02/2011: LHC with EF 55-60%, basal inferior HK, 99% mRCA stenosis, 40% OM1.  Patient had Promus DES to RCA  Carotid duplex: 02/2015: Bilateral - 1% TO 39% ICA stenosis. Vertebral artery flow is antegrade.   Cardiac Monitor: 02/2015: NSR, rare PVCs, no significant arrhythmias.Marland Kitchen  LABORATORY DATA: CBC Latest Ref Rng & Units 06/10/2015 03/03/2015 03/22/2013  WBC 4.0 - 10.5 K/uL 4.4 6.6 5.8  Hemoglobin 12.0 - 15.0 g/dL 12.2 13.4 13.1  Hematocrit 36.0 - 46.0 % 36.8 40.5 38.3  Platelets 150 - 400 K/uL 222 250 290    CMP Latest Ref Rng & Units 01/30/2019 11/01/2017 03/17/2017  Glucose 65 - 99 mg/dL 104(H) 129(H) 90  BUN 8 - 27 mg/dL _0 Creatinine 0.57 - 1.00 mg/dL 0.79 0.96 0.75  Sodium 134 - 144 mmol/L 140 142 143  Potassium 3.5 - 5.2 mmol/L 4.5 4.4 4.1  Chloride 96 - 106 mmol/L 101 105 103  CO2 20 - 29 mmol/L _1 Calcium 8.7 - 10.3 mg/dL 10.6(H) 9.9 10.3  Total Protein 6.0 - 8.5 g/dL 7.3 - 7.4  Total Bilirubin 0.0  - 1.2 mg/dL 0.3 - 0.3  Alkaline Phos 39 - 117 IU/L 81 - 84  AST 0 - 40 IU/L 12 - 19  ALT 0 - 32 IU/L 10 - 11    Lipid Panel  Lab Results  Component Value Date   CHOL 152 01/30/2019   HDL 38 (L) 01/30/2019   LDLCALC 96 01/30/2019   TRIG 95 01/30/2019   CHOLHDL 4.0 01/30/2019   No components found for: NTPROBNP No results for input(s): PROBNP in the last 8760 hours. No results for input(s): TSH in the last 8760 hours.  BMP No results for input(s): NA, K,  CL, CO2, GLUCOSE, BUN, CREATININE, CALCIUM, GFRNONAA, GFRAA in the last 8760 hours.  HEMOGLOBIN A1C Lab Results  Component Value Date   HGBA1C 6.1 01/30/2019   MPG 134 03/04/2015   External Labs: Collected: 02/24/2020 Creatinine 1.04 mg/dL. eGFR: 65 mL/min per 1.73 m Lipid profile: Total cholesterol 145, triglycerides 91, HDL 40, LDL 86, non-HDL 105 TSH: 1.96  IMPRESSION:    ICD-10-CM   1. Atherosclerosis of native coronary artery of native heart without angina pectoris  I25.10 EKG 12-Lead    PCV ECHOCARDIOGRAM COMPLETE    PCV MYOCARDIAL PERFUSION WITH LEXISCAN  2. History of coronary angioplasty with insertion of stent  Z95.5 PCV ECHOCARDIOGRAM COMPLETE    PCV MYOCARDIAL PERFUSION WITH LEXISCAN  3. Hx of non-ST elevation myocardial infarction (NSTEMI)  I25.2 PCV ECHOCARDIOGRAM COMPLETE    PCV MYOCARDIAL PERFUSION WITH LEXISCAN  4. Other form of dyspnea  R06.09 PCV ECHOCARDIOGRAM COMPLETE    PCV MYOCARDIAL PERFUSION WITH LEXISCAN  5. Pure hypercholesterolemia  E78.00   6. HYPERTENSION, BENIGN ESSENTIAL  I10 amLODipine (NORVASC) 10 MG tablet  7. Tobacco abuse  Z72.0   8. Type 2 diabetes mellitus with other circulatory complication, without long-term current use of insulin (HCC)  E11.59      RECOMMENDATIONS: TASHIYA SOUDERS is a 64 y.o. female whose past medical history and cardiac risk factors include: Hx of NSTEMI, CAD s/p PCI to RCA with DES, Hyperlipidemia, HTN, Smoking, Hx of CVA (11/12 right external  capsule), Hx of vasovagal syncope, postmenopausal female, advanced age.  Atherosclerosis of the native coronary arteries without angina pectoris with prior angioplasty and stenting:  Currently patient denies any chest pain or anginal equivalent.  Patient is euvolemic and not in congestive heart failure.  Continue aspirin and Plavix.  Continue statin therapy.  Continue losartan.  Nitroglycerin tablets on a as needed basis.  Patient's last ischemic evaluation was in 2016 and therefore would recommend repeating an echocardiogram and stress test.  Educated on the importance of improving her modifiable cardiovascular risk factors for secondary prevention of CAD.  Benign essential hypertension:  Office blood pressure is within acceptable range but not at goal.  Medications reconciled.  Educated on the importance of a low-salt diet.  We will start amlodipine 10 mg p.o. every afternoon.  Patient is asked to keep a log of blood pressures and to bring it in at the next office visit.  Patient is being enrolled into [facility] for ambulatory blood pressure monitoring.  History of non-STEMI: Continue dual antiplatelet therapy and statin therapy.  Dyspnea on exertion: Chronic and stable.  Further recommendations to follow up on the results of the echo and stress test.  Hypercholesterolemia: Most recent lipid profile reviewed.  LDL currently not at goal.  Recommend a goal LDL of less than 70 mg/dL given her underlying diabetes.  Active Smoking:   Tobacco cessation counseling (CPT [phone]):  Hx of CAD, stroke.   Currently smoking 1 pack every 3 days.  Patient was informed of the dangers of tobacco abuse including stroke, cancer, and MI, as well as benefits of tobacco cessation.  Patient is willing to quit at this time.  Approximately 5 mins were spent counseling patient cessation techniques. We discussed various methods to help quit smoking, including deciding on a date to quit, joining a  support group, pharmacological agents- nicotine gum/patch/lozenges, chantix.   I will reassess her progress at the next follow-up visit   FINAL MEDICATION LIST END OF ENCOUNTER: Meds ordered this encounter  Medications  .  amLODipine (NORVASC) 10 MG tablet    Sig: Take 1 tablet (10 mg total) by mouth daily at 10 pm.     Current Outpatient Medications:  .  albuterol (VENTOLIN HFA) 108 (90 Base) MCG/ACT inhaler, INHALE 2 PUFFS EVERY 6 HOURS AS NEEDED FOR WHEEZING OR SHORTNESS OF BREATH. Must have office visit for refills, Disp: 18 g, Rfl: 0 .  amLODipine (NORVASC) 10 MG tablet, Take 1 tablet (10 mg total) by mouth daily at 10 pm., Disp: , Rfl:  .  aspirin 81 MG chewable tablet, CHEW AND SWALLOW 1 TABLET (81 MG TOTAL) BY MOUTH DAILY AFTER LUNCH., Disp: 90 tablet, Rfl: 0 .  atorvastatin (LIPITOR) 40 MG tablet, Take 1 tablet (40 mg total) by mouth daily. Must have office visit for refills, Disp: 90 tablet, Rfl: 0 .  citalopram (CELEXA) 10 MG tablet, Take 1 tablet (10 mg total) by mouth daily., Disp: 90 tablet, Rfl: 2 .  divalproex (DEPAKOTE ER) 250 MG 24 hr tablet, Take 1 tablet by mouth daily., Disp: , Rfl:  .  Elastic Bandages & Supports ([place]) MISC, 1 Package by Does not apply route daily. Dispense 1 pair, to fit, wear daily, take off at night, Disp: 1 each, Rfl: 0 .  losartan (COZAAR) 100 MG tablet, TAKE 1 TABLET EVERY DAY, Disp: 30 tablet, Rfl: 0 .  meloxicam (MOBIC) 7.5 MG tablet, Take 1 tablet (7.5 mg total) by mouth daily., Disp: 30 tablet, Rfl: 1 .  metFORMIN (GLUCOPHAGE) 500 MG tablet, Take 1 tablet (500 mg total) by mouth 2 (two) times daily with a meal., Disp: 180 tablet, Rfl: 0 .  nitroGLYCERIN (NITROSTAT) 0.4 MG SL tablet, DISSOLVE ONE TABLET UNDER THE TONGUE EVERY 5 MINUTES AS NEEDED FOR CHEST PAIN.  DO NOT EXCEED A TOTAL OF 3 DOSES IN 15 MINUTES, Disp: 25 tablet, Rfl: 0 .  clopidogrel (PLAVIX) 75 MG tablet, Take 1 tablet (75 mg total) by mouth daily. Please  schedule an appointment., Disp: 90 tablet, Rfl: 2 .  metoprolol succinate (TOPROL-XL) 50 MG 24 hr tablet, Take 1 tablet (50 mg total) by mouth daily. Take with or immediately following a meal., Disp: 90 tablet, Rfl: 2  Orders Placed This Encounter  Procedures  . PCV MYOCARDIAL PERFUSION WITH LEXISCAN  . EKG 12-Lead  . PCV ECHOCARDIOGRAM COMPLETE    There are no Patient Instructions on file for this visit.   --Continue cardiac medications as reconciled in final medication list. --Return in about 4 weeks (around 03/14/2020) for CAD, Review test results. Or sooner if needed. --Continue follow-up with your primary care physician regarding the management of your other chronic comorbid conditions.  Patient's questions and concerns were addressed to her satisfaction. She voices understanding of the instructions provided during this encounter.   This note was created using a voice recognition software as a result there may be grammatical errors inadvertently enclosed that do not reflect the nature of this encounter. Every attempt is made to correct such errors.  Rex Kras, [place], [facility]  Pager: [phone] Office: [phone]

## 2020-02-19 ENCOUNTER — Other Ambulatory Visit: Payer: Self-pay

## 2020-02-19 ENCOUNTER — Ambulatory Visit: Payer: Medicare HMO | Attending: Internal Medicine

## 2020-02-19 ENCOUNTER — Ambulatory Visit: Payer: Medicare HMO

## 2020-02-19 DIAGNOSIS — I252 Old myocardial infarction: Secondary | ICD-10-CM

## 2020-02-19 DIAGNOSIS — R0609 Other forms of dyspnea: Secondary | ICD-10-CM

## 2020-02-19 DIAGNOSIS — I2510 Atherosclerotic heart disease of native coronary artery without angina pectoris: Secondary | ICD-10-CM

## 2020-02-19 DIAGNOSIS — Z23 Encounter for immunization: Secondary | ICD-10-CM

## 2020-02-19 DIAGNOSIS — Z955 Presence of coronary angioplasty implant and graft: Secondary | ICD-10-CM

## 2020-02-19 NOTE — Progress Notes (Signed)
   Covid-19 Vaccination Clinic  Name:  Vanessa Proctor    MRN: [id number] DOB: [birth year]  02/19/2020  Vanessa Proctor was observed post Covid-19 immunization for 15 minutes without incident. She was provided with Vaccine Information Sheet and instruction to access the V-Safe system.   Vanessa Proctor was instructed to call [phone] with any severe reactions post vaccine: Marland Kitchen Difficulty breathing  . Swelling of face and throat  . A fast heartbeat  . A bad rash all over body  . Dizziness and weakness

## 2020-02-28 ENCOUNTER — Other Ambulatory Visit: Payer: Medicare HMO

## 2020-03-14 ENCOUNTER — Ambulatory Visit: Payer: Medicare HMO | Admitting: Cardiology

## 2020-03-18 ENCOUNTER — Other Ambulatory Visit: Payer: Self-pay | Admitting: Pharmacist

## 2020-03-18 DIAGNOSIS — I10 Essential (primary) hypertension: Secondary | ICD-10-CM

## 2020-03-18 DIAGNOSIS — I2510 Atherosclerotic heart disease of native coronary artery without angina pectoris: Secondary | ICD-10-CM

## 2020-03-21 MED ORDER — METOPROLOL SUCCINATE ER 50 MG PO TB24: 50.0000 mg | ORAL_TABLET | Freq: Every day | ORAL | 2 refills | Status: DC

## 2020-03-21 MED ORDER — CLOPIDOGREL BISULFATE 75 MG PO TABS: 75.0000 mg | ORAL_TABLET | Freq: Every day | ORAL | 2 refills | Status: DC

## 2020-04-01 ENCOUNTER — Other Ambulatory Visit: Payer: Medicare HMO

## 2020-04-09 ENCOUNTER — Ambulatory Visit: Payer: Medicare HMO | Admitting: Cardiology

## 2020-04-09 ENCOUNTER — Encounter: Payer: Self-pay | Admitting: Cardiology

## 2020-04-09 ENCOUNTER — Other Ambulatory Visit: Payer: Self-pay

## 2020-04-09 VITALS — BP 150/78 | HR 69 | Resp 16 | Ht 60.0 in | Wt 146.8 lb

## 2020-04-09 DIAGNOSIS — Z955 Presence of coronary angioplasty implant and graft: Secondary | ICD-10-CM

## 2020-04-09 DIAGNOSIS — I6523 Occlusion and stenosis of bilateral carotid arteries: Secondary | ICD-10-CM

## 2020-04-09 DIAGNOSIS — E7800 Pure hypercholesterolemia, unspecified: Secondary | ICD-10-CM

## 2020-04-09 DIAGNOSIS — R0609 Other forms of dyspnea: Secondary | ICD-10-CM

## 2020-04-09 DIAGNOSIS — I2510 Atherosclerotic heart disease of native coronary artery without angina pectoris: Secondary | ICD-10-CM

## 2020-04-09 DIAGNOSIS — I252 Old myocardial infarction: Secondary | ICD-10-CM

## 2020-04-09 DIAGNOSIS — E1159 Type 2 diabetes mellitus with other circulatory complications: Secondary | ICD-10-CM

## 2020-04-09 DIAGNOSIS — Z720 Tobacco use: Secondary | ICD-10-CM

## 2020-04-09 DIAGNOSIS — I10 Essential (primary) hypertension: Secondary | ICD-10-CM

## 2020-04-09 NOTE — Progress Notes (Signed)
ID:  Vanessa Proctor, DOB [birth year], MRN [id number]  PCP:  Andree Moro, DO  Cardiologist:  Rex Kras, DO, [facility] (established care 02/15/2020) Former Cardiology Providers: Dr. Loralie Champagne, Truitt Merle, RN, ANP-C  Chief Complaint  Patient presents with  . Coronary Artery Disease  . Follow-up    4 weeks    HPI  Vanessa Proctor is a 64 y.o. female who presents to the office with a chief complaint of " follow up for hypertension and CAD management." Patient's past medical history and cardiovascular risk factors include: Hx of NSTEMI, CAD s/p PCI to RCA with DES, Hyperlipidemia, HTN, Smoking, Hx of CVA (11/12 right external capsule), Hx of vasovagal syncope, postmenopausal female, advanced age.  She is referred to the office at the request of Andree Moro, DO for evaluation of hypertension and CAD.  Patient was formally under the care of Dr. Loralie Champagne and his advanced nurse practitioner and was last seen by the practice in 2017.  Since then patient has not followed up with cardiology until November 2021 when she reestablish care for management of hypertension and CAD as per her PCP recommendations.  She has history of coronary artery disease and status post PCI to the RCA with a drug-eluting stent in the past.  Last ischemic evaluation was in 2016 and therefore the last office visit recommended an echo and stress test.  Echocardiogram notes preserved LVEF, normal diastolic filling pattern, and mild mitral regurgitation.  Stress test is still pending.  She denies any chest pain or anginal equivalent.  No recent hospitalizations or urgent care visits for cardiovascular symptoms.  At last office visit patient was enrolled into principal care management for blood pressure monitoring was started on Norvasc.  Patient tolerated the addition of Norvasc well without any side effects or intolerances.  Patient states that her home blood pressure has improved.  I do not have a blood pressure  log to review at today's office visit.  Patient is informed that I will review the blood pressure log and reach out to her if any additional medication changes are recommended.   Given her history of non-STEMI, CAD with prior PCI, history of CVA she was recommended on importance of complete smoking cessation at the last office visit.  However, patient has reduced the amount of cigarette smoking to approximately 6/day.  She is motivated to stop smoking we will readdress this at the next office visit.  FUNCTIONAL STATUS: No structured exercise program or daily routine.   ALLERGIES: Allergies  Allergen Reactions  . Bactrim Rash  . Sulfa Antibiotics Rash    MEDICATION LIST PRIOR TO VISIT: Current Meds  Medication Sig  . albuterol (VENTOLIN HFA) 108 (90 Base) MCG/ACT inhaler INHALE 2 PUFFS EVERY 6 HOURS AS NEEDED FOR WHEEZING OR SHORTNESS OF BREATH. Must have office visit for refills  . amLODipine (NORVASC) 10 MG tablet Take 1 tablet (10 mg total) by mouth daily at 10 pm.  . aspirin 81 MG chewable tablet CHEW AND SWALLOW 1 TABLET (81 MG TOTAL) BY MOUTH DAILY AFTER LUNCH.  Marland Kitchen atorvastatin (LIPITOR) 40 MG tablet Take 1 tablet (40 mg total) by mouth daily. Must have office visit for refills  . citalopram (CELEXA) 10 MG tablet Take 1 tablet (10 mg total) by mouth daily.  . clopidogrel (PLAVIX) 75 MG tablet Take 1 tablet (75 mg total) by mouth daily. Please schedule an appointment.  . cyclobenzaprine (FLEXERIL) 10 MG tablet Take 10 mg by mouth 3 (three) times daily  as needed for muscle spasms.  Regino Schultze Bandages & Supports (MEDICAL COMPRESSION STOCKINGS) MISC 1 Package by Does not apply route daily. Dispense 1 pair, to fit, wear daily, take off at night  . losartan (COZAAR) 100 MG tablet TAKE 1 TABLET EVERY DAY  . metFORMIN (GLUCOPHAGE) 500 MG tablet Take 1 tablet (500 mg total) by mouth 2 (two) times daily with a meal.  . metoprolol succinate (TOPROL-XL) 50 MG 24 hr tablet Take 1 tablet (50 mg  total) by mouth daily. Take with or immediately following a meal.  . nitroGLYCERIN (NITROSTAT) 0.4 MG SL tablet DISSOLVE ONE TABLET UNDER THE TONGUE EVERY 5 MINUTES AS NEEDED FOR CHEST PAIN.  DO NOT EXCEED A TOTAL OF 3 DOSES IN 15 MINUTES     PAST MEDICAL HISTORY: Past Medical History:  Diagnosis Date  . Arthritis    knees  . Constipation last month  . Coronary artery disease   . Depression   . Drug abuse ([place])   . Hyperlipidemia   . Hypertension   . Myocardial infarction [facility])    november 1st 2012  . Nausea & vomiting last month  . Rectal bleeding   . Stroke [facility])    november, 2012  . Tobacco abuse     PAST SURGICAL HISTORY: Past Surgical History:  Procedure Laterality Date  . ABDOMINAL HYSTERECTOMY  1985   partial  . cataract surgery Left   . COLONOSCOPY N/A 04/28/2013   Procedure: COLONOSCOPY;  Surgeon: Beryle Beams, MD;  Location: [facility] ENDOSCOPY;  Service: Endoscopy;  Laterality: N/A;  . CORONARY ANGIOPLASTY WITH STENT PLACEMENT  2012   x 1  . FINGER FRACTURE SURGERY Left    reduction with  splint  . knot drained and removed from side of face  2007  . TONSILLECTOMY  1980    FAMILY HISTORY: The patient family history includes Breast cancer in her cousin; Cancer in her mother; Coronary artery disease in an other family member; Diabetes in her brother and sister; Heart attack in her maternal grandfather; Hypertension in her maternal grandfather and sister; Stroke in her brother.  SOCIAL HISTORY:  The patient  reports that she has been smoking cigarettes. She has a 20.00 pack-year smoking history. She has never used smokeless tobacco. She reports current drug use. She reports that she does not drink alcohol.  REVIEW OF SYSTEMS: Review of Systems  Constitutional: Negative for chills and fever.  HENT: Negative for hoarse voice and nosebleeds.   Eyes: Negative for discharge, double vision and pain.  Cardiovascular: Positive for dyspnea on exertion (stable). Negative for  chest pain, claudication, leg swelling, near-syncope, orthopnea, palpitations, paroxysmal nocturnal dyspnea and syncope.  Respiratory: Negative for hemoptysis and shortness of breath.   Musculoskeletal: Positive for joint pain. Negative for muscle cramps and myalgias.  Gastrointestinal: Negative for abdominal pain, constipation, diarrhea, hematemesis, hematochezia, melena, nausea and vomiting.  Neurological: Negative for dizziness and light-headedness.    PHYSICAL EXAM: Vitals with BMI 04/09/2020 02/15/2020 02/15/2020  Height '5\' 0"'  - '5\' 0"'   Weight 146 lbs 13 oz - 152 lbs  BMI 16.10 - 96.04  Systolic 540 981 191  Diastolic 78 82 89  Pulse 69 76 75  Some encounter information is confidential and restricted. Go to Review Flowsheets activity to see all data.    CONSTITUTIONAL: Well-developed and well-nourished. No acute distress.  SKIN: Skin is warm and dry. No rash noted. No cyanosis. No pallor. No jaundice HEAD: Normocephalic and atraumatic.  EYES: No scleral icterus MOUTH/THROAT:  Moist oral membranes.  NECK: No JVD present. No thyromegaly noted. No carotid bruits  LYMPHATIC: No visible cervical adenopathy.  CHEST Normal respiratory effort. No intercostal retractions  LUNGS: Clear to auscultation bilaterally.  No stridor. No wheezes. No rales.  CARDIOVASCULAR: Regular rate and rhythm, positive S1-S2, no murmurs rubs or gallops appreciated. ABDOMINAL: No apparent ascites.  EXTREMITIES: No peripheral edema  HEMATOLOGIC: No significant bruising NEUROLOGIC: Oriented to person, place, and time. Nonfocal. Normal muscle tone.  PSYCHIATRIC: Normal mood and affect. Normal behavior. Cooperative  CARDIAC DATABASE: EKG: 02/15/2020: Normal sinus rhythm, 71 bpm normal axis, left atrial enlargement, poor R wave progression, without underlying injury pattern.  Echocardiogram: 02/19/2020: Normal LV systolic function with visual EF 60-65%. Left ventricle cavity is normal in size. Normal global wall  motion. Normal diastolic filling pattern, normal LAP.  Mild (Grade I) mitral regurgitation. Small pericardial effusion. There is no hemodynamic significance. Compared to prior study dated 02/2015: MR was trace now mild and small pericardial effusion is new. Otherwise, no other significant changes noted.   Stress Testing: 11/2014:  Nuclear stress EF: 69%.  There was no ST segment deviation noted during stress.  This is a low risk study.  The left ventricular ejection fraction is hyperdynamic (>65%).   There was a fixed, small mild mid anteroseptal perfusion defect.  Given normal wall motion, I think that this is most likely attenuation.  No ischemia.  Low risk study.  Of note, frequent PVCs with exercise.   Heart Catheterization: 02/2011: LHC with EF 55-60%, basal inferior HK, 99% mRCA stenosis, 40% OM1.  Patient had Promus DES to RCA  Carotid duplex: 02/2015: Bilateral - 1% TO 39% ICA stenosis. Vertebral artery flow is antegrade.   Cardiac Monitor: 02/2015: NSR, rare PVCs, no significant arrhythmias.Marland Kitchen  LABORATORY DATA: CBC Latest Ref Rng & Units 06/10/2015 03/03/2015 03/22/2013  WBC 4.0 - 10.5 K/uL 4.4 6.6 5.8  Hemoglobin 12.0 - 15.0 g/dL 12.2 13.4 13.1  Hematocrit 36.0 - 46.0 % 36.8 40.5 38.3  Platelets 150 - 400 K/uL 222 250 290    CMP Latest Ref Rng & Units 01/30/2019 11/01/2017 03/17/2017  Glucose 65 - 99 mg/dL 104(H) 129(H) 90  BUN 8 - 27 mg/dL '8 19 12  ' Creatinine 0.57 - 1.00 mg/dL 0.79 0.96 0.75  Sodium 134 - 144 mmol/L 140 142 143  Potassium 3.5 - 5.2 mmol/L 4.5 4.4 4.1  Chloride 96 - 106 mmol/L 101 105 103  CO2 20 - 29 mmol/L '24 23 26  ' Calcium 8.7 - 10.3 mg/dL 10.6(H) 9.9 10.3  Total Protein 6.0 - 8.5 g/dL 7.3 - 7.4  Total Bilirubin 0.0 - 1.2 mg/dL 0.3 - 0.3  Alkaline Phos 39 - 117 IU/L 81 - 84  AST 0 - 40 IU/L 12 - 19  ALT 0 - 32 IU/L 10 - 11    Lipid Panel  Lab Results  Component Value Date   CHOL 152 01/30/2019   HDL 38 (L) 01/30/2019   LDLCALC 96  01/30/2019   TRIG 95 01/30/2019   CHOLHDL 4.0 01/30/2019   No components found for: NTPROBNP No results for input(s): PROBNP in the last 8760 hours. No results for input(s): TSH in the last 8760 hours.  BMP No results for input(s): NA, K, CL, CO2, GLUCOSE, BUN, CREATININE, CALCIUM, GFRNONAA, GFRAA in the last 8760 hours.  HEMOGLOBIN A1C Lab Results  Component Value Date   HGBA1C 6.1 01/30/2019   MPG 134 03/04/2015   External Labs: Collected: 02/24/2020 Creatinine 1.04 mg/dL. eGFR:  65 mL/min per 1.73 m Lipid profile: Total cholesterol 145, triglycerides 91, HDL 40, LDL 86, non-HDL 105 TSH: 1.96  IMPRESSION:    ICD-10-CM   1. Atherosclerosis of native coronary artery of native heart without angina pectoris  I25.10   2. History of coronary angioplasty with insertion of stent  Z95.5   3. Hx of non-ST elevation myocardial infarction (NSTEMI)  I25.2   4. Other form of dyspnea  R06.09   5. Pure hypercholesterolemia  E78.00 PCV CAROTID DUPLEX (BILATERAL)  6. HYPERTENSION, BENIGN ESSENTIAL  I10   7. Tobacco abuse  Z72.0   8. Type 2 diabetes mellitus with other circulatory complication, without long-term current use of insulin (HCC)  E11.59   9. Mild atherosclerosis of carotid artery, bilateral  I65.23 PCV CAROTID DUPLEX (BILATERAL)     RECOMMENDATIONS: AYLLA HUFFINE is a 64 y.o. female whose past medical history and cardiac risk factors include: Hx of NSTEMI, CAD s/p PCI to RCA with DES, Hyperlipidemia, HTN, Smoking, Hx of CVA (11/12 right external capsule), NIDDM, Hx of vasovagal syncope, postmenopausal female, advanced age.  Atherosclerosis of the native coronary arteries without angina pectoris with prior angioplasty and stenting:  Currently patient denies any chest pain or anginal equivalent.  Patient is euvolemic and not in congestive heart failure.  Continue aspirin and Plavix.  Continue statin therapy.  Continue losartan.  Nitroglycerin tablets on a as  needed basis.  No use of sublingual nitroglycerin tablets since last visit  Echocardiogram results reviewed with the patient.  LVEF is preserved, normal diastolic filling pattern, mild MR.  Patient is scheduled for her stress test 04/10/2020.  Educated on the importance of improving her modifiable cardiovascular risk factors for secondary prevention of CAD.  Benign essential hypertension:  Office blood pressure is not within acceptable range.  Patient states that her home blood pressures are better controlled.  Medications reconciled.  Educated on the importance of a low-salt diet.  Continue amlodipine 10 mg p.o. every afternoon.  Patient is being enrolled into RPM for ambulatory blood pressure monitoring.  I do not have any blood pressure logs reviewed during today's office encounter.  I will review the blood pressure log once available and will contact the patient with additional medication titration is recommended.  History of non-STEMI: Continue dual antiplatelet therapy and statin therapy.  Dyspnea on exertion: Chronic and stable.  Monitor for now.  History of bilateral carotid artery atherosclerosis: Continue aspirin and statin therapy.  Check carotid duplex.  Hypercholesterolemia: Most recent lipid profile reviewed.  LDL currently not at goal.  Recommend a goal LDL of less than 70 mg/dL given her underlying diabetes.  Currently managed by primary care provider.  Active Smoking:   Tobacco cessation counseling (CPT 4785695324):  Hx of CAD, stroke.   Currently smoking 6 cigarettes/day which is better compared to prior office visit when she was smoking at least 10 cigarettes/day.  Patient was informed of the dangers of tobacco abuse including stroke, cancer, and MI, as well as benefits of tobacco cessation.  Patient is willing to quit at this time.  Approximately 7 mins were spent counseling patient cessation techniques. We discussed various methods to help quit smoking,  including deciding on a date to quit, joining a support group, pharmacological agents- nicotine gum/patch/lozenges, chantix.   I will reassess her progress at the next follow-up visit   FINAL MEDICATION LIST END OF ENCOUNTER: No orders of the defined types were placed in this encounter.    Current Outpatient Medications:  .  albuterol (VENTOLIN HFA) 108 (90 Base) MCG/ACT inhaler, INHALE 2 PUFFS EVERY 6 HOURS AS NEEDED FOR WHEEZING OR SHORTNESS OF BREATH. Must have office visit for refills, Disp: 18 g, Rfl: 0 .  amLODipine (NORVASC) 10 MG tablet, Take 1 tablet (10 mg total) by mouth daily at 10 pm., Disp: , Rfl:  .  aspirin 81 MG chewable tablet, CHEW AND SWALLOW 1 TABLET (81 MG TOTAL) BY MOUTH DAILY AFTER LUNCH., Disp: 90 tablet, Rfl: 0 .  atorvastatin (LIPITOR) 40 MG tablet, Take 1 tablet (40 mg total) by mouth daily. Must have office visit for refills, Disp: 90 tablet, Rfl: 0 .  citalopram (CELEXA) 10 MG tablet, Take 1 tablet (10 mg total) by mouth daily., Disp: 90 tablet, Rfl: 2 .  clopidogrel (PLAVIX) 75 MG tablet, Take 1 tablet (75 mg total) by mouth daily. Please schedule an appointment., Disp: 90 tablet, Rfl: 2 .  cyclobenzaprine (FLEXERIL) 10 MG tablet, Take 10 mg by mouth 3 (three) times daily as needed for muscle spasms., Disp: , Rfl:  .  Elastic Bandages & Supports ([place]) MISC, 1 Package by Does not apply route daily. Dispense 1 pair, to fit, wear daily, take off at night, Disp: 1 each, Rfl: 0 .  losartan (COZAAR) 100 MG tablet, TAKE 1 TABLET EVERY DAY, Disp: 30 tablet, Rfl: 0 .  metFORMIN (GLUCOPHAGE) 500 MG tablet, Take 1 tablet (500 mg total) by mouth 2 (two) times daily with a meal., Disp: 180 tablet, Rfl: 0 .  metoprolol succinate (TOPROL-XL) 50 MG 24 hr tablet, Take 1 tablet (50 mg total) by mouth daily. Take with or immediately following a meal., Disp: 90 tablet, Rfl: 2 .  nitroGLYCERIN (NITROSTAT) 0.4 MG SL tablet, DISSOLVE ONE TABLET UNDER THE TONGUE  EVERY 5 MINUTES AS NEEDED FOR CHEST PAIN.  DO NOT EXCEED A TOTAL OF 3 DOSES IN 15 MINUTES, Disp: 25 tablet, Rfl: 0  Orders Placed This Encounter  Procedures  . PCV CAROTID DUPLEX (BILATERAL)    There are no Patient Instructions on file for this visit.   --Continue cardiac medications as reconciled in final medication list. --Return in about 3 months (around 07/08/2020) for Reevaluation of, CAD, Review test results. Or sooner if needed. --Continue follow-up with your primary care physician regarding the management of your other chronic comorbid conditions.  Patient's questions and concerns were addressed to her satisfaction. She voices understanding of the instructions provided during this encounter.   This note was created using a voice recognition software as a result there may be grammatical errors inadvertently enclosed that do not reflect the nature of this encounter. Every attempt is made to correct such errors.  Rex Kras, [place], [facility]  Pager: [phone] Office: [phone]

## 2020-04-10 ENCOUNTER — Ambulatory Visit: Payer: Medicare HMO

## 2020-04-10 DIAGNOSIS — R0609 Other forms of dyspnea: Secondary | ICD-10-CM

## 2020-04-10 DIAGNOSIS — I252 Old myocardial infarction: Secondary | ICD-10-CM

## 2020-04-10 DIAGNOSIS — I2510 Atherosclerotic heart disease of native coronary artery without angina pectoris: Secondary | ICD-10-CM

## 2020-04-10 DIAGNOSIS — Z955 Presence of coronary angioplasty implant and graft: Secondary | ICD-10-CM

## 2020-04-16 ENCOUNTER — Other Ambulatory Visit: Payer: Self-pay | Admitting: Cardiology

## 2020-04-16 ENCOUNTER — Ambulatory Visit: Payer: Medicare HMO

## 2020-04-16 ENCOUNTER — Other Ambulatory Visit: Payer: Self-pay

## 2020-04-16 DIAGNOSIS — I6523 Occlusion and stenosis of bilateral carotid arteries: Secondary | ICD-10-CM

## 2020-04-16 DIAGNOSIS — E7800 Pure hypercholesterolemia, unspecified: Secondary | ICD-10-CM

## 2020-06-19 ENCOUNTER — Other Ambulatory Visit: Payer: Self-pay | Admitting: Family Medicine

## 2020-06-19 DIAGNOSIS — I10 Essential (primary) hypertension: Secondary | ICD-10-CM

## 2020-06-26 ENCOUNTER — Other Ambulatory Visit: Payer: Self-pay

## 2020-06-26 ENCOUNTER — Telehealth (INDEPENDENT_AMBULATORY_CARE_PROVIDER_SITE_OTHER): Payer: Medicare HMO | Admitting: Psychiatry

## 2020-06-26 DIAGNOSIS — F325 Major depressive disorder, single episode, in full remission: Secondary | ICD-10-CM | POA: Diagnosis not present

## 2020-06-26 MED ORDER — CITALOPRAM HYDROBROMIDE 10 MG PO TABS: 10.0000 mg | ORAL_TABLET | Freq: Every day | ORAL | 2 refills | Status: DC

## 2020-06-26 NOTE — Progress Notes (Signed)
5 view Patient ID: NEILA TEEM, female   DOB: 1955-11-23, 65 y.o.   MRN: [id number]  Psychiatric Initial Adult Assessment   Patient Identification: Vanessa Proctor MRN:  [id number] Date of Evaluation:  06/26/2020 Referral Source: Self-referred Chief Complaint: Weight loss  Visit Diagnosis: Bipolar disorder, type 1 Major depression remissio    Today patient is doing well.  Her mood is good.  She denies any depression or anxiety.  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  She loves doing this job.  The students however are difficult.  One of the biggest stresses the cost of gas.  She drives to work every day and back.  She did get a new car which is a pretty good get sufficient car.  Nonetheless expensive.  Medical issues fairly stable.  She has had some poorly controlled hypertension which is now under better control.  She has diabetes.  She says her hemoglobin A1c runs around 7.  She denies chest pain or shortness of breath or any neurological symptoms.  Patient has a daughter who lives in [place] who she sees now and then.  She has another daughter who is harder to get to lives in [place].  This patient has 6 grandchildren and 3 great-grandchildren.  One of her great grandchildren ages 97 is good to have hernia surgery in the next few weeks.  Patient seems to be handling things fairly well.  The patient is actively involved in church.  She reads a lot online.  She is sleeping and eating actually very well.  She is positive and optimistic.  She is very proactive. PTSD Symptoms:   Past Psychiatric History: Psychiatric hospitalization at [facility] in 1999.  Previous Psychotropic Medications: Yes   Substance Abuse History in the last 12 months:  Yes.    Consequences of Substance Abuse:   Past Medical History:  Past Medical History:  Diagnosis Date  . Arthritis    knees  . Constipation last month  . Coronary artery disease   . Depression   .  Drug abuse ([place])   . Hyperlipidemia   . Hypertension   . Myocardial infarction [facility])    november 1st 2012  . Nausea & vomiting last month  . Rectal bleeding   . Stroke [facility])    november, 2012  . Tobacco abuse     Past Surgical History:  Procedure Laterality Date  . ABDOMINAL HYSTERECTOMY  1985   partial  . cataract surgery Left   . COLONOSCOPY N/A 04/28/2013   Procedure: COLONOSCOPY;  Surgeon: Beryle Beams, MD;  Location: [facility];  Service: Endoscopy;  Laterality: N/A;  . CORONARY ANGIOPLASTY WITH STENT PLACEMENT  2012   x 1  . FINGER FRACTURE SURGERY Left    reduction with  splint  . knot drained and removed from side of face  2007  . TONSILLECTOMY  1980    Family Psychiatric History:   Family History:  Family History  Problem Relation Age of Onset  . Cancer Mother        mass in abdomen  . Hypertension Sister   . Diabetes Sister   . Coronary artery disease Other   . Stroke Brother   . Diabetes Brother   . Heart attack Maternal Grandfather   . Hypertension Maternal Grandfather   . Breast cancer Cousin     Social History:   Social History   Socioeconomic History  . Marital status: Single    Spouse name:  Not on file  . Number of children: 2  . Years of education: Not on file  . Highest education level: Not on file  Occupational History  . Not on file  Tobacco Use  . Smoking status: Current Every Day Smoker    Packs/day: 0.50    Years: 40.00    Pack years: 20.00    Types: Cigarettes  . Smokeless tobacco: Never Used  Vaping Use  . Vaping Use: Never used  Substance and Sexual Activity  . Alcohol use: No    Alcohol/week: 0.0 standard drinks  . Drug use: Yes    Comment: marijuana occassionaly  . Sexual activity: Not Currently  Other Topics Concern  . Not on file  Social History Narrative  . Not on file   Social Determinants of Health   Financial Resource Strain: Not on file  Food Insecurity: Not on file  Transportation Needs: Not on file   Physical Activity: Not on file  Stress: Not on file  Social Connections: Not on file    Additional Social History:   Allergies:   Allergies  Allergen Reactions  . Bactrim Rash  . Sulfa Antibiotics Rash    Metabolic Disorder Labs: Lab Results  Component Value Date   HGBA1C 6.1 01/30/2019   MPG 134 03/04/2015   MPG 128 (H) 03/09/2011   No results found for: PROLACTIN Lab Results  Component Value Date   CHOL 152 01/30/2019   TRIG 95 01/30/2019   HDL 38 (L) 01/30/2019   CHOLHDL 4.0 01/30/2019   VLDL 36 (H) 02/04/2016   LDLCALC 96 01/30/2019   [place] 73 03/17/2017     Current Medications: Current Outpatient Medications  Medication Sig Dispense Refill  . albuterol (VENTOLIN HFA) 108 (90 Base) MCG/ACT inhaler INHALE 2 PUFFS EVERY 6 HOURS AS NEEDED FOR WHEEZING OR SHORTNESS OF BREATH. Must have office visit for refills 18 g 0  . amLODipine (NORVASC) 10 MG tablet Take 1 tablet (10 mg total) by mouth daily at 10 pm.    . aspirin 81 MG chewable tablet CHEW AND SWALLOW 1 TABLET (81 MG TOTAL) BY MOUTH DAILY AFTER LUNCH. 90 tablet 0  . atorvastatin (LIPITOR) 40 MG tablet Take 1 tablet (40 mg total) by mouth daily. Must have office visit for refills 90 tablet 0  . citalopram (CELEXA) 10 MG tablet Take 1 tablet (10 mg total) by mouth daily. 30 tablet 2  . clopidogrel (PLAVIX) 75 MG tablet Take 1 tablet (75 mg total) by mouth daily. Please schedule an appointment. 90 tablet 2  . cyclobenzaprine (FLEXERIL) 10 MG tablet Take 10 mg by mouth 3 (three) times daily as needed for muscle spasms.    Regino Schultze Bandages & Supports (MEDICAL COMPRESSION STOCKINGS) MISC 1 Package by Does not apply route daily. Dispense 1 pair, to fit, wear daily, take off at night 1 each 0  . losartan (COZAAR) 100 MG tablet TAKE 1 TABLET EVERY DAY 30 tablet 0  . metFORMIN (GLUCOPHAGE) 500 MG tablet Take 1 tablet (500 mg total) by mouth 2 (two) times daily with a meal. 180 tablet 0  . metoprolol succinate  (TOPROL-XL) 50 MG 24 hr tablet Take 1 tablet (50 mg total) by mouth daily. Take with or immediately following a meal. 90 tablet 2  . nitroGLYCERIN (NITROSTAT) 0.4 MG SL tablet DISSOLVE ONE TABLET UNDER THE TONGUE EVERY 5 MINUTES AS NEEDED FOR CHEST PAIN.  DO NOT EXCEED A TOTAL OF 3 DOSES IN 15 MINUTES 25 tablet 0  No current facility-administered medications for this visit.    Neurologic: Headache: No Seizure: No Paresthesias:No  Musculoskeletal: Strength & Muscle Tone: within normal limits Gait & Station: normal Patient leans  Psychiatric Specialty Exam: ROS  There were no vitals taken for this visit.There is no height or weight on file to calculate BMI.  General Appearance: Casual  Eye Contact:  Good  Speech:  Clear and Coherent  Volume:  Normal  Mood:  Negative  Affect:  Appropriate  Thought Process:  Coherent  Orientation:  Full (Time, Place, and Person)  Thought Content:  WDL  Suicidal Thoughts:  No  Homicidal Thoughts:  No  Memory:  Negative  Judgement:  Good  Insight:  Good  Psychomotor Activity:  Normal  Concentration:  Good  Recall:  Good  Fund of Knowledge:Good  Language: Fair  Akathisia:  No  Handed:  Right  AIMS (if indicated):    Assets:  Desire for Improvement  ADL's:  Intact  Cognition: WNL  Sleep:     Treatment Plan Summary: 3/16/20222:25 PM  This patient is doing very well.  She has major depression in remission.  She will continue taking Celexa 10 mg.  We will send off 1 month supply to [facility] and then she will get a 53-month supply from her regular [employer] mail order.  The patient is functioning very well.  Physically she is very stable.  She will return to see me ideally in person in 4 months.

## 2020-07-03 NOTE — Telephone Encounter (Signed)
Please inform the patient that we will discuss her questions at the upcoming visit.

## 2020-07-05 NOTE — Progress Notes (Signed)
ID:  Vanessa Proctor, DOB [birth year], MRN [id number]  PCP:  Andree Moro, DO  Cardiologist: Rex Kras, DO, [facility] (established care 02/15/2020) Former Cardiology Providers: Dr. Loralie Champagne, Truitt Merle, RN, ANP-C  Date: 07/08/20 Last Office Visit: 04/09/2020  Chief Complaint  Patient presents with  . Atherosclerosis of native coronary artery of native heart w  . Follow-up  . Results    HPI  Vanessa Proctor is a 64 y.o. female who presents to the office with a chief complaint of " 22-monthfollow-up for management of CAD and review test results." Patient's past medical history and cardiovascular risk factors include: Hx of NSTEMI, CAD s/p PCI to RCA with DES, Hyperlipidemia, HTN, Smoking, Hx of CVA (11/12 right external capsule), Hx of vasovagal syncope, postmenopausal female, advanced age.  She is referred to the office at the request of PAndree Moro DO for evaluation of hypertension and CAD.  Patient has history of coronary artery disease status post PCI to the RCA with a DES in the past.  Has undergone a recent ischemic evaluation as of 2021 results was reviewed again at today's office visit and noted below for further reference.  Since last office visit patient states that she is doing well from a cardiovascular standpoint.  No active chest pain or heart failure symptoms.  No recent hospitalizations or urgent care visits for cardiovascular symptoms.  Last office visit patient was smoking approximately 6 cigarettes/day.  We had discussed the importance of complete smoking cessation given her comorbid conditions.  Patient states that she now smokes 7 cigarettes/day.  Patient is also enrolled into principal care management for ambulatory blood pressure monitoring.  Average blood pressure is 147/87 with a pulse of 80 bpm.  Patient states that she could do better with checking her blood pressures more often.  She tries to consume a low-salt diet.  FUNCTIONAL STATUS: No  structured exercise program or daily routine.   ALLERGIES: Allergies  Allergen Reactions  . Bactrim Rash  . Sulfa Antibiotics Rash    MEDICATION LIST PRIOR TO VISIT: Current Meds  Medication Sig  . albuterol (VENTOLIN HFA) 108 (90 Base) MCG/ACT inhaler INHALE 2 PUFFS EVERY 6 HOURS AS NEEDED FOR WHEEZING OR SHORTNESS OF BREATH. Must have office visit for refills  . amLODipine (NORVASC) 10 MG tablet Take 1 tablet (10 mg total) by mouth daily at 10 pm.  . aspirin 81 MG chewable tablet CHEW AND SWALLOW 1 TABLET (81 MG TOTAL) BY MOUTH DAILY AFTER LUNCH.  .Marland Kitchenatorvastatin (LIPITOR) 40 MG tablet Take 1 tablet (40 mg total) by mouth daily. Must have office visit for refills  . citalopram (CELEXA) 10 MG tablet Take 1 tablet (10 mg total) by mouth daily.  .Regino SchultzeBandages & Supports (MEDICAL COMPRESSION STOCKINGS) MISC 1 Package by Does not apply route daily. Dispense 1 pair, to fit, wear daily, take off at night  . losartan (COZAAR) 100 MG tablet TAKE 1 TABLET EVERY DAY  . metFORMIN (GLUCOPHAGE) 500 MG tablet Take 1 tablet (500 mg total) by mouth 2 (two) times daily with a meal.  . metoprolol succinate (TOPROL-XL) 50 MG 24 hr tablet Take 1 tablet (50 mg total) by mouth daily. Take with or immediately following a meal.  . nitroGLYCERIN (NITROSTAT) 0.4 MG SL tablet DISSOLVE ONE TABLET UNDER THE TONGUE EVERY 5 MINUTES AS NEEDED FOR CHEST PAIN.  DO NOT EXCEED A TOTAL OF 3 DOSES IN 15 MINUTES  . [DISCONTINUED] clopidogrel (PLAVIX) 75 MG tablet Take 1 tablet (  75 mg total) by mouth daily. Please schedule an appointment.     PAST MEDICAL HISTORY: Past Medical History:  Diagnosis Date  . Arthritis    knees  . Constipation last month  . Coronary artery disease   . Depression   . Drug abuse (HCC)   . Hyperlipidemia   . Hypertension   . Myocardial infarction (HCC)    november 1st 2012  . Nausea & vomiting last month  . Rectal bleeding   . Stroke (HCC)    november, 2012  . Tobacco abuse      PAST SURGICAL HISTORY: Past Surgical History:  Procedure Laterality Date  . ABDOMINAL HYSTERECTOMY  1985   partial  . cataract surgery Left   . COLONOSCOPY N/A 04/28/2013   Procedure: COLONOSCOPY;  Surgeon: Patrick D Hung, MD;  Location: [facility];  Service: Endoscopy;  Laterality: N/A;  . CORONARY ANGIOPLASTY WITH STENT PLACEMENT  2012   x 1  . FINGER FRACTURE SURGERY Left    reduction with  splint  . knot drained and removed from side of face  2007  . TONSILLECTOMY  1980    FAMILY HISTORY: The patient family history includes Breast cancer in her cousin; Cancer in her mother; Coronary artery disease in an other family member; Diabetes in her brother and sister; Heart attack in her maternal grandfather; Hypertension in her maternal grandfather and sister; Stroke in her brother.  SOCIAL HISTORY:  The patient  reports that she has been smoking cigarettes. She has a 20.00 pack-year smoking history. She has never used smokeless tobacco. She reports current drug use. She reports that she does not drink alcohol.  REVIEW OF SYSTEMS: Review of Systems  Constitutional: Negative for chills and fever.  HENT: Negative for hoarse voice and nosebleeds.   Eyes: Negative for discharge, double vision and pain.  Cardiovascular: Positive for dyspnea on exertion (stable). Negative for chest pain, claudication, leg swelling, near-syncope, orthopnea, palpitations, paroxysmal nocturnal dyspnea and syncope.  Respiratory: Negative for hemoptysis and shortness of breath.   Musculoskeletal: Positive for joint pain. Negative for muscle cramps and myalgias.  Gastrointestinal: Negative for abdominal pain, constipation, diarrhea, hematemesis, hematochezia, melena, nausea and vomiting.  Neurological: Negative for dizziness and light-headedness.    PHYSICAL EXAM: Vitals with BMI 07/08/2020 04/09/2020 02/15/2020  Height 5' 0" 5' 0" -  Weight 140 lbs 10 oz 146 lbs 13 oz -  BMI 27.46 28.67 -  Systolic 134  150 146  Diastolic 72 78 82  Pulse 79 69 76  Some encounter information is confidential and restricted. Go to Review Flowsheets activity to see all data.    CONSTITUTIONAL: Well-developed and well-nourished. No acute distress.  SKIN: Skin is warm and dry. No rash noted. No cyanosis. No pallor. No jaundice HEAD: Normocephalic and atraumatic.  EYES: No scleral icterus MOUTH/THROAT: Moist oral membranes.  NECK: No JVD present. No thyromegaly noted. No carotid bruits  LYMPHATIC: No visible cervical adenopathy.  CHEST Normal respiratory effort. No intercostal retractions  LUNGS: Clear to auscultation bilaterally.  No stridor. No wheezes. No rales.  CARDIOVASCULAR: Regular rate and rhythm, positive S1-S2, no murmurs rubs or gallops appreciated. ABDOMINAL: No apparent ascites.  EXTREMITIES: No peripheral edema  HEMATOLOGIC: No significant bruising NEUROLOGIC: Oriented to person, place, and time. Nonfocal. Normal muscle tone.  PSYCHIATRIC: Normal mood and affect. Normal behavior. Cooperative  CARDIAC DATABASE: EKG: 02/15/2020: Normal sinus rhythm, 71 bpm normal axis, left atrial enlargement, poor R wave progression, without underlying injury pattern.  Echocardiogram: 02/19/2020: Normal   LV systolic function with visual EF 60-65%. Left ventricle cavity is normal in size. Normal global wall motion. Normal diastolic filling pattern, normal LAP.  Mild (Grade I) mitral regurgitation. Small pericardial effusion. There is no hemodynamic significance. Compared to prior study dated 02/2015: MR was trace now mild and small pericardial effusion is new. Otherwise, no other significant changes noted.   Stress Testing: 04/10/2020: Lexiscan nuclear stress test performed using 1-day protocol.Normal myocardial perfusion. Calculated stress LVEF 46%, although visually appears normal. Recommend clinical correlation. Low risk study.    Heart Catheterization: 02/2011: LHC with EF 55-60%, basal inferior HK, 99%  mRCA stenosis, 40% OM1.  Patient had Promus DES to RCA  Carotid duplex: Carotid artery duplex 04/16/2020: Stenosis in the right internal carotid artery (16-49%). Stenosis in the left internal carotid artery (16-49%). Antegrade right vertebral artery flow. Antegrade left vertebral artery flow. Follow up in one year is appropriate if clinically indicated  Cardiac Monitor: 02/2015: NSR, rare PVCs, no significant arrhythmias..  LABORATORY DATA: CBC Latest Ref Rng & Units 06/10/2015 03/03/2015 03/22/2013  WBC 4.0 - 10.5 K/uL 4.4 6.6 5.8  Hemoglobin 12.0 - 15.0 g/dL 12.2 13.4 13.1  Hematocrit 36.0 - 46.0 % 36.8 40.5 38.3  Platelets 150 - 400 K/uL 222 250 290    CMP Latest Ref Rng & Units 01/30/2019 11/01/2017 03/17/2017  Glucose 65 - 99 mg/dL 104(H) 129(H) 90  BUN 8 - 27 mg/dL 8 19 12  Creatinine 0.57 - 1.00 mg/dL 0.79 0.96 0.75  Sodium 134 - 144 mmol/L 140 142 143  Potassium 3.5 - 5.2 mmol/L 4.5 4.4 4.1  Chloride 96 - 106 mmol/L 101 105 103  CO2 20 - 29 mmol/L 24 23 26  Calcium 8.7 - 10.3 mg/dL 10.6(H) 9.9 10.3  Total Protein 6.0 - 8.5 g/dL 7.3 - 7.4  Total Bilirubin 0.0 - 1.2 mg/dL 0.3 - 0.3  Alkaline Phos 39 - 117 IU/L 81 - 84  AST 0 - 40 IU/L 12 - 19  ALT 0 - 32 IU/L 10 - 11    Lipid Panel  Lab Results  Component Value Date   CHOL 152 01/30/2019   HDL 38 (L) 01/30/2019   LDLCALC 96 01/30/2019   TRIG 95 01/30/2019   CHOLHDL 4.0 01/30/2019   No components found for: NTPROBNP No results for input(s): PROBNP in the last 8760 hours. No results for input(s): TSH in the last 8760 hours.  BMP No results for input(s): NA, K, CL, CO2, GLUCOSE, BUN, CREATININE, CALCIUM, GFRNONAA, GFRAA in the last 8760 hours.  HEMOGLOBIN A1C Lab Results  Component Value Date   HGBA1C 6.1 01/30/2019   MPG 134 03/04/2015   External Labs: Collected: 02/24/2020 Creatinine 1.04 mg/dL. eGFR: 65 mL/min per 1.73 m Lipid profile: Total cholesterol 145, triglycerides 91, HDL 40, LDL 86, non-HDL  105 TSH: 1.96  IMPRESSION:    ICD-10-CM   1. Atherosclerosis of native coronary artery of native heart without angina pectoris  I25.10 clopidogrel (PLAVIX) 75 MG tablet  2. History of coronary angioplasty with insertion of stent  Z95.5   3. Hx of non-ST elevation myocardial infarction (NSTEMI)  I25.2   4. Bilateral carotid artery stenosis  I65.23   5. HYPERTENSION, BENIGN ESSENTIAL  I10   6. Pure hypercholesterolemia  E78.00   7. Other form of dyspnea  R06.09   8. Tobacco abuse  Z72.0      RECOMMENDATIONS: Anjanae B Chopin is a 65 y.o. female whose past medical history and cardiac risk factors include: Hx   of NSTEMI, CAD s/p PCI to RCA with DES, Hyperlipidemia, HTN, Smoking, Hx of CVA (11/12 right external capsule), NIDDM, Hx of vasovagal syncope, postmenopausal female, advanced age.  Atherosclerosis of the native coronary arteries without angina pectoris with prior angioplasty and stenting:  Doing well from a cardiovascular standpoint.  No active chest pain.  Over the course of 3 months she used 1 tablet of sublingual nitroglycerin tablet cannot recall the details of that episode.   Medications reconciled.  Educated on the importance of complete smoking cessation.  She has lost 6 pounds since last office visit for which she is congratulated for today's visit.  Educated on the importance of improving her modifiable cardiovascular risk factors for secondary prevention of CAD.  Benign essential hypertension:  Ambulatory blood pressure monitoring results reviewed.  Average blood pressure over the last 2 weeks 145/87 mmHg.  Medications reconciled.  Low-salt diet recommended.  Educated on increasing physical activity to 30 minutes a day 5 days a week.  Will reach out in one month to follow up on her BP readings.   History of non-STEMI: Continue dual antiplatelet therapy and statin therapy.  Dyspnea on exertion: Chronic and stable.  Monitor for now.  Asymptomatic  bilateral carotid artery atherosclerosis: Continue aspirin and statin therapy.  Recheck carotid duplex in 1 year.  Hypercholesterolemia: Most recent lipid profile reviewed.  LDL currently not at goal.  Recommend a goal LDL of less than 70 mg/dL given her underlying diabetes.  Currently managed by primary care provider.  Active Smoking:  Tobacco cessation counseling: Currently smoking 0.33 packs/day   Patient was informed of the dangers of tobacco abuse including stroke, cancer, and MI, as well as benefits of tobacco cessation. Patient is willing to quit at this time. Approximately 5 mins were spent counseling patient cessation techniques. We discussed various methods to help quit smoking, including deciding on a date to quit, joining a support group, pharmacological agents- nicotine gum/patch/lozenges, chantix.   I will reassess her progress at the next follow-up visit  FINAL MEDICATION LIST END OF ENCOUNTER: Meds ordered this encounter  Medications  . clopidogrel (PLAVIX) 75 MG tablet    Sig: Take 1 tablet (75 mg total) by mouth daily. Please schedule an appointment.    Dispense:  90 tablet    Refill:  2    Must have office visit for refills     Current Outpatient Medications:  .  albuterol (VENTOLIN HFA) 108 (90 Base) MCG/ACT inhaler, INHALE 2 PUFFS EVERY 6 HOURS AS NEEDED FOR WHEEZING OR SHORTNESS OF BREATH. Must have office visit for refills, Disp: 18 g, Rfl: 0 .  amLODipine (NORVASC) 10 MG tablet, Take 1 tablet (10 mg total) by mouth daily at 10 pm., Disp: , Rfl:  .  aspirin 81 MG chewable tablet, CHEW AND SWALLOW 1 TABLET (81 MG TOTAL) BY MOUTH DAILY AFTER LUNCH., Disp: 90 tablet, Rfl: 0 .  atorvastatin (LIPITOR) 40 MG tablet, Take 1 tablet (40 mg total) by mouth daily. Must have office visit for refills, Disp: 90 tablet, Rfl: 0 .  citalopram (CELEXA) 10 MG tablet, Take 1 tablet (10 mg total) by mouth daily., Disp: 30 tablet, Rfl: 2 .  Elastic Bandages & Supports (MEDICAL  COMPRESSION STOCKINGS) MISC, 1 Package by Does not apply route daily. Dispense 1 pair, to fit, wear daily, take off at night, Disp: 1 each, Rfl: 0 .  losartan (COZAAR) 100 MG tablet, TAKE 1 TABLET EVERY DAY, Disp: 30 tablet, Rfl: 0 .  metFORMIN (GLUCOPHAGE)   500 MG tablet, Take 1 tablet (500 mg total) by mouth 2 (two) times daily with a meal., Disp: 180 tablet, Rfl: 0 .  metoprolol succinate (TOPROL-XL) 50 MG 24 hr tablet, Take 1 tablet (50 mg total) by mouth daily. Take with or immediately following a meal., Disp: 90 tablet, Rfl: 2 .  nitroGLYCERIN (NITROSTAT) 0.4 MG SL tablet, DISSOLVE ONE TABLET UNDER THE TONGUE EVERY 5 MINUTES AS NEEDED FOR CHEST PAIN.  DO NOT EXCEED A TOTAL OF 3 DOSES IN 15 MINUTES, Disp: 25 tablet, Rfl: 0 .  clopidogrel (PLAVIX) 75 MG tablet, Take 1 tablet (75 mg total) by mouth daily. Please schedule an appointment., Disp: 90 tablet, Rfl: 2  No orders of the defined types were placed in this encounter.   There are no Patient Instructions on file for this visit.   --Continue cardiac medications as reconciled in final medication list. --Return in about 6 months (around 01/08/2021) for Follow up, CAD, carotid disease. Or sooner if needed. --Continue follow-up with your primary care physician regarding the management of your other chronic comorbid conditions.  Patient's questions and concerns were addressed to her satisfaction. She voices understanding of the instructions provided during this encounter.   This note was created using a voice recognition software as a result there may be grammatical errors inadvertently enclosed that do not reflect the nature of this encounter. Every attempt is made to correct such errors.  Rex Kras, [place], [facility]  Pager: [phone] Office: [phone]

## 2020-07-08 ENCOUNTER — Ambulatory Visit: Payer: Medicare HMO | Admitting: Cardiology

## 2020-07-08 ENCOUNTER — Other Ambulatory Visit: Payer: Self-pay

## 2020-07-08 ENCOUNTER — Encounter: Payer: Self-pay | Admitting: Cardiology

## 2020-07-08 VITALS — BP 134/72 | HR 79 | Temp 98.0°F | Resp 16 | Ht 60.0 in | Wt 140.6 lb

## 2020-07-08 DIAGNOSIS — E7800 Pure hypercholesterolemia, unspecified: Secondary | ICD-10-CM

## 2020-07-08 DIAGNOSIS — I252 Old myocardial infarction: Secondary | ICD-10-CM

## 2020-07-08 DIAGNOSIS — R0609 Other forms of dyspnea: Secondary | ICD-10-CM

## 2020-07-08 DIAGNOSIS — Z955 Presence of coronary angioplasty implant and graft: Secondary | ICD-10-CM

## 2020-07-08 DIAGNOSIS — I10 Essential (primary) hypertension: Secondary | ICD-10-CM

## 2020-07-08 DIAGNOSIS — Z720 Tobacco use: Secondary | ICD-10-CM

## 2020-07-08 DIAGNOSIS — I6523 Occlusion and stenosis of bilateral carotid arteries: Secondary | ICD-10-CM

## 2020-07-08 DIAGNOSIS — I2510 Atherosclerotic heart disease of native coronary artery without angina pectoris: Secondary | ICD-10-CM

## 2020-07-08 MED ORDER — CLOPIDOGREL BISULFATE 75 MG PO TABS: 75.0000 mg | ORAL_TABLET | Freq: Every day | ORAL | 2 refills | Status: DC

## 2020-10-23 ENCOUNTER — Telehealth (INDEPENDENT_AMBULATORY_CARE_PROVIDER_SITE_OTHER): Payer: Medicare HMO | Admitting: Psychiatry

## 2020-10-23 ENCOUNTER — Other Ambulatory Visit: Payer: Self-pay

## 2020-10-23 DIAGNOSIS — F325 Major depressive disorder, single episode, in full remission: Secondary | ICD-10-CM

## 2020-10-23 MED ORDER — CITALOPRAM HYDROBROMIDE 10 MG PO TABS: 10.0000 mg | ORAL_TABLET | Freq: Every day | ORAL | 2 refills | Status: DC

## 2020-10-23 NOTE — Progress Notes (Signed)
5 view Patient ID: Vanessa Proctor, female   DOB: 08-28-1955, 65 y.o.   MRN: [id number]  Psychiatric Initial Adult Assessment   Patient Identification: Vanessa Proctor MRN:  [id number] Date of Evaluation:  10/23/2020 Referral Source: Self-referred Chief Complaint: Weight loss  Visit Diagnosis: Bipolar disorder, type 1 Major depression remissio    Today once again the patient is doing very well.  She is very resilient.  Her biggest issue is that she takes care of her godmother who is 67 years of age and has dementia.  She goes over to her house at least 3 days a week and she spends 1 night there.  Her godmother does have other family members and the patient wonders why they are not more involved.  Patient's own children are not happy that she is spending so much time caring for her godmother.  The patient is not to drink during the summer.  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  She is not sure she wants to do that.  The patient is also some difficulty with her car.  Mainly into the fact that she was driving and that her insurance had lapsed.  She got pulled over because her tags was not active.  The patient has not taken today.  She is taking care of all of eventually getting back tags and the ability to drive in the next week.  The patient says she is sleeping and eating very well.  She is got good energy.  She has 6 grandchildren and 3 great-grandchildren and they are doing fairly well.  The patient has problems with her blood pressure but now it is better.  The patient is also almost 40 pounds in the last year.  This is probably because of a change in her diet and the change of some of her medicines.  She now weighs 142 pounds.  Patient enjoys the television a great deal.  She only gets 1 station.  She likes to read.  The patient is  functioning amazingly well.  She likes where she lives.  The patient likes to walk. PTSD Symptoms:   Past Psychiatric History: Psychiatric hospitalization at [facility] in 1999.  Previous Psychotropic Medications: Yes   Substance Abuse History in the last 12 months:  Yes.    Consequences of Substance Abuse:   Past Medical History:  Past Medical History:  Diagnosis Date   Arthritis    knees   Constipation last month   Coronary artery disease    Depression    Drug abuse ([place])    Hyperlipidemia    Hypertension    Myocardial infarction [facility])    november 1st 2012   Nausea & vomiting last month   Rectal bleeding    Stroke [facility])    november, 2012   Tobacco abuse     Past Surgical History:  Procedure Laterality Date   ABDOMINAL HYSTERECTOMY  1985   partial   cataract surgery Left    COLONOSCOPY N/A 04/28/2013   Procedure: COLONOSCOPY;  Surgeon: Beryle Beams, MD;  Location: [facility];  Service: Endoscopy;  Laterality: N/A;   CORONARY ANGIOPLASTY WITH STENT PLACEMENT  2012   x 1   FINGER FRACTURE SURGERY Left    reduction with  splint   knot drained and removed from side of face  2007   TONSILLECTOMY  1980    Family Psychiatric History:   Family History:  Family History  Problem Relation Age of Onset   Cancer Mother        mass in abdomen   Hypertension Sister    Diabetes Sister    Coronary artery disease Other    Stroke Brother    Diabetes Brother    Heart attack Maternal Grandfather    Hypertension Maternal Grandfather    Breast cancer Cousin     Social History:   Social History   Socioeconomic History   Marital status: Single    Spouse name: Not on file   Number of children: 2   Years of education: Not on file   Highest education level: Not on file  Occupational History   Not on file  Tobacco Use   Smoking status: Every Day    Packs/day: 0.50    Years: 40.00    Pack years: 20.00    Types: Cigarettes   Smokeless tobacco: Never   Vaping Use   Vaping Use: Never used  Substance and Sexual Activity   Alcohol use: No    Alcohol/week: 0.0 standard drinks   Drug use: Yes    Comment: marijuana occassionaly   Sexual activity: Not Currently  Other Topics Concern   Not on file  Social History Narrative   Not on file   Social Determinants of Health   Financial Resource Strain: Not on file  Food Insecurity: Not on file  Transportation Needs: Not on file  Physical Activity: Not on file  Stress: Not on file  Social Connections: Not on file    Additional Social History:   Allergies:   Allergies  Allergen Reactions   Bactrim Rash   Sulfa Antibiotics Rash    Metabolic Disorder Labs: Lab Results  Component Value Date   HGBA1C 6.1 01/30/2019   MPG 134 03/04/2015   MPG 128 (H) 03/09/2011   No results found for: PROLACTIN Lab Results  Component Value Date   CHOL 152 01/30/2019   TRIG 95 01/30/2019   HDL 38 (L) 01/30/2019   CHOLHDL 4.0 01/30/2019   VLDL 36 (H) 02/04/2016   LDLCALC 96 01/30/2019   LDLCALC 73 03/17/2017     Current Medications: Current Outpatient Medications  Medication Sig Dispense Refill   albuterol (VENTOLIN HFA) 108 (90 Base) MCG/ACT inhaler INHALE 2 PUFFS EVERY 6 HOURS AS NEEDED FOR WHEEZING OR SHORTNESS OF BREATH. Must have office visit for refills 18 g 0   amLODipine (NORVASC) 10 MG tablet Take 1 tablet (10 mg total) by mouth daily at 10 pm.     aspirin 81 MG chewable tablet CHEW AND SWALLOW 1 TABLET (81 MG TOTAL) BY MOUTH DAILY AFTER LUNCH. 90 tablet 0   atorvastatin (LIPITOR) 40 MG tablet Take 1 tablet (40 mg total) by mouth daily. Must have office visit for refills 90 tablet 0   citalopram (CELEXA) 10 MG tablet Take 1 tablet (10 mg total) by mouth daily. 90 tablet 2   clopidogrel (PLAVIX) 75 MG tablet Take 1 tablet (75 mg total) by mouth daily. Please schedule an appointment. 90 tablet 2   Elastic Bandages & Supports (MEDICAL COMPRESSION STOCKINGS) MISC 1 Package by Does not  apply route daily. Dispense 1 pair, to fit, wear daily, take off at night 1 each 0   losartan (COZAAR) 100 MG tablet TAKE 1 TABLET EVERY DAY 30 tablet 0   metFORMIN (GLUCOPHAGE) 500 MG tablet Take 1 tablet (500  mg total) by mouth 2 (two) times daily with a meal. 180 tablet 0   metoprolol succinate (TOPROL-XL) 50 MG 24 hr tablet Take 1 tablet (50 mg total) by mouth daily. Take with or immediately following a meal. 90 tablet 2   nitroGLYCERIN (NITROSTAT) 0.4 MG SL tablet DISSOLVE ONE TABLET UNDER THE TONGUE EVERY 5 MINUTES AS NEEDED FOR CHEST PAIN.  DO NOT EXCEED A TOTAL OF 3 DOSES IN 15 MINUTES 25 tablet 0   No current facility-administered medications for this visit.    Neurologic: Headache: No Seizure: No Paresthesias:No  Musculoskeletal: Strength & Muscle Tone: within normal limits Gait & Station: normal Patient leans  Psychiatric Specialty Exam: ROS  There were no vitals taken for this visit.There is no height or weight on file to calculate BMI.  General Appearance: Casual  Eye Contact:  Good  Speech:  Clear and Coherent  Volume:  Normal  Mood:  Negative  Affect:  Appropriate  Thought Process:  Coherent  Orientation:  Full (Time, Place, and Person)  Thought Content:  WDL  Suicidal Thoughts:  No  Homicidal Thoughts:  No  Memory:  Negative  Judgement:  Good  Insight:  Good  Psychomotor Activity:  Normal  Concentration:  Good  Recall:  Good  Fund of Knowledge:Good  Language: Fair  Akathisia:  No  Handed:  Right  AIMS (if indicated):    Assets:  Desire for Improvement  ADL's:  Intact  Cognition: WNL  Sleep:     Treatment Plan Summary: 7/13/20222:35 PM At this time the patient is very stable.  She is diagnosed with major clinical depression and takes 10 mg of Celexa.  The patient is sleeping and eating well.  She has no vegetative symptoms.  She is functioning very well.  She is trying to make decisions she wants to continue caring for her godmother.  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  She plans to take at least a week off in the next few months to go visit an aunt.  The patient agrees to return to see me in person in 5 months.

## 2020-11-04 ENCOUNTER — Other Ambulatory Visit: Payer: Self-pay | Admitting: Family Medicine

## 2020-11-04 DIAGNOSIS — Z1231 Encounter for screening mammogram for malignant neoplasm of breast: Secondary | ICD-10-CM

## 2020-11-20 ENCOUNTER — Ambulatory Visit
Admission: RE | Admit: 2020-11-20 | Discharge: 2020-11-20 | Disposition: A | Discharge status: Home / Self Care | Payer: Medicare HMO | Source: Ambulatory Visit | Attending: Family Medicine | Admitting: Family Medicine

## 2020-11-20 ENCOUNTER — Other Ambulatory Visit: Payer: Self-pay

## 2020-11-20 DIAGNOSIS — Z1231 Encounter for screening mammogram for malignant neoplasm of breast: Secondary | ICD-10-CM

## 2021-01-09 ENCOUNTER — Ambulatory Visit: Payer: Medicare HMO | Admitting: Cardiology

## 2021-01-14 ENCOUNTER — Other Ambulatory Visit: Payer: Self-pay

## 2021-01-14 ENCOUNTER — Encounter: Payer: Self-pay | Admitting: Cardiology

## 2021-01-14 ENCOUNTER — Ambulatory Visit: Payer: Medicare HMO | Admitting: Cardiology

## 2021-01-14 VITALS — BP 115/72 | HR 72 | Temp 98.1°F | Resp 16 | Ht 60.0 in | Wt 133.4 lb

## 2021-01-14 DIAGNOSIS — I6523 Occlusion and stenosis of bilateral carotid arteries: Secondary | ICD-10-CM

## 2021-01-14 DIAGNOSIS — I10 Essential (primary) hypertension: Secondary | ICD-10-CM

## 2021-01-14 DIAGNOSIS — E7800 Pure hypercholesterolemia, unspecified: Secondary | ICD-10-CM

## 2021-01-14 DIAGNOSIS — Z955 Presence of coronary angioplasty implant and graft: Secondary | ICD-10-CM

## 2021-01-14 DIAGNOSIS — I2510 Atherosclerotic heart disease of native coronary artery without angina pectoris: Secondary | ICD-10-CM

## 2021-01-14 DIAGNOSIS — F17210 Nicotine dependence, cigarettes, uncomplicated: Secondary | ICD-10-CM

## 2021-01-14 DIAGNOSIS — I252 Old myocardial infarction: Secondary | ICD-10-CM

## 2021-01-14 NOTE — Progress Notes (Signed)
ID:  BEE MARCHIANO, DOB Oct 27, 1955, MRN [id number]  PCP:  Andree Moro, DO  Cardiologist: Rex Kras, DO, [facility] (established care 02/15/2020) Former Cardiology Providers: Dr. Loralie Champagne, Truitt Merle, RN, ANP-C  Date: 01/14/21 Last Office Visit: 07/08/2020   Chief Complaint  Patient presents with   Coronary Artery Disease   Follow-up    6 months    HPI  Vanessa Proctor is a 65 y.o. female who presents to the office with a chief complaint of " 6 month follow up for CAD." Patient's past medical history and cardiovascular risk factors include: Hx of NSTEMI, CAD s/p PCI to RCA with DES, Hyperlipidemia, HTN, Smoking, Hx of CVA (11/12 right external capsule), Hx of vasovagal syncope, postmenopausal female, advanced age.  She is referred to the office at the request of her PCP for evaluation of hypertension and CAD.  Patient has history of coronary artery disease status post PCI to the RCA with a DES in the past.  Has undergone a recent ischemic evaluation as of 2021 results was reviewed again at today's office visit and noted below for further reference.  She presents today for 66-monthfollow-up.  From a cardiovascular standpoint patient remains relatively stable.  Patient states that she has required the use of 1 sublingual nitroglycerin tablet since her last office visit in March 2022.  She does not have any exertional chest pain or heart failure symptoms.  No hospitalizations or urgent care visits for cardiovascular symptoms.  Her dyspnea on exertion remains relatively stable.  Unfortunately, she has increased her smoking to 10 cigarettes/day compared to the last office visit.  She also states that she has lost approximately 48 pounds in a matter of 1.5-2 years unintentionally.  FUNCTIONAL STATUS: No structured exercise program or daily routine.   ALLERGIES: Allergies  Allergen Reactions   Bactrim Rash   Sulfa Antibiotics Rash    MEDICATION LIST PRIOR TO  VISIT: Current Meds  Medication Sig   albuterol (VENTOLIN HFA) 108 (90 Base) MCG/ACT inhaler INHALE 2 PUFFS EVERY 6 HOURS AS NEEDED FOR WHEEZING OR SHORTNESS OF BREATH. Must have office visit for refills   amLODipine (NORVASC) 10 MG tablet Take 1 tablet (10 mg total) by mouth daily at 10 pm.   aspirin 81 MG chewable tablet CHEW AND SWALLOW 1 TABLET (81 MG TOTAL) BY MOUTH DAILY AFTER LUNCH.   atorvastatin (LIPITOR) 40 MG tablet Take 1 tablet (40 mg total) by mouth daily. Must have office visit for refills   citalopram (CELEXA) 10 MG tablet Take 1 tablet (10 mg total) by mouth daily.   clopidogrel (PLAVIX) 75 MG tablet Take 1 tablet (75 mg total) by mouth daily. Please schedule an appointment.   Elastic Bandages & Supports (MEDICAL COMPRESSION STOCKINGS) MISC 1 Package by Does not apply route daily. Dispense 1 pair, to fit, wear daily, take off at night   losartan (COZAAR) 100 MG tablet TAKE 1 TABLET EVERY DAY   metFORMIN (GLUCOPHAGE) 500 MG tablet Take 1 tablet (500 mg total) by mouth 2 (two) times daily with a meal. (Patient taking differently: Take 500 mg by mouth at bedtime.)   metoprolol succinate (TOPROL-XL) 50 MG 24 hr tablet Take 1 tablet (50 mg total) by mouth daily. Take with or immediately following a meal.   nitroGLYCERIN (NITROSTAT) 0.4 MG SL tablet DISSOLVE ONE TABLET UNDER THE TONGUE EVERY 5 MINUTES AS NEEDED FOR CHEST PAIN.  DO NOT EXCEED A TOTAL OF 3 DOSES IN 15 MINUTES  PAST MEDICAL HISTORY: Past Medical History:  Diagnosis Date   Arthritis    knees   Constipation last month   Coronary artery disease    Depression    Drug abuse ([place])    Hyperlipidemia    Hypertension    Myocardial infarction [facility])    november 1st 2012   Nausea & vomiting last month   Rectal bleeding    Stroke [facility])    november, 2012   Tobacco abuse     PAST SURGICAL HISTORY: Past Surgical History:  Procedure Laterality Date   ABDOMINAL HYSTERECTOMY  1985   partial   cataract surgery Left     COLONOSCOPY N/A 04/28/2013   Procedure: COLONOSCOPY;  Surgeon: Beryle Beams, MD;  Location: [facility];  Service: Endoscopy;  Laterality: N/A;   CORONARY ANGIOPLASTY WITH STENT PLACEMENT  2012   x 1   FINGER FRACTURE SURGERY Left    reduction with  splint   knot drained and removed from side of face  2007   TONSILLECTOMY  1980    FAMILY HISTORY: The patient family history includes Breast cancer in her cousin; Cancer in her mother; Coronary artery disease in an other family member; Diabetes in her brother and sister; Heart attack in her maternal grandfather; Hypertension in her maternal grandfather and sister; Stroke in her brother.  SOCIAL HISTORY:  The patient  reports that she has been smoking cigarettes. She has a 20.00 pack-year smoking history. She has never used smokeless tobacco. She reports current drug use. She reports that she does not drink alcohol.  REVIEW OF SYSTEMS: Review of Systems  Constitutional: Negative for chills and fever.  HENT:  Negative for hoarse voice and nosebleeds.   Eyes:  Negative for discharge, double vision and pain.  Cardiovascular:  Positive for dyspnea on exertion (stable). Negative for chest pain, claudication, leg swelling, near-syncope, orthopnea, palpitations, paroxysmal nocturnal dyspnea and syncope.  Respiratory:  Negative for hemoptysis and shortness of breath.   Musculoskeletal:  Positive for joint pain. Negative for muscle cramps and myalgias.  Gastrointestinal:  Negative for abdominal pain, constipation, diarrhea, hematemesis, hematochezia, melena, nausea and vomiting.  Neurological:  Negative for dizziness and light-headedness.   PHYSICAL EXAM: Vitals with BMI 01/14/2021 07/08/2020 04/09/2020  Height _0  _1  _2   Weight 133 lbs 6 oz 140 lbs 10 oz 146 lbs 13 oz  BMI 26.05 76.16 07.37  Systolic 106 269 485  Diastolic 72 72 78  Pulse 72 79 69  Some encounter information is confidential and restricted. Go to Review Flowsheets  activity to see all data.    CONSTITUTIONAL: Well-developed and well-nourished. No acute distress.  SKIN: Skin is warm and dry. No rash noted. No cyanosis. No pallor. No jaundice HEAD: Normocephalic and atraumatic.  EYES: No scleral icterus MOUTH/THROAT: Moist oral membranes.  NECK: No JVD present. No thyromegaly noted. No carotid bruits  LYMPHATIC: No visible cervical adenopathy.  CHEST Normal respiratory effort. No intercostal retractions  LUNGS: Clear to auscultation bilaterally.  No stridor. No wheezes. No rales.  CARDIOVASCULAR: Regular rate and rhythm, positive S1-S2, no murmurs rubs or gallops appreciated. ABDOMINAL: Nonobese, soft, nontender, nondistended, positive bowel sounds in all 4 quadrants, no apparent ascites.  EXTREMITIES: No peripheral edema  HEMATOLOGIC: No significant bruising NEUROLOGIC: Oriented to person, place, and time. Nonfocal. Normal muscle tone.  PSYCHIATRIC: Normal mood and affect. Normal behavior. Cooperative  CARDIAC DATABASE: EKG: 01/14/2021: Sinus rhythm, 61 bpm, LAE, poor R wave progression,  without underlying injury pattern.  Echocardiogram: 02/19/2020: Normal LV systolic function with visual EF 60-65%. Left ventricle cavity is normal in size. Normal global wall motion. Normal diastolic filling pattern, normal LAP.  Mild (Grade I) mitral regurgitation. Small pericardial effusion. There is no hemodynamic significance. Compared to prior study dated 02/2015: MR was trace now mild and small pericardial effusion is new. Otherwise, no other significant changes noted.   Stress Testing: 04/10/2020: Lexiscan nuclear stress test performed using 1-day protocol.Normal myocardial perfusion. Calculated stress LVEF 46%, although visually appears normal. Recommend clinical correlation. Low risk study.   Heart Catheterization: 02/2011: LHC with EF 55-60%, basal inferior HK, 99% mRCA stenosis, 40% OM1.  Patient had Promus DES to RCA  Carotid duplex: Carotid  artery duplex 04/16/2020: Stenosis in the right internal carotid artery (16-49%). Stenosis in the left internal carotid artery (16-49%). Antegrade right vertebral artery flow. Antegrade left vertebral artery flow. Follow up in one year is appropriate if clinically indicated  Cardiac Monitor: 02/2015: NSR, rare PVCs, no significant arrhythmias.Marland Kitchen  LABORATORY DATA: CBC Latest Ref Rng & Units 06/10/2015 03/03/2015 03/22/2013  WBC 4.0 - 10.5 K/uL 4.4 6.6 5.8  Hemoglobin 12.0 - 15.0 g/dL 12.2 13.4 13.1  Hematocrit 36.0 - 46.0 % 36.8 40.5 38.3  Platelets 150 - 400 K/uL 222 250 290    CMP Latest Ref Rng & Units 01/30/2019 11/01/2017 03/17/2017  Glucose 65 - 99 mg/dL 104(H) 129(H) 90  BUN 8 - 27 mg/dL _0 Creatinine 0.57 - 1.00 mg/dL 0.79 0.96 0.75  Sodium 134 - 144 mmol/L 140 142 143  Potassium 3.5 - 5.2 mmol/L 4.5 4.4 4.1  Chloride 96 - 106 mmol/L 101 105 103  CO2 20 - 29 mmol/L _1 Calcium 8.7 - 10.3 mg/dL 10.6(H) 9.9 10.3  Total Protein 6.0 - 8.5 g/dL 7.3 - 7.4  Total Bilirubin 0.0 - 1.2 mg/dL 0.3 - 0.3  Alkaline Phos 39 - 117 IU/L 81 - 84  AST 0 - 40 IU/L 12 - 19  ALT 0 - 32 IU/L 10 - 11    Lipid Panel  Lab Results  Component Value Date   CHOL 152 01/30/2019   HDL 38 (L) 01/30/2019   LDLCALC 96 01/30/2019   TRIG 95 01/30/2019   CHOLHDL 4.0 01/30/2019   No components found for: NTPROBNP No results for input(s): PROBNP in the last 8760 hours. No results for input(s): TSH in the last 8760 hours.  BMP No results for input(s): NA, K, CL, CO2, GLUCOSE, BUN, CREATININE, CALCIUM, GFRNONAA, GFRAA in the last 8760 hours.  HEMOGLOBIN A1C Lab Results  Component Value Date   HGBA1C 6.1 01/30/2019   MPG 134 03/04/2015   External Labs: Collected: 02/24/2020 Creatinine 1.04 mg/dL. eGFR: 65 mL/min per 1.73 m Lipid profile: Total cholesterol 145, triglycerides 91, HDL 40, LDL 86, non-HDL 105 TSH: 1.96  IMPRESSION:    ICD-10-CM   1. Atherosclerosis of native  coronary artery of native heart without angina pectoris  I25.10 EKG 12-Lead    2. History of coronary angioplasty with insertion of stent  Z95.5     3. Hx of non-ST elevation myocardial infarction (NSTEMI)  I25.2     4. Bilateral carotid artery stenosis  I65.23     5. HYPERTENSION, BENIGN ESSENTIAL  I10     6. Pure hypercholesterolemia  E78.00     7. Continuous dependence on cigarette smoking  F17.210        RECOMMENDATIONS: Vanessa Proctor is a 65 y.o. female whose past medical  history and cardiac risk factors include: Hx of NSTEMI, CAD s/p PCI to RCA with DES, Hyperlipidemia, HTN, Smoking, Hx of CVA (11/12 right external capsule), NIDDM, Hx of vasovagal syncope, postmenopausal female, advanced age.  Atherosclerosis of native coronary artery of native heart s/p angioplasty/PCI without angina pectoris No active angina pectoralis. Has required the use of 1 sublingual nitroglycerin tablets over the last 6-7 months. EKG nonischemic Has recently undergone an echocardiogram and stress test results reviewed as part of today's encounter Educated on importance of secondary prevention. Monitor for now  Bilateral carotid artery stenosis Continue dual antiplatelet therapy. Continue statin therapy. Patient has a repeat carotid duplex in January 2023.  HYPERTENSION, BENIGN ESSENTIAL Office blood pressure is at goal.  Medication reconciled.  Low-salt diet recommended. Monitor for now  Pure hypercholesterolemia Currently on atorvastatin.   She denies myalgia or other side effects. Most recent lipids dated 02/2020 reviewed as noted above. Patient plans to have repeat blood work in November 2022.  I have asked her to to send a copy to the office for reference. Currently managed by primary care provider.  Continuous dependence on cigarette smoking Tobacco cessation counseling: Currently smoking 10 cigarettes/day Patient was informed of the dangers of tobacco abuse including stroke,  cancer, and MI, as well as benefits of tobacco cessation. Patient is willing to quit at this time. 7 mins were spent counseling patient cessation techniques. We discussed various methods to help quit smoking, including deciding on a date to quit, joining a support group, pharmacological agents- nicotine gum/patch/lozenges.  I will reassess her progress at the next follow-up visit  Patient points out that she has lost approximately 48 pounds over the course of 1.5-2 years unintentionally.  She states that her age-appropriate cancer screening is up-to-date; however, has not been screened for lung cancer given her prolonged history of smoking.  I have asked her to follow-up with her PCP for additional evaluation and management.  Total time spent 35 minutes.   FINAL MEDICATION LIST END OF ENCOUNTER: No orders of the defined types were placed in this encounter.    Current Outpatient Medications:    albuterol (VENTOLIN HFA) 108 (90 Base) MCG/ACT inhaler, INHALE 2 PUFFS EVERY 6 HOURS AS NEEDED FOR WHEEZING OR SHORTNESS OF BREATH. Must have office visit for refills, Disp: 18 g, Rfl: 0   amLODipine (NORVASC) 10 MG tablet, Take 1 tablet (10 mg total) by mouth daily at 10 pm., Disp: , Rfl:    aspirin 81 MG chewable tablet, CHEW AND SWALLOW 1 TABLET (81 MG TOTAL) BY MOUTH DAILY AFTER LUNCH., Disp: 90 tablet, Rfl: 0   atorvastatin (LIPITOR) 40 MG tablet, Take 1 tablet (40 mg total) by mouth daily. Must have office visit for refills, Disp: 90 tablet, Rfl: 0   citalopram (CELEXA) 10 MG tablet, Take 1 tablet (10 mg total) by mouth daily., Disp: 90 tablet, Rfl: 2   clopidogrel (PLAVIX) 75 MG tablet, Take 1 tablet (75 mg total) by mouth daily. Please schedule an appointment., Disp: 90 tablet, Rfl: 2   Elastic Bandages & Supports (MEDICAL COMPRESSION STOCKINGS) MISC, 1 Package by Does not apply route daily. Dispense 1 pair, to fit, wear daily, take off at night, Disp: 1 each, Rfl: 0   losartan (COZAAR) 100 MG  tablet, TAKE 1 TABLET EVERY DAY, Disp: 30 tablet, Rfl: 0   metFORMIN (GLUCOPHAGE) 500 MG tablet, Take 1 tablet (500 mg total) by mouth 2 (two) times daily with a meal. (Patient taking differently: Take 500 mg by  mouth at bedtime.), Disp: 180 tablet, Rfl: 0   metoprolol succinate (TOPROL-XL) 50 MG 24 hr tablet, Take 1 tablet (50 mg total) by mouth daily. Take with or immediately following a meal., Disp: 90 tablet, Rfl: 2   nitroGLYCERIN (NITROSTAT) 0.4 MG SL tablet, DISSOLVE ONE TABLET UNDER THE TONGUE EVERY 5 MINUTES AS NEEDED FOR CHEST PAIN.  DO NOT EXCEED A TOTAL OF 3 DOSES IN 15 MINUTES, Disp: 25 tablet, Rfl: 0  Orders Placed This Encounter  Procedures   EKG 12-Lead     There are no Patient Instructions on file for this visit.   --Continue cardiac medications as reconciled in final medication list. --Return in about 6 months (around 07/15/2021) for Follow up, CAD and carotid artery disease. . Or sooner if needed. --Continue follow-up with your primary care physician regarding the management of your other chronic comorbid conditions.  Patient's questions and concerns were addressed to her satisfaction. She voices understanding of the instructions provided during this encounter.   This note was created using a voice recognition software as a result there may be grammatical errors inadvertently enclosed that do not reflect the nature of this encounter. Every attempt is made to correct such errors.  Rex Kras, [place], [facility]  Pager: [phone] Office: [phone]

## 2021-01-16 ENCOUNTER — Other Ambulatory Visit: Payer: Self-pay | Admitting: Registered Nurse

## 2021-01-16 DIAGNOSIS — Z122 Encounter for screening for malignant neoplasm of respiratory organs: Secondary | ICD-10-CM

## 2021-01-21 ENCOUNTER — Encounter (HOSPITAL_COMMUNITY): Payer: Self-pay

## 2021-02-10 ENCOUNTER — Encounter: Payer: Self-pay | Admitting: Pharmacist

## 2021-02-10 NOTE — Progress Notes (Signed)
CARE PLAN ENTRY  02/10/2021 Name: Vanessa Proctor MRN: [id number] DOB: [birth year]  Vanessa Proctor is enrolled in Remote Patient Monitoring/Principle Care Monitoring.  Date of Enrollment: 02/15/20 Supervising physician: Rex Kras Indication: HTN  Remote Readings: Compliant and Avg BP: 141/84, HR:65  Next scheduled OV: 07/17/21  Pharmacist Clinical Goal(s):  Over the next 90 days, patient will demonstrate Improved medication adherence as evidenced by medication fill history Over the next 90 days, patient will demonstrate improved understanding of prescribed medications and rationale for usage as evidenced by patient teach back Over the next 90 days, patient will experience decrease in ED visits. ED visits in last 6 months = 0 Over the next 90 days, patient will not experience hospital admission. Hospital Admissions in last 6 months = 0  Interventions: Provider and Inter-disciplinary care team collaboration (see longitudinal plan of care) Comprehensive medication review performed. Discussed plans with patient for ongoing care management follow up and provided patient with direct contact information for care management team Collaboration with provider re: medication management  Patient Self Care Activities:  Self administers medications as prescribed Attends all scheduled provider appointments Performs ADL's independently Performs IADL's independently  Allergies  Allergen Reactions   Bactrim Rash   Sulfa Antibiotics Rash   Outpatient Encounter Medications as of 02/10/2021  Medication Sig   albuterol (VENTOLIN HFA) 108 (90 Base) MCG/ACT inhaler INHALE 2 PUFFS EVERY 6 HOURS AS NEEDED FOR WHEEZING OR SHORTNESS OF BREATH. Must have office visit for refills   amLODipine (NORVASC) 10 MG tablet Take 1 tablet (10 mg total) by mouth daily at 10 pm.   aspirin 81 MG chewable tablet CHEW AND SWALLOW 1 TABLET (81 MG TOTAL) BY MOUTH DAILY AFTER LUNCH.   atorvastatin (LIPITOR) 40  MG tablet Take 1 tablet (40 mg total) by mouth daily. Must have office visit for refills   citalopram (CELEXA) 10 MG tablet Take 1 tablet (10 mg total) by mouth daily.   clopidogrel (PLAVIX) 75 MG tablet Take 1 tablet (75 mg total) by mouth daily. Please schedule an appointment.   Elastic Bandages & Supports (MEDICAL COMPRESSION STOCKINGS) MISC 1 Package by Does not apply route daily. Dispense 1 pair, to fit, wear daily, take off at night   losartan (COZAAR) 100 MG tablet TAKE 1 TABLET EVERY DAY   metFORMIN (GLUCOPHAGE) 500 MG tablet Take 1 tablet (500 mg total) by mouth 2 (two) times daily with a meal. (Patient taking differently: Take 500 mg by mouth at bedtime.)   metoprolol succinate (TOPROL-XL) 50 MG 24 hr tablet Take 1 tablet (50 mg total) by mouth daily. Take with or immediately following a meal.   nitroGLYCERIN (NITROSTAT) 0.4 MG SL tablet DISSOLVE ONE TABLET UNDER THE TONGUE EVERY 5 MINUTES AS NEEDED FOR CHEST PAIN.  DO NOT EXCEED A TOTAL OF 3 DOSES IN 15 MINUTES   No facility-administered encounter medications on file as of 02/10/2021.    Hypertension   BP goal is:  <140/90  Office blood pressures are  BP Readings from Last 3 Encounters:  01/14/21 115/72  07/08/20 134/72  04/09/20 (!) 150/78    Patient is currently controlled on the following medications: metoprolol 50 mg, amlodipine 10 mg, losartan 100 mg,   Patient checks BP at home daily  Patient home BP readings are ranging: 127-153/72-95  Patient has tried  these meds in the past: HCTZ, lisinopril-HCTZ,   We discussed diet and exercise extensively  Plan  Continue current medications and control with diet and exercise  BP stable. Okay to continue current therapy. Will continue to monitor and follow up as needed.  ______________ Visit Information SDOH (Social Determinants of Health) assessments performed: Yes.  Vanessa Proctor was given information about Principle Care Management/Remote Patient Monitoring  services today including:  RPM/PCM service includes personalized support from designated clinical staff supervised by her physician, including individualized plan of care and coordination with other care providers 24/7 contact phone numbers for assistance for urgent and routine care needs. Standard insurance, coinsurance, copays and deductibles apply for principle care management only during months in which we provide at least 30 minutes of these services. Most insurances cover these services at 100%, however patients may be responsible for any copay, coinsurance and/or deductible if applicable. This service may help you avoid the need for more expensive face-to-face services. Only one practitioner may furnish and bill the service in a calendar month. The patient may stop PCM/RPM services at any time (effective at the end of the month) by phone call to the office staff.  Patient agreed to services and verbal consent obtained.   Manuela Schwartz, Pharm.D. [facility] [phone] Ext: [phone]

## 2021-03-19 ENCOUNTER — Inpatient Hospital Stay: Admission: RE | Admit: 2021-03-19 | Payer: Medicare HMO | Source: Ambulatory Visit

## 2021-03-26 ENCOUNTER — Other Ambulatory Visit: Payer: Self-pay

## 2021-03-26 ENCOUNTER — Telehealth (HOSPITAL_BASED_OUTPATIENT_CLINIC_OR_DEPARTMENT_OTHER): Payer: Medicare HMO | Admitting: Psychiatry

## 2021-03-26 DIAGNOSIS — F325 Major depressive disorder, single episode, in full remission: Secondary | ICD-10-CM

## 2021-03-26 MED ORDER — CITALOPRAM HYDROBROMIDE 10 MG PO TABS: 10.0000 mg | ORAL_TABLET | Freq: Every day | ORAL | 2 refills | Status: DC

## 2021-03-26 NOTE — Progress Notes (Signed)
5 view Patient ID: Vanessa Proctor, female   DOB: 1955/10/06, 65 y.o.   MRN: [id number]  Psychiatric Initial Adult Assessment   Patient Identification: Vanessa Proctor MRN:  [id number] Date of Evaluation:  03/26/2021 Referral Source: Self-referred Chief Complaint: Weight loss  Visit Diagnosis: Bipolar disorder, type 1 Major depression remissio  Today the patient is doing fairly well.  She made some important decisions.  She decided to stop taking care of her godmother.  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  The patient does have some financial difficulties.  She is still driving but there are some issues with her car.  She is looking forward to Christmas.  Most importantly however is the fact that the patient is slowly lost weight.  She does smoke cigarettes.  Her primary care doctor wanted her to get a lung scan but unfortunately she could not afford it and canceled the appointment.  The patient sees her primary care doctor tomorrow.  She will discuss it with him.  The patient does describe a cough.  It is somewhat concerning.  The patient denies daily depression.  She is sleeping pretty well.  She has had some adjustments made in her blood pressure medicine which she suspects has helped her sleep better.  Her appetite is only fair.  She got reasonably good amount of energy.  She does not drink any alcohol or use any drugs.  She still enjoys things.  She has a good sense of worth.  She certainly is not suicidal.  This patient will be seen again in person.  The patient is starting to go to Bible study which is good for her.  Virtual Visit via Telephone Note  I connected with [name] on 03/26/21 at  2:30 PM EST by telephone and verified that I am speaking with the correct person using two identifiers.  Location: Patient: home Provider: office   I discussed the limitations, risks, security and privacy concerns of performing an  evaluation and management service by telephone and the availability of in person appointments. I also discussed with the patient that there may be a patient responsible charge related to this service. The patient expressed understanding and agreed to proceed.       I discussed the assessment and treatment plan with the patient. The patient was provided an opportunity to ask questions and all were answered. The patient agreed with the plan and demonstrated an understanding of the instructions.   The patient was advised to call back or seek an in-person evaluation if the symptoms worsen or if the condition fails to improve as anticipated.  I provided 30 minutes of non-face-to-face time during this encounter.   Jerral Ralph, MD  Past Psychiatric History: Psychiatric hospitalization at [facility] regional in 1999.  Previous Psychotropic Medications: Yes   Substance Abuse History in the last 12 months:  Yes.    Consequences of Substance Abuse:   Past Medical History:  Past Medical History:  Diagnosis Date   Arthritis    knees   Constipation last month   Coronary artery disease    Depression    Drug abuse ([place])    Hyperlipidemia    Hypertension    Myocardial infarction [facility])    november 1st 2012   Nausea & vomiting last month   Rectal bleeding    Stroke [facility])    november, 2012   Tobacco abuse  Past Surgical History:  Procedure Laterality Date   ABDOMINAL HYSTERECTOMY  1985   partial   cataract surgery Left    COLONOSCOPY N/A 04/28/2013   Procedure: COLONOSCOPY;  Surgeon: Beryle Beams, MD;  Location: [facility];  Service: Endoscopy;  Laterality: N/A;   CORONARY ANGIOPLASTY WITH STENT PLACEMENT  2012   x 1   FINGER FRACTURE SURGERY Left    reduction with  splint   knot drained and removed from side of face  2007   TONSILLECTOMY  1980    Family Psychiatric History:   Family History:  Family History  Problem Relation Age of Onset   Cancer Mother         mass in abdomen   Hypertension Sister    Diabetes Sister    Coronary artery disease Other    Stroke Brother    Diabetes Brother    Heart attack Maternal Grandfather    Hypertension Maternal Grandfather    Breast cancer Cousin     Social History:   Social History   Socioeconomic History   Marital status: Single    Spouse name: Not on file   Number of children: 2   Years of education: Not on file   Highest education level: Not on file  Occupational History   Not on file  Tobacco Use   Smoking status: Every Day    Packs/day: 0.50    Years: 40.00    Pack years: 20.00    Types: Cigarettes   Smokeless tobacco: Never  Vaping Use   Vaping Use: Never used  Substance and Sexual Activity   Alcohol use: No    Alcohol/week: 0.0 standard drinks   Drug use: Yes    Comment: marijuana occassionaly   Sexual activity: Not Currently  Other Topics Concern   Not on file  Social History Narrative   Not on file   Social Determinants of Health   Financial Resource Strain: Not on file  Food Insecurity: Not on file  Transportation Needs: Not on file  Physical Activity: Not on file  Stress: Not on file  Social Connections: Not on file    Additional Social History:   Allergies:   Allergies  Allergen Reactions   Bactrim Rash   Sulfa Antibiotics Rash    Metabolic Disorder Labs: Lab Results  Component Value Date   HGBA1C 6.1 01/30/2019   MPG 134 03/04/2015   MPG 128 (H) 03/09/2011   No results found for: PROLACTIN Lab Results  Component Value Date   CHOL 152 01/30/2019   TRIG 95 01/30/2019   HDL 38 (L) 01/30/2019   CHOLHDL 4.0 01/30/2019   VLDL 36 (H) 02/04/2016   LDLCALC 96 01/30/2019   LDLCALC 73 03/17/2017     Current Medications: Current Outpatient Medications  Medication Sig Dispense Refill   albuterol (VENTOLIN HFA) 108 (90 Base) MCG/ACT inhaler INHALE 2 PUFFS EVERY 6 HOURS AS NEEDED FOR WHEEZING OR SHORTNESS OF BREATH. Must have office visit for refills 18 g  0   amLODipine (NORVASC) 10 MG tablet Take 1 tablet (10 mg total) by mouth daily at 10 pm.     aspirin 81 MG chewable tablet CHEW AND SWALLOW 1 TABLET (81 MG TOTAL) BY MOUTH DAILY AFTER LUNCH. 90 tablet 0   atorvastatin (LIPITOR) 40 MG tablet Take 1 tablet (40 mg total) by mouth daily. Must have office visit for refills 90 tablet 0   citalopram (CELEXA) 10 MG tablet Take 1 tablet (10 mg total) by mouth daily.  90 tablet 2   clopidogrel (PLAVIX) 75 MG tablet Take 1 tablet (75 mg total) by mouth daily. Please schedule an appointment. 90 tablet 2   Elastic Bandages & Supports (MEDICAL COMPRESSION STOCKINGS) MISC 1 Package by Does not apply route daily. Dispense 1 pair, to fit, wear daily, take off at night 1 each 0   losartan (COZAAR) 100 MG tablet TAKE 1 TABLET EVERY DAY 30 tablet 0   metFORMIN (GLUCOPHAGE) 500 MG tablet Take 1 tablet (500 mg total) by mouth 2 (two) times daily with a meal. (Patient taking differently: Take 500 mg by mouth at bedtime.) 180 tablet 0   metoprolol succinate (TOPROL-XL) 50 MG 24 hr tablet Take 1 tablet (50 mg total) by mouth daily. Take with or immediately following a meal. 90 tablet 2   nitroGLYCERIN (NITROSTAT) 0.4 MG SL tablet DISSOLVE ONE TABLET UNDER THE TONGUE EVERY 5 MINUTES AS NEEDED FOR CHEST PAIN.  DO NOT EXCEED A TOTAL OF 3 DOSES IN 15 MINUTES 25 tablet 0   No current facility-administered medications for this visit.    Neurologic: Headache: No Seizure: No Paresthesias:No  Musculoskeletal: Strength & Muscle Tone: within normal limits Gait & Station: normal Patient leans  Psychiatric Specialty Exam: ROS  There were no vitals taken for this visit.There is no height or weight on file to calculate BMI.  General Appearance: Casual  Eye Contact:  Good  Speech:  Clear and Coherent  Volume:  Normal  Mood:  Negative  Affect:  Appropriate  Thought Process:  Coherent  Orientation:  Full (Time, Place, and Person)  Thought Content:  WDL  Suicidal  Thoughts:  No  Homicidal Thoughts:  No  Memory:  Negative  Judgement:  Good  Insight:  Good  Psychomotor Activity:  Normal  Concentration:  Good  Recall:  Good  Fund of Knowledge:Good  Language: Fair  Akathisia:  No  Handed:  Right  AIMS (if indicated):    Assets:  Desire for Improvement  ADL's:  Intact  Cognition: WNL  Sleep:     Treatment Plan Summary: 12/14/20223:05 PM  Today the patient is doing only fairly well.  She does have some health issues.  She also has some financial issues.  Her diagnosis is major depression and she will continue taking Celexa 10 mg.  The patient is functioning reasonably well.  She will return to see me in 2 to 3 months in person.

## 2021-04-17 ENCOUNTER — Other Ambulatory Visit: Payer: Self-pay

## 2021-04-17 ENCOUNTER — Other Ambulatory Visit: Payer: Medicare HMO

## 2021-04-17 ENCOUNTER — Other Ambulatory Visit: Payer: Self-pay | Admitting: General Practice

## 2021-04-17 ENCOUNTER — Other Ambulatory Visit: Payer: Self-pay | Admitting: Cardiology

## 2021-04-17 ENCOUNTER — Ambulatory Visit: Payer: Medicare HMO

## 2021-04-17 DIAGNOSIS — I6523 Occlusion and stenosis of bilateral carotid arteries: Secondary | ICD-10-CM

## 2021-04-21 NOTE — Progress Notes (Signed)
Consider repeating it 1 more time if clinically indicated otherwise no indication, very minimal disease.

## 2021-06-24 ENCOUNTER — Telehealth (HOSPITAL_BASED_OUTPATIENT_CLINIC_OR_DEPARTMENT_OTHER): Payer: Medicare HMO | Admitting: Psychiatry

## 2021-06-24 ENCOUNTER — Other Ambulatory Visit: Payer: Self-pay

## 2021-06-24 ENCOUNTER — Other Ambulatory Visit: Payer: Self-pay | Admitting: Pharmacist

## 2021-06-24 DIAGNOSIS — F325 Major depressive disorder, single episode, in full remission: Secondary | ICD-10-CM | POA: Diagnosis not present

## 2021-06-24 MED ORDER — CITALOPRAM HYDROBROMIDE 10 MG PO TABS: 10.0000 mg | ORAL_TABLET | Freq: Every day | ORAL | 2 refills | Status: DC

## 2021-06-24 NOTE — Progress Notes (Signed)
5 view ?Patient ID: Vanessa Proctor, female   DOB: 12/01/1955, 66 y.o.   MRN: [id number] ? Psychiatric Initial Adult Assessment  ? ?Patient Identification: Vanessa Proctor ?MRN:  [id number] ?Date of Evaluation:  06/24/2021 ?Referral Source: Self-referred ?Chief Complaint: Weight loss  ?Visit Diagnosis: Bipolar disorder, type 1 ?Major depression remissio ? ? ? ? ? ?Today the patient is doing well.  She has set some limits on herself.  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  The patient spends a lot of time driving around her great grandchildren to school and back and forth.  She has a number of great-grandchildren and grandchildren that she helps out.  The patient has hypertension which is fairly well controlled.  She has had some chest pain episodes at least a couple times in the last few months that she is taking nitroglycerin.  She has an appointment with her primary care doctor in a few weeks.  The patient had a chronic cough which is somewhat better now.  She was recently seen for this cough and they determined that it was not related to an infection but rather related to allergies.  The patient has some issues with her feet and has surgery coming up.  The patient denies daily depression.  Her anxiety is well controlled.  She is sleeping and eating well.  She has gained a little weight.  The patient and her daughter have agreed to split the car payment for a car so that is good for the patient.  She feels less stressed around car.  Overall the patient is functioning very well.  She is positive and optimistic. ?Virtual Visit via Telephone Note ? ?I connected with [name] on 06/24/21 at  1:30 PM EDT by telephone and verified that I am speaking with the correct person using two identifiers. ? ?Location: ?Patient: home ?Provider: office ?  ?I discussed the limitations, risks, security and privacy concerns of performing an evaluation and  management service by telephone and the availability of in person appointments. I also discussed with the patient that there may be a patient responsible charge related to this service. The patient expressed understanding and agreed to proceed. ? ? ? ? ?  ?I discussed the assessment and treatment plan with the patient. The patient was provided an opportunity to ask questions and all were answered. The patient agreed with the plan and demonstrated an understanding of the instructions. ?  ?The patient was advised to call back or seek an in-person evaluation if the symptoms worsen or if the condition fails to improve as anticipated. ? ?I provided 30 minutes of non-face-to-face time during this encounter. ? ? ?Jerral Ralph, MD  ?Past Psychiatric History: Psychiatric hospitalization at [facility] in 1999. ? ?Previous Psychotropic Medications: Yes  ? ?Substance Abuse History in the last 12 months:  Yes.   ? ?Consequences of Substance Abuse: ? ? ?Past Medical History:  ?Past Medical History:  ?Diagnosis Date  ? Arthritis   ? knees  ? Constipation last month  ? Coronary artery disease   ? Depression   ? Drug abuse ([place])   ? Hyperlipidemia   ? Hypertension   ? Myocardial infarction [facility])   ? november 1st 2012  ? Nausea & vomiting last month  ? Rectal bleeding   ? Stroke [facility])   ? november, 2012  ? Tobacco abuse   ?  ?Past Surgical History:  ?  Procedure Laterality Date  ? ABDOMINAL HYSTERECTOMY  1985  ? partial  ? cataract surgery Left   ? COLONOSCOPY N/A 04/28/2013  ? Procedure: COLONOSCOPY;  Surgeon: Beryle Beams, MD;  Location: [facility];  Service: Endoscopy;  Laterality: N/A;  ? CORONARY ANGIOPLASTY WITH STENT PLACEMENT  2012  ? x 1  ? FINGER FRACTURE SURGERY Left   ? reduction with  splint  ? knot drained and removed from side of face  2007  ? TONSILLECTOMY  1980  ? ? ?Family Psychiatric History:  ? ?Family History:  ?Family History  ?Problem Relation Age of Onset  ? Cancer Mother   ?     mass in abdomen   ? Hypertension Sister   ? Diabetes Sister   ? Coronary artery disease Other   ? Stroke Brother   ? Diabetes Brother   ? Heart attack Maternal Grandfather   ? Hypertension Maternal Grandfather   ? Breast cancer Cousin   ? ? ?Social History:   ?Social History  ? ?Socioeconomic History  ? Marital status: Single  ?  Spouse name: Not on file  ? Number of children: 2  ? Years of education: Not on file  ? Highest education level: Not on file  ?Occupational History  ? Not on file  ?Tobacco Use  ? Smoking status: Every Day  ?  Packs/day: 0.50  ?  Years: 40.00  ?  Pack years: 20.00  ?  Types: Cigarettes  ? Smokeless tobacco: Never  ?Vaping Use  ? Vaping Use: Never used  ?Substance and Sexual Activity  ? Alcohol use: No  ?  Alcohol/week: 0.0 standard drinks  ? Drug use: Yes  ?  Comment: marijuana occassionaly  ? Sexual activity: Not Currently  ?Other Topics Concern  ? Not on file  ?Social History Narrative  ? Not on file  ? ?Social Determinants of Health  ? ?Financial Resource Strain: Not on file  ?Food Insecurity: Not on file  ?Transportation Needs: Not on file  ?Physical Activity: Not on file  ?Stress: Not on file  ?Social Connections: Not on file  ? ? ?Additional Social History:  ? ?Allergies:   ?Allergies  ?Allergen Reactions  ? Bactrim Rash  ? Sulfa Antibiotics Rash  ? ? ?Metabolic Disorder Labs: ?Lab Results  ?Component Value Date  ? HGBA1C 6.1 01/30/2019  ? MPG 134 03/04/2015  ? MPG 128 (H) 03/09/2011  ? ?No results found for: PROLACTIN ?Lab Results  ?Component Value Date  ? CHOL 152 01/30/2019  ? TRIG 95 01/30/2019  ? HDL 38 (L) 01/30/2019  ? CHOLHDL 4.0 01/30/2019  ? VLDL 36 (H) 02/04/2016  ? [place] 96 01/30/2019  ? [place] 73 03/17/2017  ? ? ? ?Current Medications: ?Current Outpatient Medications  ?Medication Sig Dispense Refill  ? albuterol (VENTOLIN HFA) 108 (90 Base) MCG/ACT inhaler INHALE 2 PUFFS EVERY 6 HOURS AS NEEDED FOR WHEEZING OR SHORTNESS OF BREATH. Must have office visit for refills 18 g 0  ? amLODipine  (NORVASC) 10 MG tablet Take 1 tablet (10 mg total) by mouth daily at 10 pm.    ? aspirin 81 MG chewable tablet CHEW AND SWALLOW 1 TABLET (81 MG TOTAL) BY MOUTH DAILY AFTER LUNCH. 90 tablet 0  ? atorvastatin (LIPITOR) 40 MG tablet Take 1 tablet (40 mg total) by mouth daily. Must have office visit for refills 90 tablet 0  ? citalopram (CELEXA) 10 MG tablet Take 1 tablet (10 mg total) by mouth daily. 90 tablet 2  ?  clopidogrel (PLAVIX) 75 MG tablet Take 1 tablet (75 mg total) by mouth daily. Please schedule an appointment. 90 tablet 2  ? Elastic Bandages & Supports (MEDICAL COMPRESSION STOCKINGS) MISC 1 Package by Does not apply route daily. Dispense 1 pair, to fit, wear daily, take off at night 1 each 0  ? levocetirizine (XYZAL) 5 MG tablet Take 5 mg by mouth at bedtime.    ? losartan (COZAAR) 100 MG tablet TAKE 1 TABLET EVERY DAY 30 tablet 0  ? metFORMIN (GLUCOPHAGE) 500 MG tablet Take 1 tablet (500 mg total) by mouth 2 (two) times daily with a meal. (Patient taking differently: Take 500 mg by mouth at bedtime.) 180 tablet 0  ? metoprolol succinate (TOPROL-XL) 50 MG 24 hr tablet Take 1 tablet (50 mg total) by mouth daily. Take with or immediately following a meal. 90 tablet 2  ? nitroGLYCERIN (NITROSTAT) 0.4 MG SL tablet DISSOLVE ONE TABLET UNDER THE TONGUE EVERY 5 MINUTES AS NEEDED FOR CHEST PAIN.  DO NOT EXCEED A TOTAL OF 3 DOSES IN 15 MINUTES 25 tablet 0  ? ?No current facility-administered medications for this visit.  ? ? ?Neurologic: ?Headache: No ?Seizure: No ?Paresthesias:No ? ?Musculoskeletal: ?Strength & Muscle Tone: within normal limits ?Gait & Station: normal ?Patient leans ? ?Psychiatric Specialty Exam: ?ROS  ?There were no vitals taken for this visit.There is no height or weight on file to calculate BMI.  ?General Appearance: Casual  ?Eye Contact:  Good  ?Speech:  Clear and Coherent  ?Volume:  Normal  ?Mood:  Negative  ?Affect:  Appropriate  ?Thought Process:  Coherent  ?Orientation:  Full (Time, Place,  and Person)  ?Thought Content:  WDL  ?Suicidal Thoughts:  No  ?Homicidal Thoughts:  No  ?Memory:  Negative  ?Judgement:  Good  ?Insight:  Good  ?Psychomotor Activity:  Normal  ?Concentration:  Good  ?Recall

## 2021-07-17 ENCOUNTER — Ambulatory Visit: Payer: Medicare HMO | Admitting: Cardiology

## 2021-07-29 ENCOUNTER — Encounter: Payer: Self-pay | Admitting: Cardiology

## 2021-07-29 ENCOUNTER — Ambulatory Visit: Payer: Medicare HMO | Admitting: Cardiology

## 2021-07-29 VITALS — BP 144/84 | HR 78 | Temp 98.0°F | Resp 16 | Ht 60.0 in | Wt 145.0 lb

## 2021-07-29 DIAGNOSIS — E7800 Pure hypercholesterolemia, unspecified: Secondary | ICD-10-CM

## 2021-07-29 DIAGNOSIS — I6521 Occlusion and stenosis of right carotid artery: Secondary | ICD-10-CM

## 2021-07-29 DIAGNOSIS — F17210 Nicotine dependence, cigarettes, uncomplicated: Secondary | ICD-10-CM

## 2021-07-29 DIAGNOSIS — I2510 Atherosclerotic heart disease of native coronary artery without angina pectoris: Secondary | ICD-10-CM

## 2021-07-29 DIAGNOSIS — I252 Old myocardial infarction: Secondary | ICD-10-CM

## 2021-07-29 DIAGNOSIS — I10 Essential (primary) hypertension: Secondary | ICD-10-CM

## 2021-07-29 DIAGNOSIS — E1159 Type 2 diabetes mellitus with other circulatory complications: Secondary | ICD-10-CM

## 2021-07-29 DIAGNOSIS — Z955 Presence of coronary angioplasty implant and graft: Secondary | ICD-10-CM

## 2021-07-29 MED ORDER — NICOTINE 7 MG/24HR TD PT24: 7.0000 mg | MEDICATED_PATCH | Freq: Every day | TRANSDERMAL | 0 refills | Status: AC

## 2021-07-29 MED ORDER — CLOPIDOGREL BISULFATE 75 MG PO TABS: 75.0000 mg | ORAL_TABLET | Freq: Every day | ORAL | 1 refills | Status: DC

## 2021-07-29 MED ORDER — CHLORTHALIDONE 15 MG PO TABS: 15.0000 mg | ORAL_TABLET | Freq: Every morning | ORAL | 0 refills | Status: DC

## 2021-07-29 MED ORDER — METOPROLOL SUCCINATE ER 50 MG PO TB24: 50.0000 mg | ORAL_TABLET | Freq: Every day | ORAL | 1 refills | Status: DC

## 2021-07-29 MED ORDER — NICOTINE POLACRILEX 4 MG MT LOZG: 4.0000 mg | LOZENGE | OROMUCOSAL | 0 refills | Status: AC | PRN

## 2021-07-29 NOTE — Progress Notes (Signed)
? ?ID:  Vanessa Proctor, DOB 09/16/1955, MRN [id number] ? ?PCP:  Gaylan Gerold, DO  ?Cardiologist: Rex Kras, DO, [facility] (established care 02/15/2020) ?Former Cardiology Providers: Dr. Loralie Champagne, Truitt Merle, RN, ANP-C ? ?Date: 07/29/21 ?Last Office Visit: 01/14/2021 ? ?Chief Complaint  ?Patient presents with  ? Coronary Artery Disease  ? Follow-up  ?  6 month  ? ? ?HPI  ?Vanessa Proctor is a 66 y.o. female whose past medical history and cardiovascular risk factors include: Hx of NSTEMI, CAD s/p PCI to RCA with DES, Hyperlipidemia, HTN, Smoking, Hx of CVA (11/12 right external capsule), Hx of vasovagal syncope, postmenopausal female, advanced age. ? ?She is referred to the office at the request of her PCP for evaluation of hypertension and CAD. ? ?She has known history of CAD status post DES to the RCA and has remained stable on current medical therapy.  She now presents for 42-monthfollow-up visit.  Over the last 6 months patient states that she is use submental nitroglycerin tablets once.  Denies angina pectoris or heart failure symptoms. ? ?With regards to blood pressure management patient is enrolled into principal care management for ambulatory blood pressure monitoring and her average blood pressures over the last 2 weeks and 146/90 with a pulse of 82 bpm.  Patient states that she is compliant with her current antihypertensive medications. ? ?Since last office visit patient also underwent carotid duplex which notes improvement in left ICA stenosis and the right ICA stenosis remains relatively stable. ? ?Unfortunately she continues to smoke 7 cigarettes/day but is very motivated to stop smoking. ? ?FUNCTIONAL STATUS: ?No structured exercise program or daily routine.  ? ?ALLERGIES: ?Allergies  ?Allergen Reactions  ? Bactrim Rash  ? Sulfa Antibiotics Rash  ? ? ?MEDICATION LIST PRIOR TO VISIT: ?Current Meds  ?Medication Sig  ? albuterol (VENTOLIN HFA) 108 (90 Base) MCG/ACT inhaler INHALE 2 PUFFS EVERY  6 HOURS AS NEEDED FOR WHEEZING OR SHORTNESS OF BREATH. Must have office visit for refills  ? amLODipine (NORVASC) 10 MG tablet Take 1 tablet (10 mg total) by mouth daily at 10 pm.  ? aspirin 81 MG chewable tablet CHEW AND SWALLOW 1 TABLET (81 MG TOTAL) BY MOUTH DAILY AFTER LUNCH.  ? atorvastatin (LIPITOR) 40 MG tablet Take 1 tablet (40 mg total) by mouth daily. Must have office visit for refills  ? chlorthalidone (HYGROTEN) 15 MG tablet Take 1 tablet (15 mg total) by mouth every morning.  ? citalopram (CELEXA) 10 MG tablet Take 1 tablet (10 mg total) by mouth daily.  ? Elastic Bandages & Supports (MEDICAL COMPRESSION STOCKINGS) MISC 1 Package by Does not apply route daily. Dispense 1 pair, to fit, wear daily, take off at night  ? levocetirizine (XYZAL) 5 MG tablet Take 5 mg by mouth at bedtime.  ? losartan (COZAAR) 100 MG tablet TAKE 1 TABLET EVERY DAY  ? metFORMIN (GLUCOPHAGE) 500 MG tablet Take 1 tablet (500 mg total) by mouth 2 (two) times daily with a meal. (Patient taking differently: Take 500 mg by mouth at bedtime.)  ? nicotine (NICODERM CQ - DOSED IN MG/24 HR) 7 mg/24hr patch Place 1 patch (7 mg total) onto the skin daily.  ? nicotine polacrilex (NICOTINE MINI) 4 MG lozenge Take 1 lozenge (4 mg total) by mouth as needed for smoking cessation.  ? nitroGLYCERIN (NITROSTAT) 0.4 MG SL tablet DISSOLVE ONE TABLET UNDER THE TONGUE EVERY 5 MINUTES AS NEEDED FOR CHEST PAIN.  DO NOT EXCEED A TOTAL OF 3 DOSES  IN 15 MINUTES  ? [DISCONTINUED] clopidogrel (PLAVIX) 75 MG tablet Take 1 tablet (75 mg total) by mouth daily. Please schedule an appointment.  ? [DISCONTINUED] metoprolol succinate (TOPROL-XL) 50 MG 24 hr tablet Take 1 tablet (50 mg total) by mouth daily. Take with or immediately following a meal.  ?  ? ?PAST MEDICAL HISTORY: ?Past Medical History:  ?Diagnosis Date  ? Arthritis   ? knees  ? Constipation last month  ? Coronary artery disease   ? Depression   ? Drug abuse ([place])   ? Hyperlipidemia   ? Hypertension    ? Myocardial infarction [facility])   ? november 1st 2012  ? Nausea & vomiting last month  ? Rectal bleeding   ? Stroke [facility])   ? november, 2012  ? Tobacco abuse   ? ? ?PAST SURGICAL HISTORY: ?Past Surgical History:  ?Procedure Laterality Date  ? ABDOMINAL HYSTERECTOMY  1985  ? partial  ? cataract surgery Left   ? COLONOSCOPY N/A 04/28/2013  ? Procedure: COLONOSCOPY;  Surgeon: Beryle Beams, MD;  Location: [facility];  Service: Endoscopy;  Laterality: N/A;  ? CORONARY ANGIOPLASTY WITH STENT PLACEMENT  2012  ? x 1  ? FINGER FRACTURE SURGERY Left   ? reduction with  splint  ? knot drained and removed from side of face  2007  ? TONSILLECTOMY  1980  ? ? ?FAMILY HISTORY: ?The patient family history includes Breast cancer in her cousin; Cancer in her mother; Coronary artery disease in an other family member; Diabetes in her brother and sister; Heart attack in her maternal grandfather; Hypertension in her maternal grandfather and sister; Stroke in her brother. ? ?SOCIAL HISTORY:  ?The patient  reports that she has been smoking cigarettes. She has a 20.00 pack-year smoking history. She has never used smokeless tobacco. She reports current drug use. She reports that she does not drink alcohol. ? ?REVIEW OF SYSTEMS: ?Review of Systems  ?Cardiovascular:  Negative for chest pain, claudication, dyspnea on exertion, leg swelling, near-syncope, orthopnea, palpitations, paroxysmal nocturnal dyspnea and syncope.  ? ?PHYSICAL EXAM: ? ?  07/29/2021  ?  3:27 PM 01/14/2021  ?  3:52 PM 07/08/2020  ? 10:23 AM  ?Vitals with BMI  ?Height '5\' 0"'$  '5\' 0"'$  '5\' 0"'$   ?Weight 145 lbs 133 lbs 6 oz 140 lbs 10 oz  ?BMI 28.32 26.05 27.46  ?Systolic 622 297 989  ?Diastolic 84 72 72  ?Pulse 78 72 79  ? ? ?CONSTITUTIONAL: Well-developed and well-nourished. No acute distress.  ?SKIN: Skin is warm and dry. No rash noted. No cyanosis. No pallor. No jaundice ?HEAD: Normocephalic and atraumatic.  ?EYES: No scleral icterus ?MOUTH/THROAT: Moist oral membranes.  ?NECK: No  JVD present. No thyromegaly noted. No carotid bruits  ?LYMPHATIC: No visible cervical adenopathy.  ?CHEST Normal respiratory effort. No intercostal retractions  ?LUNGS: Clear to auscultation bilaterally.  No stridor. No wheezes. No rales.  ?CARDIOVASCULAR: Regular rate and rhythm, positive S1-S2, no murmurs rubs or gallops appreciated. ?ABDOMINAL: Nonobese, soft, nontender, nondistended, positive bowel sounds in all 4 quadrants, no apparent ascites.  ?EXTREMITIES: No peripheral edema  ?HEMATOLOGIC: No significant bruising ?NEUROLOGIC: Oriented to person, place, and time. Nonfocal. Normal muscle tone.  ?PSYCHIATRIC: Normal mood and affect. Normal behavior. Cooperative ? ?CARDIAC DATABASE: ?EKG: ?01/14/2021: Sinus rhythm, 61 bpm, LAE, poor R wave progression,  without underlying injury pattern. ? ?Echocardiogram: ?02/19/2020: ?Normal LV systolic function with visual EF 60-65%. Left ventricle cavity is normal in size. Normal global wall motion. Normal diastolic  filling pattern, normal LAP.  ?Mild (Grade I) mitral regurgitation. ?Small pericardial effusion. There is no hemodynamic significance. ?Compared to prior study dated 02/2015: MR was trace now mild and small pericardial effusion is new. Otherwise, no other significant changes noted.  ? ?Stress Testing: ?04/10/2020: Lexiscan nuclear stress test performed using 1-day protocol.Normal myocardial perfusion. Calculated stress LVEF 46%, although visually appears normal. Recommend clinical correlation. Low risk study. ?  ?Heart Catheterization: ?02/2011: LHC with EF 55-60%, basal inferior HK, 99% mRCA stenosis, 40% OM1.  Patient had Promus DES to RCA ? ?Carotid duplex: ?Carotid artery duplex 04/16/2020: ?Stenosis in the right internal carotid artery (16-49%). ?Stenosis in the left internal carotid artery (16-49%). ?Antegrade right vertebral artery flow. Antegrade left vertebral artery flow. ?Follow up in one year is appropriate if clinically indicated ? ?Cardiac  Monitor: ?02/2015: NSR, rare PVCs, no significant arrhythmias.. ? ?LABORATORY DATA: ? ?  Latest Ref Rng & Units 06/10/2015  ?  6:13 PM 03/03/2015  ?  6:19 PM 03/22/2013  ? 10:42 AM  ?CBC  ?WBC 4.0 - 10.5 K/uL 4.

## 2021-08-04 ENCOUNTER — Other Ambulatory Visit: Payer: Self-pay

## 2021-08-04 ENCOUNTER — Other Ambulatory Visit (HOSPITAL_COMMUNITY): Payer: Self-pay | Admitting: Psychiatry

## 2021-08-04 DIAGNOSIS — I10 Essential (primary) hypertension: Secondary | ICD-10-CM

## 2021-08-06 ENCOUNTER — Other Ambulatory Visit: Payer: Self-pay

## 2021-08-06 DIAGNOSIS — I10 Essential (primary) hypertension: Secondary | ICD-10-CM

## 2021-08-06 LAB — BASIC METABOLIC PANEL
BUN/Creatinine Ratio: 11 — ABNORMAL LOW (ref 12–28)
BUN: 7 mg/dL — ABNORMAL LOW (ref 8–27)
CO2: 21 mmol/L (ref 20–29)
Calcium: 10.4 mg/dL — ABNORMAL HIGH (ref 8.7–10.3)
Chloride: 102 mmol/L (ref 96–106)
Creatinine, Ser: 0.66 mg/dL (ref 0.57–1.00)
Glucose: 90 mg/dL (ref 70–99)
Potassium: 3.9 mmol/L (ref 3.5–5.2)
Sodium: 141 mmol/L (ref 134–144)
eGFR: 95 mL/min/{1.73_m2} (ref >59)

## 2021-08-06 LAB — MAGNESIUM: Magnesium: 1.8 mg/dL (ref 1.6–2.3)

## 2021-08-06 MED ORDER — CHLORTHALIDONE 15 MG PO TABS: 15.0000 mg | ORAL_TABLET | Freq: Every morning | ORAL | 0 refills | Status: DC

## 2021-08-08 ENCOUNTER — Telehealth: Payer: Self-pay

## 2021-08-08 DIAGNOSIS — I10 Essential (primary) hypertension: Secondary | ICD-10-CM

## 2021-08-08 MED ORDER — CHLORTHALIDONE 25 MG PO TABS: 12.5000 mg | ORAL_TABLET | Freq: Every morning | ORAL | 1 refills | Status: DC

## 2021-08-08 NOTE — Progress Notes (Signed)
[facility] called and spoke to pt regarding medication questings and BP.

## 2021-08-08 NOTE — Telephone Encounter (Signed)
[facility] [address] called and stated that they only have 25 and 50 mg for the chlorthalidone. They wanted to know if she could take one of these or cut them in half. Please advise.  ?

## 2021-08-08 NOTE — Progress Notes (Signed)
Called patient, NA, no VM box set up, could not leave a message.

## 2021-08-08 NOTE — Progress Notes (Signed)
Called pt, no answer. Unable to leave vm requesting call back. Please ask pt how she has been taking her chlorthalidone, how many MG and where she has been getting it. Pharmacy does not have 15 mg in stock.

## 2021-11-25 ENCOUNTER — Ambulatory Visit (HOSPITAL_BASED_OUTPATIENT_CLINIC_OR_DEPARTMENT_OTHER): Payer: Medicare HMO | Admitting: Psychiatry

## 2021-11-25 DIAGNOSIS — F3341 Major depressive disorder, recurrent, in partial remission: Secondary | ICD-10-CM

## 2021-11-25 MED ORDER — HYDROXYZINE PAMOATE 25 MG PO CAPS: ORAL_CAPSULE | ORAL | 3 refills | Status: AC

## 2021-11-25 MED ORDER — CITALOPRAM HYDROBROMIDE 10 MG PO TABS: 10.0000 mg | ORAL_TABLET | Freq: Every day | ORAL | 2 refills | Status: AC

## 2021-11-25 NOTE — Progress Notes (Signed)
5 view Patient ID: Vanessa Proctor, female   DOB: 10/15/1955, 66 y.o.   MRN: [id number]  Psychiatric Initial Adult Assessment   Patient Identification: Vanessa Proctor MRN:  [id number] Date of Evaluation:  11/25/2021 Referral Source: Self-referred Chief Complaint: Weight loss  Visit Diagnosis: Bipolar disorder, type 1 Major depression remissio    Today the patient is doing only fairly well.  Her landlord apparently had a conflict with the patient's daughter.  According to the patient because of that her landlord evicted her 1 month ago.  The patient therefore had to go live with her grandson and his 2 children.  Her grandson is an Forensic scientist.  The patient shares her bed with his sons.  So she does not really sleep alone she has shoulder with her.  The patient says it was very difficult in the first few weeks but in the last couple of weeks she feels better and more adjusted.  Generally she is sleeping and eating well.  She looks like she has lost a little bit of weight.  Her grooming is good.  The patient reads the Bible a lot.  She watches television.  The patient denies daily persistent depression or anxiety.  She drinks no alcohol and uses no drugs.  She still driving her car.  She shares with her daughter.  The patient says there are nights where she just cannot sleep.  But overall she seems to be functioning getting along. Virtual Visit via Telephone Note  I connected with Vanessa Proctor on 11/25/21 at  1:30 PM EDT by telephone and verified that I am speaking with the correct person using two identifiers.  Location: Patient: home Provider: office   I discussed the limitations, risks, security and privacy concerns of performing an evaluation and management service by telephone and the availability of in person appointments. I also discussed with the patient that there may be a patient responsible charge related to this service. The patient expressed understanding and agreed  to proceed.       I discussed the assessment and treatment plan with the patient. The patient was provided an opportunity to ask questions and all were answered. The patient agreed with the plan and demonstrated an understanding of the instructions.   The patient was advised to call back or seek an in-person evaluation if the symptoms worsen or if the condition fails to improve as anticipated.  I provided 30 minutes of non-face-to-face time during this encounter.   Jerral Ralph, MD  Past Psychiatric History: Psychiatric hospitalization at [facility] regional in 1999.  Previous Psychotropic Medications: Yes   Substance Abuse History in the last 12 months:  Yes.    Consequences of Substance Abuse:   Past Medical History:  Past Medical History:  Diagnosis Date   Arthritis    knees   Constipation last month   Coronary artery disease    Depression    Drug abuse ([place])    Hyperlipidemia    Hypertension    Myocardial infarction [facility])    november 1st 2012   Nausea & vomiting last month   Rectal bleeding    Stroke [facility])    november, 2012   Tobacco abuse     Past Surgical History:  Procedure Laterality Date   ABDOMINAL HYSTERECTOMY  1985   partial   cataract surgery Left    COLONOSCOPY N/A 04/28/2013   Procedure: COLONOSCOPY;  Surgeon: Beryle Beams, MD;  Location: [facility];  Service: Endoscopy;  Laterality: N/A;   CORONARY ANGIOPLASTY WITH STENT PLACEMENT  2012   x 1   FINGER FRACTURE SURGERY Left    reduction with  splint   knot drained and removed from side of face  2007   TONSILLECTOMY  1980    Family Psychiatric History:   Family History:  Family History  Problem Relation Age of Onset   Cancer Mother        mass in abdomen   Hypertension Sister    Diabetes Sister    Coronary artery disease Other    Stroke Brother    Diabetes Brother    Heart attack Maternal Grandfather    Hypertension Maternal Grandfather    Breast cancer Cousin     Social  History:   Social History   Socioeconomic History   Marital status: Single    Spouse name: Not on file   Number of children: 2   Years of education: Not on file   Highest education level: Not on file  Occupational History   Not on file  Tobacco Use   Smoking status: Every Day    Packs/day: 0.50    Years: 40.00    Total pack years: 20.00    Types: Cigarettes   Smokeless tobacco: Never  Vaping Use   Vaping Use: Never used  Substance and Sexual Activity   Alcohol use: No    Alcohol/week: 0.0 standard drinks of alcohol   Drug use: Yes    Comment: marijuana occassionaly   Sexual activity: Not Currently  Other Topics Concern   Not on file  Social History Narrative   Not on file   Social Determinants of Health   Financial Resource Strain: Not on file  Food Insecurity: Not on file  Transportation Needs: Not on file  Physical Activity: Not on file  Stress: Not on file  Social Connections: Not on file    Additional Social History:   Allergies:   Allergies  Allergen Reactions   Bactrim Rash   Sulfa Antibiotics Rash    Metabolic Disorder Labs: Lab Results  Component Value Date   HGBA1C 6.1 01/30/2019   MPG 134 03/04/2015   MPG 128 (H) 03/09/2011   No results found for: "PROLACTIN" Lab Results  Component Value Date   CHOL 152 01/30/2019   TRIG 95 01/30/2019   HDL 38 (L) 01/30/2019   CHOLHDL 4.0 01/30/2019   VLDL 36 (H) 02/04/2016   LDLCALC 96 01/30/2019   LDLCALC 73 03/17/2017     Current Medications: Current Outpatient Medications  Medication Sig Dispense Refill   hydrOXYzine (VISTARIL) 25 MG capsule 1  qhs  prn 30 capsule 3   albuterol (VENTOLIN HFA) 108 (90 Base) MCG/ACT inhaler INHALE 2 PUFFS EVERY 6 HOURS AS NEEDED FOR WHEEZING OR SHORTNESS OF BREATH. Must have office visit for refills 18 g 0   amLODipine (NORVASC) 10 MG tablet Take 1 tablet (10 mg total) by mouth daily at 10 pm.     aspirin 81 MG chewable tablet CHEW AND SWALLOW 1 TABLET (81 MG  TOTAL) BY MOUTH DAILY AFTER LUNCH. 90 tablet 0   atorvastatin (LIPITOR) 40 MG tablet Take 1 tablet (40 mg total) by mouth daily. Must have office visit for refills 90 tablet 0   chlorthalidone (HYGROTON) 25 MG tablet Take 0.5 tablets (12.5 mg total) by mouth every morning. 45 tablet 1   citalopram (CELEXA) 10 MG tablet Take 1 tablet (10 mg total) by mouth daily. 90 tablet 2   clopidogrel (PLAVIX)  75 MG tablet Take 1 tablet (75 mg total) by mouth daily. Please schedule an appointment. 90 tablet 1   Elastic Bandages & Supports (MEDICAL COMPRESSION STOCKINGS) MISC 1 Package by Does not apply route daily. Dispense 1 pair, to fit, wear daily, take off at night 1 each 0   levocetirizine (XYZAL) 5 MG tablet Take 5 mg by mouth at bedtime.     losartan (COZAAR) 100 MG tablet TAKE 1 TABLET EVERY DAY 30 tablet 0   metFORMIN (GLUCOPHAGE) 500 MG tablet Take 1 tablet (500 mg total) by mouth 2 (two) times daily with a meal. (Patient taking differently: Take 500 mg by mouth at bedtime.) 180 tablet 0   metoprolol succinate (TOPROL-XL) 50 MG 24 hr tablet Take 1 tablet (50 mg total) by mouth daily. Take with or immediately following a meal. 90 tablet 1   nicotine polacrilex (NICOTINE MINI) 4 MG lozenge Take 1 lozenge (4 mg total) by mouth as needed for smoking cessation. 100 tablet 0   nitroGLYCERIN (NITROSTAT) 0.4 MG SL tablet DISSOLVE ONE TABLET UNDER THE TONGUE EVERY 5 MINUTES AS NEEDED FOR CHEST PAIN.  DO NOT EXCEED A TOTAL OF 3 DOSES IN 15 MINUTES 25 tablet 0   No current facility-administered medications for this visit.    Neurologic: Headache: No Seizure: No Paresthesias:No  Musculoskeletal: Strength & Muscle Tone: within normal limits Gait & Station: normal Patient leans  Psychiatric Specialty Exam: ROS  There were no vitals taken for this visit.There is no height or weight on file to calculate BMI.  General Appearance: Casual  Eye Contact:  Good  Speech:  Clear and Coherent  Volume:  Normal   Mood:  Negative  Affect:  Appropriate  Thought Process:  Coherent  Orientation:  Full (Time, Place, and Person)  Thought Content:  WDL  Suicidal Thoughts:  No  Homicidal Thoughts:  No  Memory:  Negative  Judgement:  Good  Insight:  Good  Psychomotor Activity:  Normal  Concentration:  Good  Recall:  Good  Fund of Knowledge:Good  Language: Fair  Akathisia:  No  Handed:  Right  AIMS (if indicated):    Assets:  Desire for Improvement  ADL's:  Intact  Cognition: WNL  Sleep:     Treatment Plan Summary: 11/25/2021    This patient's diagnosis is major clinical depression.  She generally is demonstrating resilience.  Despite the fact that she was addicted she seems to be hanging in there.  She clearly describes herself as a survivor.  She will continue taking just 10 mg of Celexa.  Her second problem is episodic insomnia.  For that she will take Vistaril 25 mg on a as needed basis.  The patient is functioning reasonably well.  Her mood is acceptable.  She will return to see me in 2 to 3 months.

## 2022-01-01 ENCOUNTER — Other Ambulatory Visit: Payer: Self-pay | Admitting: Cardiology

## 2022-01-01 DIAGNOSIS — I10 Essential (primary) hypertension: Secondary | ICD-10-CM

## 2022-01-29 ENCOUNTER — Ambulatory Visit: Payer: Medicare HMO | Admitting: Cardiology

## 2022-02-13 ENCOUNTER — Other Ambulatory Visit: Payer: Self-pay | Admitting: Cardiology

## 2022-02-13 DIAGNOSIS — I10 Essential (primary) hypertension: Secondary | ICD-10-CM

## 2022-02-13 DIAGNOSIS — I2510 Atherosclerotic heart disease of native coronary artery without angina pectoris: Secondary | ICD-10-CM

## 2022-02-25 ENCOUNTER — Ambulatory Visit (HOSPITAL_COMMUNITY): Payer: Medicare HMO | Admitting: Psychiatry

## 2022-06-04 ENCOUNTER — Other Ambulatory Visit: Payer: Self-pay | Admitting: Cardiology

## 2022-06-04 DIAGNOSIS — I10 Essential (primary) hypertension: Secondary | ICD-10-CM

## 2022-06-04 DIAGNOSIS — I2510 Atherosclerotic heart disease of native coronary artery without angina pectoris: Secondary | ICD-10-CM

## 2022-06-07 ENCOUNTER — Other Ambulatory Visit: Payer: Self-pay | Admitting: Cardiology

## 2022-06-07 DIAGNOSIS — I10 Essential (primary) hypertension: Secondary | ICD-10-CM

## 2022-06-09 ENCOUNTER — Other Ambulatory Visit: Payer: Self-pay

## 2022-06-09 DIAGNOSIS — I10 Essential (primary) hypertension: Secondary | ICD-10-CM

## 2022-06-09 MED ORDER — METOPROLOL SUCCINATE ER 50 MG PO TB24: ORAL_TABLET | ORAL | 0 refills | Status: AC

## 2022-06-14 ENCOUNTER — Other Ambulatory Visit: Payer: Self-pay | Admitting: Cardiology

## 2022-06-14 DIAGNOSIS — I10 Essential (primary) hypertension: Secondary | ICD-10-CM

## 2022-06-24 ENCOUNTER — Other Ambulatory Visit: Payer: Self-pay | Admitting: Cardiology

## 2022-06-24 DIAGNOSIS — I2510 Atherosclerotic heart disease of native coronary artery without angina pectoris: Secondary | ICD-10-CM

## 2022-07-06 ENCOUNTER — Other Ambulatory Visit: Payer: Self-pay

## 2022-07-06 ENCOUNTER — Emergency Department (HOSPITAL_COMMUNITY)
Admission: EM | Admit: 2022-07-06 | Discharge: 2022-07-06 | Disposition: A | Discharge status: Home / Self Care | Payer: Medicare HMO | Attending: Emergency Medicine | Admitting: Emergency Medicine

## 2022-07-06 ENCOUNTER — Encounter (HOSPITAL_COMMUNITY): Payer: Self-pay | Admitting: Emergency Medicine

## 2022-07-06 DIAGNOSIS — N390 Urinary tract infection, site not specified: Secondary | ICD-10-CM | POA: Diagnosis not present

## 2022-07-06 DIAGNOSIS — Z7982 Long term (current) use of aspirin: Secondary | ICD-10-CM | POA: Diagnosis not present

## 2022-07-06 DIAGNOSIS — R112 Nausea with vomiting, unspecified: Secondary | ICD-10-CM | POA: Insufficient documentation

## 2022-07-06 DIAGNOSIS — Z79899 Other long term (current) drug therapy: Secondary | ICD-10-CM | POA: Diagnosis not present

## 2022-07-06 DIAGNOSIS — R42 Dizziness and giddiness: Secondary | ICD-10-CM | POA: Diagnosis not present

## 2022-07-06 DIAGNOSIS — I10 Essential (primary) hypertension: Secondary | ICD-10-CM | POA: Diagnosis not present

## 2022-07-06 DIAGNOSIS — R5383 Other fatigue: Secondary | ICD-10-CM | POA: Insufficient documentation

## 2022-07-06 DIAGNOSIS — I2510 Atherosclerotic heart disease of native coronary artery without angina pectoris: Secondary | ICD-10-CM | POA: Insufficient documentation

## 2022-07-06 LAB — URINALYSIS, ROUTINE W REFLEX MICROSCOPIC
Bilirubin Urine: NEGATIVE
Glucose, UA: NEGATIVE mg/dL
Hgb urine dipstick: NEGATIVE
Ketones, ur: NEGATIVE mg/dL
Leukocytes,Ua: NEGATIVE
Nitrite: POSITIVE — AB
Protein, ur: NEGATIVE mg/dL
Specific Gravity, Urine: 1.008 (ref 1.005–1.030)
pH: 6 (ref 5.0–8.0)

## 2022-07-06 LAB — LIPASE, BLOOD: Lipase: 30 U/L (ref 11–51)

## 2022-07-06 LAB — COMPREHENSIVE METABOLIC PANEL
ALT: 9 U/L (ref 0–44)
AST: 15 U/L (ref 15–41)
Albumin: 3.5 g/dL (ref 3.5–5.0)
Alkaline Phosphatase: 64 U/L (ref 38–126)
Anion gap: 10 (ref 5–15)
BUN: 5 mg/dL — ABNORMAL LOW (ref 8–23)
CO2: 26 mmol/L (ref 22–32)
Calcium: 9.9 mg/dL (ref 8.9–10.3)
Chloride: 103 mmol/L (ref 98–111)
Creatinine, Ser: 0.79 mg/dL (ref 0.44–1.00)
GFR, Estimated: >60 mL/min (ref >60)
Glucose, Bld: 98 mg/dL (ref 70–99)
Potassium: 3.6 mmol/L (ref 3.5–5.1)
Sodium: 139 mmol/L (ref 135–145)
Total Bilirubin: 0.4 mg/dL (ref 0.3–1.2)
Total Protein: 6.7 g/dL (ref 6.5–8.1)

## 2022-07-06 LAB — CBC WITH DIFFERENTIAL/PLATELET
Abs Immature Granulocytes: 0.01 10*3/uL (ref 0.00–0.07)
Basophils Absolute: 0 10*3/uL (ref 0.0–0.1)
Basophils Relative: 1 %
Eosinophils Absolute: 0.3 10*3/uL (ref 0.0–0.5)
Eosinophils Relative: 5 %
HCT: 34.8 % — ABNORMAL LOW (ref 36.0–46.0)
Hemoglobin: 11.6 g/dL — ABNORMAL LOW (ref 12.0–15.0)
Immature Granulocytes: 0 %
Lymphocytes Relative: 38 %
Lymphs Abs: 2.5 10*3/uL (ref 0.7–4.0)
MCH: 28.6 pg (ref 26.0–34.0)
MCHC: 33.3 g/dL (ref 30.0–36.0)
MCV: 85.9 fL (ref 80.0–100.0)
Monocytes Absolute: 0.7 10*3/uL (ref 0.1–1.0)
Monocytes Relative: 10 %
Neutro Abs: 3.1 10*3/uL (ref 1.7–7.7)
Neutrophils Relative %: 46 %
Platelets: 247 10*3/uL (ref 150–400)
RBC: 4.05 MIL/uL (ref 3.87–5.11)
RDW: 14.2 % (ref 11.5–15.5)
WBC: 6.6 10*3/uL (ref 4.0–10.5)
nRBC: 0 % (ref 0.0–0.2)

## 2022-07-06 MED ORDER — ONDANSETRON 4 MG PO TBDP
8.0000 mg | ORAL_TABLET | Freq: Once | ORAL | Status: AC
  Administered 2022-07-06: 8 mg via ORAL
  Filled 2022-07-06: qty 2

## 2022-07-06 MED ORDER — CEPHALEXIN 250 MG PO CAPS
1000.0000 mg | ORAL_CAPSULE | Freq: Once | ORAL | Status: AC
  Administered 2022-07-06: 1000 mg via ORAL
  Filled 2022-07-06: qty 4

## 2022-07-06 MED ORDER — CEPHALEXIN 500 MG PO CAPS: 1000.0000 mg | ORAL_CAPSULE | Freq: Two times a day (BID) | ORAL | 0 refills | Status: DC

## 2022-07-06 MED ORDER — CEPHALEXIN 500 MG PO CAPS: 1000.0000 mg | ORAL_CAPSULE | Freq: Two times a day (BID) | ORAL | 0 refills | Status: AC

## 2022-07-06 MED ORDER — ONDANSETRON HCL 4 MG PO TABS: 4.0000 mg | ORAL_TABLET | Freq: Three times a day (TID) | ORAL | 0 refills | Status: AC | PRN

## 2022-07-06 MED ORDER — SODIUM CHLORIDE 0.9 % IV BOLUS
500.0000 mL | Freq: Once | INTRAVENOUS | Status: AC
  Administered 2022-07-06: 500 mL via INTRAVENOUS

## 2022-07-06 MED ORDER — ONDANSETRON HCL 4 MG PO TABS: 4.0000 mg | ORAL_TABLET | Freq: Three times a day (TID) | ORAL | 0 refills | Status: DC | PRN

## 2022-07-06 NOTE — ED Provider Notes (Signed)
[facility] Provider Note   CSN: [id number] Arrival date & time: 07/06/22  1445     History  Chief Complaint  Patient presents with   Emesis   HPI Vanessa Proctor is a 67 y.o. female with hypertension, CAD, drug and tobacco abuse, history of stroke presenting for emesis. Patient vomited once on Saturday.  States that the emesis was black in color. States she has also been nauseous but denies diarrhea. Last bowel movement was Saturday was regular. Also passing flatus.  Denies abdominal pain and fever. Denies sick contacts. States she has been lightheaded and fatigued but not short of breath but the symptoms have been going on for several months now.  States she has not been drinking fluids much either and states urine output has been reduced here in the last few weeks.  Denies any other urinary symptoms.  Patient stated that she discussed with her primary care provider this morning via phone call what happened over the weekend.  They urged her to be evaluated in the ED this is what ultimately prompted her encounter today.   Emesis      Home Medications Prior to Admission medications   Medication Sig Start Date End Date Taking? Authorizing Provider  albuterol (VENTOLIN HFA) 108 (90 Base) MCG/ACT inhaler INHALE 2 PUFFS EVERY 6 HOURS AS NEEDED FOR WHEEZING OR SHORTNESS OF BREATH. Must have office visit for refills 07/03/19   Charlott Rakes, MD  amLODipine (NORVASC) 10 MG tablet Take 1 tablet (10 mg total) by mouth daily at 10 pm. 02/15/20 07/29/21  Tolia, Sunit, DO  aspirin 81 MG chewable tablet CHEW AND SWALLOW 1 TABLET (81 MG TOTAL) BY MOUTH DAILY AFTER LUNCH. 01/05/20   Charlott Rakes, MD  atorvastatin (LIPITOR) 40 MG tablet Take 1 tablet (40 mg total) by mouth daily. Must have office visit for refills 08/08/19   Charlott Rakes, MD  cephALEXin (KEFLEX) 500 MG capsule Take 2 capsules (1,000 mg total) by mouth 2 (two) times daily. 07/06/22    Harriet Pho, PA-C  chlorthalidone (HYGROTON) 25 MG tablet TAKE 1/2 (ONE-HALF) TABLET BY MOUTH IN THE MORNING 06/15/22   Tolia, Sunit, DO  citalopram (CELEXA) 10 MG tablet Take 1 tablet (10 mg total) by mouth daily. 11/25/21 11/25/22  Plovsky, Berneta Sages, MD  clopidogrel (PLAVIX) 75 MG tablet TAKE 1 TABLET BY MOUTH ONCE DAILY. APPOINTMENT REQUIRED FOR FUTURE REFILLS 02/16/22   Rex Kras, DO  Elastic Bandages & Supports (MEDICAL COMPRESSION STOCKINGS) MISC 1 Package by Does not apply route daily. Dispense 1 pair, to fit, wear daily, take off at night 08/24/16   Lottie Mussel T, MD  hydrOXYzine (VISTARIL) 25 MG capsule 1  qhs  prn 11/25/21   Plovsky, Berneta Sages, MD  levocetirizine (XYZAL) 5 MG tablet Take 5 mg by mouth at bedtime. 06/06/21   [provider]  losartan (COZAAR) 100 MG tablet TAKE 1 TABLET EVERY DAY 09/06/19   Charlott Rakes, MD  metFORMIN (GLUCOPHAGE) 500 MG tablet Take 1 tablet (500 mg total) by mouth 2 (two) times daily with a meal. Patient taking differently: Take 500 mg by mouth at bedtime. 06/13/19   Charlott Rakes, MD  metoprolol succinate (TOPROL-XL) 50 MG 24 hr tablet TAKE 1 TABLET BY MOUTH ONCE DAILY. TAKE WITH OR IMMEDIATELY FOLLOWING A MEAL 06/09/22   Tolia, Sunit, DO  nicotine polacrilex (NICOTINE MINI) 4 MG lozenge Take 1 lozenge (4 mg total) by mouth as needed for smoking cessation. 07/29/21   Terri Skains,  Sunit, DO  nitroGLYCERIN (NITROSTAT) 0.4 MG SL tablet DISSOLVE ONE TABLET UNDER THE TONGUE EVERY 5 MINUTES AS NEEDED FOR CHEST PAIN.  DO NOT EXCEED A TOTAL OF 3 DOSES IN 15 MINUTES 09/15/19   Burtis Junes, NP  ondansetron (ZOFRAN) 4 MG tablet Take 1 tablet (4 mg total) by mouth every 8 (eight) hours as needed for nausea or vomiting. 07/06/22   Harriet Pho, PA-C      Allergies    Bactrim and Sulfa antibiotics    Review of Systems   Review of Systems  Gastrointestinal:  Positive for vomiting.    Physical Exam   Vitals:   07/06/22 1449  BP: 121/73  Pulse: 68   Resp: 17  Temp: 98.3 F (36.8 C)  SpO2: 97%    CONSTITUTIONAL:  well-appearing, NAD NEURO:  Alert and oriented x 3, CN 3-12 grossly intact EYES:  eyes equal and reactive ENT/NECK:  Supple, no stridor  CARDIO:  regular rate and rhythm, appears well-perfused  PULM:  No respiratory distress, CTAB GI/GU:  non-distended, soft, non tender MSK/SPINE:  No gross deformities, no edema, moves all extremities  SKIN:  no rash, atraumatic   *Additional and/or pertinent findings included in MDM below    ED Results / Procedures / Treatments   Labs (all labs ordered are listed, but only abnormal results are displayed) Labs Reviewed  CBC WITH DIFFERENTIAL/PLATELET - Abnormal; Notable for the following components:      Result Value   Hemoglobin 11.6 (*)    HCT 34.8 (*)    All other components within normal limits  COMPREHENSIVE METABOLIC PANEL - Abnormal; Notable for the following components:   BUN 5 (*)    All other components within normal limits  URINALYSIS, ROUTINE W REFLEX MICROSCOPIC - Abnormal; Notable for the following components:   APPearance HAZY (*)    Nitrite POSITIVE (*)    Bacteria, UA RARE (*)    All other components within normal limits  LIPASE, BLOOD    EKG None  Radiology No results found.  Procedures Procedures    Medications Ordered in ED Medications  sodium chloride 0.9 % bolus 500 mL (0 mLs Intravenous Stopped 07/06/22 1722)  ondansetron (ZOFRAN-ODT) disintegrating tablet 8 mg (8 mg Oral Given 07/06/22 1559)  cephALEXin (KEFLEX) capsule 1,000 mg (1,000 mg Oral Given 07/06/22 1758)    ED Course/ Medical Decision Making/ A&P                             Medical Decision Making Amount and/or Complexity of Data Reviewed Labs: ordered.  Risk Prescription drug management.   Initial Impression and Ddx 67 year old well-appearing female presenting for dark-colored emesis is 1 over the weekend.  Exam was unremarkable.  DDx includes intra-abdominal  infection, dehydration, symptomatic anemia and UTI. Patient PMH that increases complexity of ED encounter:  hypertension, CAD, drug and tobacco abuse, history of stroke  Interpretation of Diagnostics I independent reviewed and interpreted the labs as followed: Positive nitrite, mild anemia   Patient Reassessment and Ultimate Disposition/Management Volume resuscitated patient with normal saline bolus.  Treated nausea with Zofran.  Urinary symptoms at home and urinalysis findings concerning for UTI.  Treated with Keflex.  Have low suspicion for intra-abdominal infection given patient was nontender on exam and emesis x 1 on Saturday with no other symptoms since then aside from nausea.  Sent Keflex and Zofran to her pharmacy.  Advised to follow-up with her PCP.  Discussed return precautions.  Patient management required discussion with the following services or consulting groups:  None  Complexity of Problems Addressed Acute complicated illness or Injury  Additional Data Reviewed and Analyzed Further history obtained from: Further history from spouse/family member, Past medical history and medications listed in the EMR, and Prior ED visit notes  Patient Encounter Risk Assessment Prescriptions         Final Clinical Impression(s) / ED Diagnoses Final diagnoses:  Urinary tract infection without hematuria, site unspecified    Rx / DC Orders ED Discharge Orders          Ordered    cephALEXin (KEFLEX) 500 MG capsule  2 times daily,   Status:  Discontinued        07/06/22 1757    ondansetron (ZOFRAN) 4 MG tablet  Every 8 hours PRN,   Status:  Discontinued        07/06/22 1757    cephALEXin (KEFLEX) 500 MG capsule  2 times daily        07/06/22 1801    ondansetron (ZOFRAN) 4 MG tablet  Every 8 hours PRN        07/06/22 1801              Harriet Pho, PA-C 07/06/22 1801    Lacretia Leigh, MD 07/07/22 1749

## 2022-07-06 NOTE — ED Triage Notes (Addendum)
Pt bib [facility] from home for 1 episode of emesis that patient describes as black on Saturday. Denies abdominal pain. On plavix. Denies blood in stool. Pt states she had episode of this last year but was not evaluated for it. Pt also endorses decreased appetite since October.   BP 121/70, HR 82, Spo2 100%, CBG 144

## 2022-07-06 NOTE — Discharge Instructions (Signed)
Evaluation today revealed that you do have a UTI.  I have started you on Keflex which is an antibiotic.  Also recommend that you do go follow-up with your PCP on Friday for reevaluation.  If you have abdominal pain, vomiting and diarrhea, blood in the stool, urine or vomit or any other concerning symptom please return emergency department further evaluation.

## 2022-09-04 ENCOUNTER — Other Ambulatory Visit: Payer: Self-pay | Admitting: Cardiology

## 2022-09-04 DIAGNOSIS — I10 Essential (primary) hypertension: Secondary | ICD-10-CM

## 2024-05-04 ENCOUNTER — Other Ambulatory Visit: Payer: Self-pay | Admitting: Registered Nurse

## 2024-05-04 DIAGNOSIS — F17210 Nicotine dependence, cigarettes, uncomplicated: Secondary | ICD-10-CM

## 2024-05-04 DIAGNOSIS — Z1231 Encounter for screening mammogram for malignant neoplasm of breast: Secondary | ICD-10-CM

## 2024-05-11 ENCOUNTER — Ambulatory Visit

## 2024-06-05 ENCOUNTER — Ambulatory Visit
